# Patient Record
Sex: Female | Born: 1990 | Race: White | Hispanic: No | Marital: Single | Smoking: Current every day smoker
Health system: Southern US, Community
[De-identification: ages and names within clinical notes are randomized; demographics above are authoritative.]

## PROBLEM LIST (undated history)

## (undated) ENCOUNTER — Inpatient Hospital Stay (HOSPITAL_COMMUNITY): Payer: Self-pay

## (undated) DIAGNOSIS — IMO0002 Reserved for concepts with insufficient information to code with codable children: Secondary | ICD-10-CM

## (undated) DIAGNOSIS — N151 Renal and perinephric abscess: Secondary | ICD-10-CM

## (undated) DIAGNOSIS — R809 Proteinuria, unspecified: Secondary | ICD-10-CM

## (undated) DIAGNOSIS — R569 Unspecified convulsions: Secondary | ICD-10-CM

## (undated) DIAGNOSIS — R87619 Unspecified abnormal cytological findings in specimens from cervix uteri: Secondary | ICD-10-CM

## (undated) DIAGNOSIS — N83209 Unspecified ovarian cyst, unspecified side: Secondary | ICD-10-CM

## (undated) DIAGNOSIS — N3010 Interstitial cystitis (chronic) without hematuria: Secondary | ICD-10-CM

## (undated) DIAGNOSIS — O2340 Unspecified infection of urinary tract in pregnancy, unspecified trimester: Secondary | ICD-10-CM

## (undated) DIAGNOSIS — B951 Streptococcus, group B, as the cause of diseases classified elsewhere: Secondary | ICD-10-CM

## (undated) HISTORY — DX: Unspecified convulsions: R56.9

## (undated) HISTORY — DX: Unspecified infection of urinary tract in pregnancy, unspecified trimester: O23.40

## (undated) HISTORY — DX: Interstitial cystitis (chronic) without hematuria: N30.10

## (undated) HISTORY — PX: DILATION AND CURETTAGE OF UTERUS: SHX78

## (undated) HISTORY — DX: Streptococcus, group b, as the cause of diseases classified elsewhere: B95.1

## (undated) NOTE — *Deleted (*Deleted)
Pt agitated, running around, wanted to go to use commode in rest room,, In the rest room, adamant to take shower, with EEGs on. Convince pt tp go back to the bed. Talking non sensible things... want to video call. Not able to communicate to whom,, some video night.  Given ativan. Monitoring the pt closely.

---
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
## 2008-01-06 ENCOUNTER — Emergency Department (HOSPITAL_COMMUNITY): Admission: EM | Admit: 2008-01-06 | Discharge: 2008-01-06 | Payer: Self-pay | Admitting: Emergency Medicine

## 2008-05-04 ENCOUNTER — Emergency Department (HOSPITAL_COMMUNITY): Admission: EM | Admit: 2008-05-04 | Discharge: 2008-05-04 | Payer: Self-pay | Admitting: Emergency Medicine

## 2008-07-31 ENCOUNTER — Telehealth: Payer: Self-pay | Admitting: Sports Medicine

## 2008-07-31 ENCOUNTER — Telehealth: Payer: Self-pay | Admitting: *Deleted

## 2008-07-31 ENCOUNTER — Encounter: Payer: Self-pay | Admitting: Obstetrics & Gynecology

## 2008-07-31 ENCOUNTER — Ambulatory Visit (HOSPITAL_COMMUNITY): Admission: RE | Admit: 2008-07-31 | Discharge: 2008-07-31 | Payer: Self-pay | Admitting: Obstetrics & Gynecology

## 2008-10-24 ENCOUNTER — Emergency Department (HOSPITAL_COMMUNITY): Admission: EM | Admit: 2008-10-24 | Discharge: 2008-10-24 | Payer: Self-pay | Admitting: Emergency Medicine

## 2008-10-31 ENCOUNTER — Emergency Department (HOSPITAL_COMMUNITY): Admission: EM | Admit: 2008-10-31 | Discharge: 2008-10-31 | Payer: Self-pay | Admitting: Emergency Medicine

## 2008-11-06 ENCOUNTER — Telehealth: Payer: Self-pay | Admitting: Sports Medicine

## 2008-11-11 ENCOUNTER — Emergency Department (HOSPITAL_COMMUNITY): Admission: EM | Admit: 2008-11-11 | Discharge: 2008-11-11 | Payer: Self-pay | Admitting: Emergency Medicine

## 2008-12-17 ENCOUNTER — Telehealth: Payer: Self-pay | Admitting: Sports Medicine

## 2008-12-25 ENCOUNTER — Emergency Department (HOSPITAL_COMMUNITY): Admission: EM | Admit: 2008-12-25 | Discharge: 2008-12-25 | Payer: Self-pay | Admitting: Emergency Medicine

## 2009-01-21 ENCOUNTER — Emergency Department (HOSPITAL_COMMUNITY): Admission: EM | Admit: 2009-01-21 | Discharge: 2009-01-21 | Payer: Self-pay | Admitting: Emergency Medicine

## 2009-02-06 ENCOUNTER — Emergency Department (HOSPITAL_COMMUNITY): Admission: EM | Admit: 2009-02-06 | Discharge: 2009-02-06 | Payer: Self-pay | Admitting: Emergency Medicine

## 2009-02-27 ENCOUNTER — Emergency Department (HOSPITAL_COMMUNITY): Admission: EM | Admit: 2009-02-27 | Discharge: 2009-02-27 | Payer: Self-pay | Admitting: Emergency Medicine

## 2009-02-28 ENCOUNTER — Emergency Department (HOSPITAL_COMMUNITY): Admission: EM | Admit: 2009-02-28 | Discharge: 2009-02-28 | Payer: Self-pay | Admitting: Emergency Medicine

## 2009-03-07 ENCOUNTER — Emergency Department (HOSPITAL_COMMUNITY): Admission: EM | Admit: 2009-03-07 | Discharge: 2009-03-07 | Payer: Self-pay | Admitting: Emergency Medicine

## 2009-03-07 ENCOUNTER — Emergency Department (HOSPITAL_COMMUNITY): Admission: EM | Admit: 2009-03-07 | Discharge: 2009-03-08 | Payer: Self-pay | Admitting: Emergency Medicine

## 2009-03-20 ENCOUNTER — Encounter: Admission: RE | Admit: 2009-03-20 | Discharge: 2009-03-20 | Payer: Self-pay | Admitting: Obstetrics & Gynecology

## 2009-03-24 ENCOUNTER — Telehealth (INDEPENDENT_AMBULATORY_CARE_PROVIDER_SITE_OTHER): Payer: Self-pay | Admitting: *Deleted

## 2009-03-26 ENCOUNTER — Emergency Department (HOSPITAL_COMMUNITY): Admission: EM | Admit: 2009-03-26 | Discharge: 2009-03-26 | Payer: Self-pay | Admitting: Emergency Medicine

## 2009-04-10 ENCOUNTER — Emergency Department (HOSPITAL_COMMUNITY): Admission: EM | Admit: 2009-04-10 | Discharge: 2009-04-10 | Payer: Self-pay | Admitting: Emergency Medicine

## 2009-04-18 ENCOUNTER — Emergency Department (HOSPITAL_COMMUNITY): Admission: EM | Admit: 2009-04-18 | Discharge: 2009-04-18 | Payer: Self-pay | Admitting: Emergency Medicine

## 2009-05-05 ENCOUNTER — Emergency Department (HOSPITAL_COMMUNITY): Admission: EM | Admit: 2009-05-05 | Discharge: 2009-05-05 | Payer: Self-pay | Admitting: Emergency Medicine

## 2009-05-13 ENCOUNTER — Telehealth: Payer: Self-pay | Admitting: Sports Medicine

## 2009-05-19 ENCOUNTER — Emergency Department (HOSPITAL_COMMUNITY): Admission: EM | Admit: 2009-05-19 | Discharge: 2009-05-19 | Payer: Self-pay | Admitting: Emergency Medicine

## 2009-05-22 ENCOUNTER — Emergency Department (HOSPITAL_COMMUNITY): Admission: EM | Admit: 2009-05-22 | Discharge: 2009-05-22 | Payer: Self-pay | Admitting: Emergency Medicine

## 2009-06-05 ENCOUNTER — Emergency Department (HOSPITAL_COMMUNITY): Admission: EM | Admit: 2009-06-05 | Discharge: 2009-06-05 | Payer: Self-pay | Admitting: Emergency Medicine

## 2009-06-08 ENCOUNTER — Emergency Department (HOSPITAL_COMMUNITY): Admission: EM | Admit: 2009-06-08 | Discharge: 2009-06-09 | Payer: Self-pay | Admitting: Emergency Medicine

## 2009-07-04 ENCOUNTER — Emergency Department (HOSPITAL_COMMUNITY): Admission: EM | Admit: 2009-07-04 | Discharge: 2009-07-04 | Payer: Self-pay | Admitting: Emergency Medicine

## 2009-07-17 ENCOUNTER — Emergency Department: Payer: Self-pay | Admitting: Emergency Medicine

## 2009-08-13 ENCOUNTER — Emergency Department: Payer: Self-pay | Admitting: Emergency Medicine

## 2009-08-17 ENCOUNTER — Emergency Department (HOSPITAL_COMMUNITY): Admission: EM | Admit: 2009-08-17 | Discharge: 2009-08-17 | Payer: Self-pay | Admitting: Emergency Medicine

## 2009-09-12 ENCOUNTER — Emergency Department: Payer: Self-pay | Admitting: Emergency Medicine

## 2009-10-07 ENCOUNTER — Emergency Department: Payer: Self-pay | Admitting: Emergency Medicine

## 2009-12-04 ENCOUNTER — Emergency Department: Payer: Self-pay | Admitting: Emergency Medicine

## 2009-12-12 ENCOUNTER — Emergency Department (HOSPITAL_COMMUNITY): Admission: EM | Admit: 2009-12-12 | Discharge: 2009-12-13 | Payer: Self-pay | Admitting: Emergency Medicine

## 2010-04-06 NOTE — Progress Notes (Signed)
 Summary: refill  Phone Note Call from Patient Call back at Home Phone [phone]   Caller: Patient Summary of Call: pt needs Hydrocordisone cream for her flair up of her skin prob on eye. [facility] [address] Initial call taken by: Karna Seminole,  November 06, 2008 11:19 AM  Follow-up for Phone Call        sent to pcp Follow-up by: Ginnie Mau RN,  November 06, 2008 11:27 AM  Additional Follow-up for Phone Call Additional follow up Details #1::        I need to see her before approving this refill, may have to change medication.  She could see me Tuesday. Additional Follow-up by: Debby Petties MD,  November 06, 2008 8:59 PM     Appended Document: refill gave her number to [facility]. unable to pay for visit. states she will wait to see if she qualifies  Appended Document: refill I will refill a small tube but she needs to go through [place] and come to see me when she is certified. -Dr. ONEIDA.   Prescriptions: HYDROCORTISONE 2.5 % CREA (HYDROCORTISONE) Apply to affected area two times a day and continue one week after resolution of symptoms.  #15g tube x 0   Entered and Authorized by:   Debby Petties MD   Signed by:   Debby Petties MD on 11/07/2008   Method used:   [facility]        [facility]  [address] [phone]* (retail)       [address]       [place], [place]  [address]       Ph: [phone]       Fax: [phone]   RxID:   [id number]     Appended Document: refill pt notified

## 2010-04-06 NOTE — Progress Notes (Signed)
Summary: Rx Prob  Phone Note Call from Patient Call back at Home Phone [phone]   Caller: Patient Summary of Call: Says that her rx for Tramadol is for only 90 and she takes 4 aday. Initial call taken by: Clydell Hakim,  May 13, 2009 10:31 AM  Follow-up for Phone Call        will forward to MD to advise . Follow-up by: Theresia Lo RN,  May 13, 2009 12:23 PM  Additional Follow-up for Phone Call Additional follow up Details #1::        Sent in 120x3, no more ultram after June 2011 Additional Follow-up by: Rodney Langton MD,  May 17, 2009 2:23 PM    Additional Follow-up for Phone Call Additional follow up Details #2::     message from MD left on voicemail. Follow-up by: Theresia Lo RN,  May 18, 2009 9:45 AM  Prescriptions: ULTRAM 50 MG TABS (TRAMADOL HCL) No more than 4 tabs a day for back pain.  #120 x 3   Entered and Authorized by:   Rodney Langton MD   Signed by:   Rodney Langton MD on 05/17/2009   Method used:   [facility]        [facility]  [address] [phone][facility] (retail)       [address]       [place], [place]  [address]       Ph: [phone]       Fax: [phone]   RxID:   [id number]

## 2010-04-06 NOTE — Progress Notes (Signed)
Summary: Rx Req  Phone Note Refill Request Call back at [facility] Phone [phone] Message from:  Patient  Refills Requested: Medication #1:  ULTRAM 50 MG TABS No more than 4 tabs a day for back pain. [facility] [address]  Initial call taken by: Clydell Hakim,  March 24, 2009 12:18 PM  Follow-up for Phone Call        will forward to MD. Follow-up by: Theresia Lo RN,  March 24, 2009 12:19 PM  Additional Follow-up for Phone Call Additional follow up Details #1::        Please notify patient that medicine will not be refilled until the end of the month Thanks Additional Follow-up by: Rodney Langton MD,  March 28, 2009 8:12 PM    Additional Follow-up for Phone Call Additional follow up Details #2::    patient notified. Follow-up by: Theresia Lo RN,  March 30, 2009 9:59 AM  Prescriptions: ULTRAM 50 MG TABS (TRAMADOL HCL) No more than 4 tabs a day for back pain.  #120 x 0   Entered and Authorized by:   Rodney Langton MD   Signed by:   Rodney Langton MD on 03/28/2009   Method used:   [facility]ically [facility]        [facility]  [address] [phone]* (retail)       [address]       [place], [place]  [address]       Ph: [phone]       Fax: [phone]   RxID:   [id number]

## 2010-04-06 NOTE — Progress Notes (Signed)
 Summary: Triage  Phone Note Call from Patient   Caller: Patient Summary of Call: the refill that we did this morninng needs to be for the 200mg  not the 100mg  of the ultram . Initial call taken by: Madelin Daring,  Jul 31, 2008 1:44 PM  Follow-up for Phone Call        told her the med list shows 100mg . d/c med shows 200mg . told her md will have to approve before it can be refilled. will call when done Follow-up by: Ginnie Mau RN,  Jul 31, 2008 1:55 PM  Additional Follow-up for Phone Call Additional follow up Details #1::        Refilled the 200mg  form.  Please let her know.   Thanks. Additional Follow-up by: Debby Petties MD,  Jul 31, 2008 8:50 PM      Prescriptions: ULTRAM  ER 200 MG  TB24 (TRAMADOL  HCL) take on tablet in the morning  #30 x 6   Entered and Authorized by:   Debby Petties MD   Signed by:   Debby Petties MD on 07/31/2008   Method used:   [facility]        [facility]  [address] [phone]* (retail)       [address]       [place], [place]  [address]       Ph: [phone]       Fax: [phone]   RxID:   [id number]   Appended Document: Triage pt notified.

## 2010-04-06 NOTE — Progress Notes (Signed)
 Summary: refill prob  Phone Note Refill Request Call back at Home Phone [phone] Message from:  Patient  Refills Requested: Medication #1:  ULTRAM  50 MG TABS Two tabs in the morning needs to talk to nurse - having to take 4-6  Initial call taken by: Karna Seminole,  December 17, 2008 9:44 AM  Follow-up for Phone Call        states she is working 7 days a week & that is why her back is hurting. rates pain 5/10. meds really help. needs refill today if possible. told her I will send to pcp & call her with response Follow-up by: Ginnie Mau RN,  December 17, 2008 10:03 AM    Prescriptions: ULTRAM  50 MG TABS (TRAMADOL  HCL) Two tabs in the morning, then one tab by mouth q4-6h as needed for back pain.  #60 x 6   Entered and Authorized by:   Debby Petties MD   Signed by:   Debby Petties MD on 12/18/2008   Method used:   [facility]        [facility]  [address] [phone]* (retail)       [address]       [place], [place]  [address]       Ph: [phone]       Fax: [phone]   RxID:   [id number]   Appended Document: refill prob pt informed. states she is going to make an appt to speak with md. states she went from 200 mg tabs to 50 & this is not enough to hold her

## 2010-05-20 LAB — URINE CULTURE: Culture  Setup Time: 201110091205

## 2010-05-20 LAB — URINALYSIS, ROUTINE W REFLEX MICROSCOPIC
Bilirubin Urine: NEGATIVE
Glucose, UA: NEGATIVE mg/dL
Protein, ur: NEGATIVE mg/dL
Urobilinogen, UA: 1 mg/dL (ref 0.0–1.0)

## 2010-05-26 LAB — URINALYSIS, ROUTINE W REFLEX MICROSCOPIC
Glucose, UA: NEGATIVE mg/dL
Leukocytes, UA: NEGATIVE
Nitrite: NEGATIVE
Protein, ur: NEGATIVE mg/dL
Specific Gravity, Urine: >1.03 — ABNORMAL HIGH (ref 1.005–1.030)
pH: 6.5 (ref 5.0–8.0)
pH: 6 (ref 5.0–8.0)

## 2010-05-26 LAB — WET PREP, GENITAL
WBC, Wet Prep HPF POC: NONE SEEN
WBC, Wet Prep HPF POC: NONE SEEN
Yeast Wet Prep HPF POC: NONE SEEN

## 2010-05-26 LAB — CBC
MCHC: 35 g/dL (ref 30.0–36.0)
Platelets: 199 10*3/uL (ref 150–400)
RDW: 12.2 % (ref 11.5–15.5)

## 2010-05-26 LAB — URINE MICROSCOPIC-ADD ON

## 2010-05-26 LAB — BASIC METABOLIC PANEL
BUN: 7 mg/dL (ref 6–23)
CO2: 25 mEq/L (ref 19–32)
Calcium: 9.4 mg/dL (ref 8.4–10.5)
GFR calc non Af Amer: >60 mL/min (ref >60)
Glucose, Bld: 128 mg/dL — ABNORMAL HIGH (ref 70–99)

## 2010-05-26 LAB — DIFFERENTIAL
Basophils Absolute: 0 10*3/uL (ref 0.0–0.1)
Basophils Relative: 1 % (ref 0–1)
Eosinophils Relative: 1 % (ref 0–5)
Lymphocytes Relative: 35 % (ref 12–46)
Neutro Abs: 3.2 10*3/uL (ref 1.7–7.7)

## 2010-05-26 LAB — PREGNANCY, URINE: Preg Test, Ur: NEGATIVE

## 2010-05-27 LAB — URINE MICROSCOPIC-ADD ON

## 2010-05-27 LAB — DIFFERENTIAL
Basophils Absolute: 0 10*3/uL (ref 0.0–0.1)
Basophils Relative: 1 % (ref 0–1)
Eosinophils Absolute: 0.1 10*3/uL (ref 0.0–0.7)
Neutro Abs: 3.2 10*3/uL (ref 1.7–7.7)
Neutrophils Relative %: 53 % (ref 43–77)

## 2010-05-27 LAB — URINALYSIS, ROUTINE W REFLEX MICROSCOPIC
Bilirubin Urine: NEGATIVE
Glucose, UA: NEGATIVE mg/dL
Protein, ur: NEGATIVE mg/dL

## 2010-05-27 LAB — GC/CHLAMYDIA PROBE AMP, GENITAL: GC Probe Amp, Genital: NEGATIVE

## 2010-05-27 LAB — WET PREP, GENITAL: Trich, Wet Prep: NONE SEEN

## 2010-05-27 LAB — CBC
MCHC: 35 g/dL (ref 30.0–36.0)
MCV: 101.6 fL — ABNORMAL HIGH (ref 78.0–100.0)
Platelets: 161 10*3/uL (ref 150–400)
WBC: 6.1 10*3/uL (ref 4.0–10.5)

## 2010-05-30 LAB — URINALYSIS, ROUTINE W REFLEX MICROSCOPIC
Glucose, UA: NEGATIVE mg/dL
Ketones, ur: NEGATIVE mg/dL
Leukocytes, UA: NEGATIVE
Nitrite: NEGATIVE
Specific Gravity, Urine: 1.03 (ref 1.005–1.030)
pH: 5.5 (ref 5.0–8.0)

## 2010-05-30 LAB — URINE CULTURE

## 2010-05-30 LAB — PREGNANCY, URINE: Preg Test, Ur: NEGATIVE

## 2010-05-30 LAB — URINE MICROSCOPIC-ADD ON

## 2010-05-31 LAB — URINE MICROSCOPIC-ADD ON

## 2010-05-31 LAB — URINALYSIS, ROUTINE W REFLEX MICROSCOPIC
Bilirubin Urine: NEGATIVE
Nitrite: NEGATIVE
Specific Gravity, Urine: 1.004 — ABNORMAL LOW (ref 1.005–1.030)
Urobilinogen, UA: 0.2 mg/dL (ref 0.0–1.0)
pH: 6 (ref 5.0–8.0)

## 2010-06-07 LAB — CBC
MCV: 98.9 fL (ref 78.0–100.0)
Platelets: 190 10*3/uL (ref 150–400)
WBC: 5.5 10*3/uL (ref 4.0–10.5)

## 2010-06-07 LAB — DIFFERENTIAL
Eosinophils Absolute: 0 10*3/uL (ref 0.0–0.7)
Lymphocytes Relative: 28 % (ref 12–46)
Lymphs Abs: 1.5 10*3/uL (ref 0.7–4.0)
Neutro Abs: 3.6 10*3/uL (ref 1.7–7.7)
Neutrophils Relative %: 66 % (ref 43–77)

## 2010-06-07 LAB — WET PREP, GENITAL
Trich, Wet Prep: NONE SEEN
WBC, Wet Prep HPF POC: NONE SEEN

## 2010-06-07 LAB — BASIC METABOLIC PANEL
BUN: 5 mg/dL — ABNORMAL LOW (ref 6–23)
Calcium: 9 mg/dL (ref 8.4–10.5)
Creatinine, Ser: 0.59 mg/dL (ref 0.4–1.2)
GFR calc Af Amer: >60 mL/min (ref >60)
GFR calc non Af Amer: >60 mL/min (ref >60)

## 2010-06-07 LAB — URINALYSIS, ROUTINE W REFLEX MICROSCOPIC
Bilirubin Urine: NEGATIVE
Glucose, UA: NEGATIVE mg/dL
Ketones, ur: NEGATIVE mg/dL
Protein, ur: NEGATIVE mg/dL
pH: 6.5 (ref 5.0–8.0)

## 2010-06-07 LAB — GC/CHLAMYDIA PROBE AMP, GENITAL
Chlamydia, DNA Probe: NEGATIVE
GC Probe Amp, Genital: NEGATIVE

## 2010-06-08 LAB — URINALYSIS, ROUTINE W REFLEX MICROSCOPIC
Bilirubin Urine: NEGATIVE
Specific Gravity, Urine: 1.023 (ref 1.005–1.030)
Urobilinogen, UA: 0.2 mg/dL (ref 0.0–1.0)

## 2010-06-08 LAB — WET PREP, GENITAL
Clue Cells Wet Prep HPF POC: NONE SEEN
Trich, Wet Prep: NONE SEEN
Yeast Wet Prep HPF POC: NONE SEEN

## 2010-06-08 LAB — PREGNANCY, URINE: Preg Test, Ur: NEGATIVE

## 2010-06-08 LAB — GC/CHLAMYDIA PROBE AMP, GENITAL: GC Probe Amp, Genital: NEGATIVE

## 2010-06-08 LAB — URINE MICROSCOPIC-ADD ON

## 2010-06-09 LAB — URINALYSIS, ROUTINE W REFLEX MICROSCOPIC
Ketones, ur: NEGATIVE mg/dL
Nitrite: NEGATIVE
Urobilinogen, UA: 1 mg/dL (ref 0.0–1.0)

## 2010-06-09 LAB — POCT I-STAT, CHEM 8
BUN: 5 mg/dL — ABNORMAL LOW (ref 6–23)
Creatinine, Ser: 0.6 mg/dL (ref 0.4–1.2)
Potassium: 3.3 mEq/L — ABNORMAL LOW (ref 3.5–5.1)
Sodium: 141 mEq/L (ref 135–145)

## 2010-06-09 LAB — PREGNANCY, URINE: Preg Test, Ur: NEGATIVE

## 2010-06-10 LAB — COMPREHENSIVE METABOLIC PANEL
ALT: 8 U/L (ref 0–35)
AST: 13 U/L (ref 0–37)
CO2: 28 mEq/L (ref 19–32)
Chloride: 105 mEq/L (ref 96–112)
Creatinine, Ser: 0.63 mg/dL (ref 0.4–1.2)
GFR calc Af Amer: >60 mL/min (ref >60)
GFR calc non Af Amer: >60 mL/min (ref >60)
Total Bilirubin: 0.7 mg/dL (ref 0.3–1.2)

## 2010-06-10 LAB — DIFFERENTIAL
Basophils Absolute: 0 10*3/uL (ref 0.0–0.1)
Basophils Relative: 0 % (ref 0–1)
Eosinophils Absolute: 0 10*3/uL (ref 0.0–0.7)
Eosinophils Relative: 0 % (ref 0–5)
Lymphocytes Relative: 18 % (ref 12–46)

## 2010-06-10 LAB — URINALYSIS, ROUTINE W REFLEX MICROSCOPIC
Bilirubin Urine: NEGATIVE
Glucose, UA: NEGATIVE mg/dL
Protein, ur: >300 mg/dL — AB

## 2010-06-10 LAB — CBC
HCT: 39.3 % (ref 36.0–46.0)
MCV: 98.4 fL (ref 78.0–100.0)
RBC: 3.99 MIL/uL (ref 3.87–5.11)
WBC: 6.6 10*3/uL (ref 4.0–10.5)

## 2010-06-10 LAB — PREGNANCY, URINE: Preg Test, Ur: NEGATIVE

## 2010-06-10 LAB — URINE MICROSCOPIC-ADD ON

## 2010-06-12 LAB — GC/CHLAMYDIA PROBE AMP, GENITAL
Chlamydia, DNA Probe: NEGATIVE
GC Probe Amp, Genital: NEGATIVE

## 2010-06-12 LAB — POCT I-STAT, CHEM 8
Calcium, Ion: 1.23 mmol/L (ref 1.12–1.32)
Chloride: 104 mEq/L (ref 96–112)
Creatinine, Ser: 0.7 mg/dL (ref 0.4–1.2)
Glucose, Bld: 92 mg/dL (ref 70–99)
HCT: 45 % (ref 36.0–46.0)

## 2010-06-12 LAB — COMPREHENSIVE METABOLIC PANEL WITH GFR
BUN: 5 mg/dL — ABNORMAL LOW (ref 6–23)
CO2: 24 meq/L (ref 19–32)
Calcium: 9.2 mg/dL (ref 8.4–10.5)
Creatinine, Ser: 0.62 mg/dL (ref 0.4–1.2)
GFR calc non Af Amer: >60 mL/min (ref >60)
Glucose, Bld: 91 mg/dL (ref 70–99)

## 2010-06-12 LAB — URINE MICROSCOPIC-ADD ON

## 2010-06-12 LAB — COMPREHENSIVE METABOLIC PANEL
ALT: <8 U/L (ref 0–35)
AST: 22 U/L (ref 0–37)
Albumin: 4.1 g/dL (ref 3.5–5.2)
Alkaline Phosphatase: 46 U/L (ref 39–117)
Chloride: 110 mEq/L (ref 96–112)
GFR calc Af Amer: >60 mL/min (ref >60)
Potassium: 3.8 mEq/L (ref 3.5–5.1)
Sodium: 141 mEq/L (ref 135–145)
Total Bilirubin: 0.5 mg/dL (ref 0.3–1.2)
Total Protein: 6.7 g/dL (ref 6.0–8.3)

## 2010-06-12 LAB — RAPID URINE DRUG SCREEN, HOSP PERFORMED
Amphetamines: NOT DETECTED
Barbiturates: NOT DETECTED
Benzodiazepines: POSITIVE — AB
Cocaine: NOT DETECTED
Opiates: POSITIVE — AB
Tetrahydrocannabinol: POSITIVE — AB

## 2010-06-12 LAB — URINALYSIS, ROUTINE W REFLEX MICROSCOPIC
Bilirubin Urine: NEGATIVE
Glucose, UA: NEGATIVE mg/dL
Glucose, UA: NEGATIVE mg/dL
Ketones, ur: NEGATIVE mg/dL
Leukocytes, UA: NEGATIVE
Nitrite: NEGATIVE
Protein, ur: NEGATIVE mg/dL
Specific Gravity, Urine: 1.002 — ABNORMAL LOW (ref 1.005–1.030)
Specific Gravity, Urine: 1.014 (ref 1.005–1.030)
Urobilinogen, UA: 0.2 mg/dL (ref 0.0–1.0)
pH: 6 (ref 5.0–8.0)
pH: 6.5 (ref 5.0–8.0)

## 2010-06-12 LAB — DIFFERENTIAL
Basophils Absolute: 0 K/uL (ref 0.0–0.1)
Basophils Relative: 1 % (ref 0–1)
Basophils Relative: 1 % (ref 0–1)
Eosinophils Absolute: 0 10*3/uL (ref 0.0–0.7)
Eosinophils Absolute: 0.1 10*3/uL (ref 0.0–0.7)
Eosinophils Relative: 1 % (ref 0–5)
Lymphocytes Relative: 36 % (ref 12–46)
Lymphs Abs: 1.5 K/uL (ref 0.7–4.0)
Lymphs Abs: 1.8 10*3/uL (ref 0.7–4.0)
Monocytes Absolute: 0.2 10*3/uL (ref 0.1–1.0)
Monocytes Absolute: 0.3 10*3/uL (ref 0.1–1.0)
Monocytes Relative: 5 % (ref 3–12)
Monocytes Relative: 6 % (ref 3–12)
Neutro Abs: 2.4 10*3/uL (ref 1.7–7.7)
Neutro Abs: 2.5 10*3/uL (ref 1.7–7.7)
Neutrophils Relative %: 56 % (ref 43–77)

## 2010-06-12 LAB — CBC
HCT: 39.5 % (ref 36.0–46.0)
Hemoglobin: 13.5 g/dL (ref 12.0–15.0)
Hemoglobin: 14.3 g/dL (ref 12.0–15.0)
MCHC: 34.1 g/dL (ref 30.0–36.0)
MCHC: 34.7 g/dL (ref 30.0–36.0)
MCV: 97.7 fL (ref 78.0–100.0)
MCV: 98.8 fL (ref 78.0–100.0)
Platelets: 160 10*3/uL (ref 150–400)
RBC: 4 MIL/uL (ref 3.87–5.11)
RBC: 4.21 MIL/uL (ref 3.87–5.11)
RDW: 12.1 % (ref 11.5–15.5)
WBC: 4.3 10*3/uL (ref 4.0–10.5)
WBC: 4.5 10*3/uL (ref 4.0–10.5)

## 2010-06-12 LAB — WET PREP, GENITAL
Clue Cells Wet Prep HPF POC: NONE SEEN
Trich, Wet Prep: NONE SEEN
Trich, Wet Prep: NONE SEEN
Yeast Wet Prep HPF POC: NONE SEEN
Yeast Wet Prep HPF POC: NONE SEEN

## 2010-06-12 LAB — POCT PREGNANCY, URINE
Preg Test, Ur: NEGATIVE
Preg Test, Ur: NEGATIVE

## 2010-06-15 LAB — DIFFERENTIAL
Basophils Absolute: 0 10*3/uL (ref 0.0–0.1)
Basophils Relative: 0 % (ref 0–1)
Eosinophils Absolute: 0 10*3/uL (ref 0.0–0.7)
Lymphs Abs: 1.6 10*3/uL (ref 0.7–4.0)
Neutrophils Relative %: 70 % (ref 43–77)

## 2010-06-15 LAB — CBC
MCV: 97.7 fL (ref 78.0–100.0)
Platelets: 188 10*3/uL (ref 150–400)
RDW: 12.4 % (ref 11.5–15.5)
WBC: 6.3 10*3/uL (ref 4.0–10.5)

## 2010-06-22 LAB — URINE MICROSCOPIC-ADD ON

## 2010-06-22 LAB — URINALYSIS, ROUTINE W REFLEX MICROSCOPIC
Bilirubin Urine: NEGATIVE
Glucose, UA: NEGATIVE mg/dL
Protein, ur: NEGATIVE mg/dL
Urobilinogen, UA: 0.2 mg/dL (ref 0.0–1.0)

## 2010-07-20 NOTE — Op Note (Signed)
NAMEMAGARET, Amber                 ACCOUNT NO.:  [id number]   MEDICAL RECORD NO.:  [id number]          PATIENT TYPE:  AMB   LOCATION:  DAY                           FACILITY:  [facility]   PHYSICIAN:  Lazaro Arms, M.D.   DATE OF BIRTH:  May 01, 1990   DATE OF PROCEDURE:  07/31/2008  DATE OF DISCHARGE:  07/31/2008                               OPERATIVE REPORT   PREOPERATIVE DIAGNOSIS:  Missed abortion in the first trimester.   POSTOPERATIVE DIAGNOSIS:  Missed abortion in the first trimester.   PROCEDURE:  Suction and sharp uterine curettage.   SURGEON:  Lazaro Arms, M.D.   ANESTHESIA:  Laryngeal mask airway.   FINDINGS:  The patient was known to have a miscarriage in the office  room 2 weeks ago.  We did a Cytotec per vagina twice and neither time,  resulted in spontaneous miscarriage.  As a result, she was admitted for  D&C.   The patient was taken to the operating room, placed in the supine  position where she underwent laryngeal mask airway, placed in lithotomy  position, prepped and draped in the usual fashion.  Her cervix had  already dilated enough to allow passage of a non-curved past conception  removed under suction curettage, a general sharp curettage was then  performed, all parts of conception removed.  There was good uterine cry  in all areas.  There was minimal bleeding.  She was given Methergine IM.  She was awakened from anesthesia and taken to recovery room in good and  stable condition.  All counts correct.      Lazaro Arms, M.D.  Electronically Signed     LHE/MEDQ  D:  07/31/2008  T:  08/01/2008  Job:  [id number]

## 2010-10-27 ENCOUNTER — Emergency Department (HOSPITAL_COMMUNITY)
Admission: EM | Admit: 2010-10-27 | Discharge: 2010-10-27 | Disposition: A | Discharge status: Home / Self Care | Payer: Self-pay | Attending: Emergency Medicine | Admitting: Emergency Medicine

## 2010-10-27 DIAGNOSIS — N946 Dysmenorrhea, unspecified: Secondary | ICD-10-CM | POA: Insufficient documentation

## 2010-10-27 DIAGNOSIS — R109 Unspecified abdominal pain: Secondary | ICD-10-CM | POA: Insufficient documentation

## 2010-10-27 LAB — WET PREP, GENITAL: Yeast Wet Prep HPF POC: NONE SEEN

## 2010-10-27 LAB — DIFFERENTIAL
Basophils Absolute: 0 10*3/uL (ref 0.0–0.1)
Basophils Relative: 0 % (ref 0–1)
Lymphocytes Relative: 13 % (ref 12–46)
Neutro Abs: 8.4 10*3/uL — ABNORMAL HIGH (ref 1.7–7.7)
Neutrophils Relative %: 84 % — ABNORMAL HIGH (ref 43–77)

## 2010-10-27 LAB — CBC
Hemoglobin: 12.2 g/dL (ref 12.0–15.0)
RBC: 3.65 MIL/uL — ABNORMAL LOW (ref 3.87–5.11)

## 2010-10-27 LAB — URINALYSIS, ROUTINE W REFLEX MICROSCOPIC
Glucose, UA: NEGATIVE mg/dL
Specific Gravity, Urine: 1.029 (ref 1.005–1.030)
Urobilinogen, UA: 1 mg/dL (ref 0.0–1.0)
pH: 6 (ref 5.0–8.0)

## 2010-10-27 LAB — BASIC METABOLIC PANEL
CO2: 27 mEq/L (ref 19–32)
Chloride: 102 mEq/L (ref 96–112)
Glucose, Bld: 114 mg/dL — ABNORMAL HIGH (ref 70–99)
Potassium: 3.3 mEq/L — ABNORMAL LOW (ref 3.5–5.1)
Sodium: 138 mEq/L (ref 135–145)

## 2010-10-27 LAB — PREGNANCY, URINE: Preg Test, Ur: NEGATIVE

## 2010-10-27 LAB — URINE MICROSCOPIC-ADD ON

## 2010-10-28 LAB — RPR: RPR Ser Ql: NONREACTIVE

## 2010-11-12 ENCOUNTER — Emergency Department (HOSPITAL_COMMUNITY)
Admission: EM | Admit: 2010-11-12 | Discharge: 2010-11-12 | Discharge status: Against Medical Advice | Payer: Self-pay | Attending: Emergency Medicine | Admitting: Emergency Medicine

## 2010-11-12 DIAGNOSIS — N898 Other specified noninflammatory disorders of vagina: Secondary | ICD-10-CM | POA: Insufficient documentation

## 2010-11-12 DIAGNOSIS — R109 Unspecified abdominal pain: Secondary | ICD-10-CM | POA: Insufficient documentation

## 2010-12-06 ENCOUNTER — Emergency Department (HOSPITAL_COMMUNITY)
Admission: EM | Admit: 2010-12-06 | Discharge: 2010-12-06 | Discharge status: Against Medical Advice | Payer: Self-pay | Attending: Emergency Medicine | Admitting: Emergency Medicine

## 2010-12-06 DIAGNOSIS — R109 Unspecified abdominal pain: Secondary | ICD-10-CM | POA: Insufficient documentation

## 2010-12-06 DIAGNOSIS — N739 Female pelvic inflammatory disease, unspecified: Secondary | ICD-10-CM | POA: Insufficient documentation

## 2010-12-06 LAB — URINALYSIS, ROUTINE W REFLEX MICROSCOPIC
Ketones, ur: NEGATIVE mg/dL
Nitrite: NEGATIVE
Protein, ur: NEGATIVE mg/dL
pH: 6 (ref 5.0–8.0)

## 2010-12-06 LAB — WET PREP, GENITAL: Clue Cells Wet Prep HPF POC: NONE SEEN

## 2010-12-06 LAB — POCT PREGNANCY, URINE: Preg Test, Ur: NEGATIVE

## 2010-12-06 LAB — GC/CHLAMYDIA PROBE AMP, GENITAL: Chlamydia, DNA Probe: NEGATIVE

## 2010-12-06 LAB — URINE MICROSCOPIC-ADD ON

## 2010-12-07 LAB — URINALYSIS, ROUTINE W REFLEX MICROSCOPIC
Bilirubin Urine: NEGATIVE
Hgb urine dipstick: NEGATIVE
Ketones, ur: NEGATIVE
Nitrite: POSITIVE — AB
Protein, ur: NEGATIVE
Specific Gravity, Urine: 1.01
Urobilinogen, UA: 1
pH: 6.5

## 2010-12-20 ENCOUNTER — Emergency Department (HOSPITAL_COMMUNITY): Payer: Self-pay

## 2010-12-20 ENCOUNTER — Emergency Department (HOSPITAL_COMMUNITY)
Admission: EM | Admit: 2010-12-20 | Discharge: 2010-12-20 | Disposition: A | Discharge status: Home / Self Care | Payer: Self-pay | Attending: Emergency Medicine | Admitting: Emergency Medicine

## 2010-12-20 DIAGNOSIS — Y9351 Activity, roller skating (inline) and skateboarding: Secondary | ICD-10-CM | POA: Insufficient documentation

## 2010-12-20 DIAGNOSIS — Y92838 Other recreation area as the place of occurrence of the external cause: Secondary | ICD-10-CM | POA: Insufficient documentation

## 2010-12-20 DIAGNOSIS — W010XXA Fall on same level from slipping, tripping and stumbling without subsequent striking against object, initial encounter: Secondary | ICD-10-CM | POA: Insufficient documentation

## 2010-12-20 DIAGNOSIS — R079 Chest pain, unspecified: Secondary | ICD-10-CM | POA: Insufficient documentation

## 2010-12-20 DIAGNOSIS — R059 Cough, unspecified: Secondary | ICD-10-CM | POA: Insufficient documentation

## 2010-12-20 DIAGNOSIS — Y9239 Other specified sports and athletic area as the place of occurrence of the external cause: Secondary | ICD-10-CM | POA: Insufficient documentation

## 2010-12-20 DIAGNOSIS — T1490XA Injury, unspecified, initial encounter: Secondary | ICD-10-CM | POA: Insufficient documentation

## 2010-12-20 DIAGNOSIS — R05 Cough: Secondary | ICD-10-CM | POA: Insufficient documentation

## 2011-03-12 ENCOUNTER — Encounter: Payer: Self-pay | Admitting: *Deleted

## 2011-03-12 ENCOUNTER — Emergency Department (HOSPITAL_COMMUNITY)
Admission: EM | Admit: 2011-03-12 | Discharge: 2011-03-13 | Disposition: A | Discharge status: Home / Self Care | Payer: Self-pay | Attending: Emergency Medicine | Admitting: Emergency Medicine

## 2011-03-12 DIAGNOSIS — R1032 Left lower quadrant pain: Secondary | ICD-10-CM | POA: Insufficient documentation

## 2011-03-12 DIAGNOSIS — R300 Dysuria: Secondary | ICD-10-CM | POA: Insufficient documentation

## 2011-03-12 DIAGNOSIS — F17200 Nicotine dependence, unspecified, uncomplicated: Secondary | ICD-10-CM | POA: Insufficient documentation

## 2011-03-12 DIAGNOSIS — R319 Hematuria, unspecified: Secondary | ICD-10-CM | POA: Insufficient documentation

## 2011-03-12 DIAGNOSIS — Z79899 Other long term (current) drug therapy: Secondary | ICD-10-CM | POA: Insufficient documentation

## 2011-03-12 DIAGNOSIS — N898 Other specified noninflammatory disorders of vagina: Secondary | ICD-10-CM | POA: Insufficient documentation

## 2011-03-12 DIAGNOSIS — R350 Frequency of micturition: Secondary | ICD-10-CM | POA: Insufficient documentation

## 2011-03-12 DIAGNOSIS — R1110 Vomiting, unspecified: Secondary | ICD-10-CM | POA: Insufficient documentation

## 2011-03-12 DIAGNOSIS — N949 Unspecified condition associated with female genital organs and menstrual cycle: Secondary | ICD-10-CM | POA: Insufficient documentation

## 2011-03-12 DIAGNOSIS — N390 Urinary tract infection, site not specified: Secondary | ICD-10-CM | POA: Insufficient documentation

## 2011-03-12 DIAGNOSIS — R21 Rash and other nonspecific skin eruption: Secondary | ICD-10-CM | POA: Insufficient documentation

## 2011-03-12 HISTORY — DX: Interstitial cystitis (chronic) without hematuria: N30.10

## 2011-03-12 LAB — WET PREP, GENITAL
Clue Cells Wet Prep HPF POC: NONE SEEN
Trich, Wet Prep: NONE SEEN
WBC, Wet Prep HPF POC: NONE SEEN
Yeast Wet Prep HPF POC: NONE SEEN

## 2011-03-12 LAB — URINALYSIS, ROUTINE W REFLEX MICROSCOPIC
Glucose, UA: NEGATIVE mg/dL
Specific Gravity, Urine: 1.018 (ref 1.005–1.030)
Urobilinogen, UA: 0.2 mg/dL (ref 0.0–1.0)

## 2011-03-12 LAB — URINE MICROSCOPIC-ADD ON

## 2011-03-12 LAB — PREGNANCY, URINE: Preg Test, Ur: NEGATIVE

## 2011-03-12 MED ORDER — OXYCODONE-ACETAMINOPHEN 5-325 MG PO TABS
1.0000 | ORAL_TABLET | Freq: Once | ORAL | Status: AC
  Administered 2011-03-12: 1 via ORAL
  Filled 2011-03-12: qty 1

## 2011-03-12 NOTE — ED Provider Notes (Signed)
History     CSN: [id number]  Arrival date & time 03/12/11  1731   First MD Initiated Contact with Patient 03/12/11 2112      Chief Complaint  Patient presents with  . Abdominal Pain    Pain in lower abdomen/pelvic area and left groin.     Patient is a 21 y.o. female presenting with abdominal pain.  Abdominal Pain The primary symptoms of the illness include abdominal pain, vomiting, dysuria and vaginal bleeding. The primary symptoms of the illness do not include fever, diarrhea or vaginal discharge. The current episode started 2 days ago. The onset of the illness was gradual. The problem has been gradually worsening.  The dysuria is associated with hematuria and frequency.  The patient states that she believes she is currently not pregnant. The patient has not had a change in bowel habit. Additional symptoms associated with the illness include hematuria and frequency. Symptoms associated with the illness do not include chills.  Patient reports two-day history of LLQ abdominal pain. States pain was so bad at one point that she did throw up. Patient admits to history of chronic pelvic pain including ovarian cyst. Has been on her period since Monday. Denies UTI symptoms, however admits that she does have chronic interstitial cystitis and associated chronic urinary  frequency. States that her 10 mg Percocet are not helping her pain and she has been unable to get her prescription refilled as her physician as not been  in the office due to illness. Denies fever, cough, diarrhea or other symptoms.  Past Medical History  Diagnosis Date  . Interstitial cystitis     Past Surgical History  Procedure Date  . Dilation and curettage of uterus     Family History  Problem Relation Age of Onset  . Cancer Maternal Uncle     History  Substance Use Topics  . Smoking status: Current Some Day Smoker -- 1.0 packs/day    Types: Cigarettes  . Smokeless tobacco: Never Used  . Alcohol Use: Yes     occ      OB History    Grav Para Term Preterm Abortions TAB SAB Ect Mult Living                  Review of Systems  Constitutional: Negative.  Negative for fever and chills.  HENT: Negative.   Eyes: Negative.   Respiratory: Negative.   Cardiovascular: Negative.   Gastrointestinal: Positive for vomiting and abdominal pain. Negative for diarrhea.  Genitourinary: Positive for dysuria, frequency, hematuria and vaginal bleeding. Negative for vaginal discharge.  Musculoskeletal: Negative.   Skin: Negative.   Neurological: Negative.   Hematological: Negative.   Psychiatric/Behavioral: Negative.     Allergies  Biaxin  Home Medications   Current Outpatient Rx  Name Route Sig Dispense Refill  . HYDROCODONE-ACETAMINOPHEN 10-325 MG PO TABS Oral Take 1 tablet by mouth every 6 (six) hours as needed.      Marland Kitchen LORATADINE 10 MG PO TABS Oral Take 10 mg by mouth daily.      Marland Kitchen NORGESTIMATE-ETH ESTRADIOL 0.25-35 MG-MCG PO TABS Oral Take 1 tablet by mouth daily.        BP 109/75  Pulse 115  Temp 99 F (37.2 C)  Resp 18  Wt 115 lb (52.164 kg)  SpO2 100%  LMP 03/07/2011  Physical Exam  Constitutional: She is oriented to person, place, and time. She appears well-developed and well-nourished.  HENT:  Head: Normocephalic and atraumatic.  Eyes: Conjunctivae are normal.  Neck: Neck supple.  Cardiovascular: Normal rate and regular rhythm.   Pulmonary/Chest: Effort normal and breath sounds normal.  Abdominal: Soft. Bowel sounds are normal.  Genitourinary: Vagina normal and uterus normal. There is no rash, tenderness or lesion on the right labia. There is no rash, tenderness or lesion on the left labia. Cervix exhibits no motion tenderness, no discharge and no friability. Right adnexum displays no mass and no fullness. Left adnexum displays no mass and no fullness. No tenderness around the vagina.       Can not appreciate significant TTP to (L) or (R) adnexa.   Musculoskeletal: Normal range of  motion.  Neurological: She is alert and oriented to person, place, and time.  Skin: Skin is warm and dry. Rash noted. Rash is papular. No erythema.  Psychiatric: She has a normal mood and affect.    ED Course  Procedures findings and clinical impression discussed with patient. Patient requesting a refill of her 10 mg Percocet. Explained we could not refill her Percocet in the emergency department  If she is suspicious that her physician may not return to this practice she will need to establish with a new primary care physician. Referrals provided. Will plan for discharge home with treatment for urinary tract infection and encourage PCP followup as soon as can be arranged. I have also discussed with patient that if she is unable to arrange for  follow up within a week to 10 days she can return to an urgent care facility at [facility] to have a urine recheck and assure that her infection has cleared. Patient agreeable with plan. Labs Reviewed  URINALYSIS, ROUTINE W REFLEX MICROSCOPIC - Abnormal; Notable for the following:    APPearance CLOUDY (*)    Hgb urine dipstick LARGE (*)    Protein, ur 100 (*)    Nitrite POSITIVE (*)    Leukocytes, UA MODERATE (*)    All other components within normal limits  URINE MICROSCOPIC-ADD ON - Abnormal; Notable for the following:    Bacteria, UA MANY (*)    All other components within normal limits  PREGNANCY, URINE  WET PREP, GENITAL  GC/CHLAMYDIA PROBE AMP, GENITAL   No results found.   No diagnosis found.    MDM  HPI/PE and clinical findings c/w urinary tract infection Chronic pelvic pain.        Leanne Chang, NP 03/13/11 [phone]

## 2011-03-12 NOTE — ED Notes (Signed)
WUJ:WJ19<JY> Expected date:03/12/11<BR> Expected time: 8:42 PM<BR> Means of arrival:Ambulance<BR> Comments:<BR> EMS 100 GC, 89 yof fall w bruising

## 2011-03-12 NOTE — ED Notes (Signed)
Pt reports hx of interstitial cystitis, worse with mentrual cycles and no improvement in pain with narcotics and 800mg  Ibuprofen so came to ED as well as possible fever.

## 2011-03-13 MED ORDER — NITROFURANTOIN MONOHYD MACRO 100 MG PO CAPS: 100.0000 mg | ORAL_CAPSULE | Freq: Two times a day (BID) | ORAL | Status: AC

## 2011-03-13 NOTE — ED Provider Notes (Signed)
Medical screening examination/treatment/procedure(s) were performed by non-physician practitioner and as supervising physician I was immediately available for consultation/collaboration.   Lyanne Co, MD 03/13/11 [phone]

## 2011-03-13 NOTE — ED Notes (Signed)
Rx given x1 D/c instructions reviewed w/ pt - pt denies any further questions or concerns at present.   

## 2011-03-14 LAB — GC/CHLAMYDIA PROBE AMP, GENITAL
Chlamydia, DNA Probe: NEGATIVE
GC Probe Amp, Genital: NEGATIVE

## 2011-08-05 ENCOUNTER — Encounter (HOSPITAL_COMMUNITY): Payer: Self-pay | Admitting: *Deleted

## 2011-08-05 ENCOUNTER — Emergency Department (HOSPITAL_COMMUNITY)
Admission: EM | Admit: 2011-08-05 | Discharge: 2011-08-05 | Disposition: A | Discharge status: Home / Self Care | Payer: Self-pay | Attending: Emergency Medicine | Admitting: Emergency Medicine

## 2011-08-05 DIAGNOSIS — N72 Inflammatory disease of cervix uteri: Secondary | ICD-10-CM | POA: Insufficient documentation

## 2011-08-05 DIAGNOSIS — R110 Nausea: Secondary | ICD-10-CM | POA: Insufficient documentation

## 2011-08-05 DIAGNOSIS — R10819 Abdominal tenderness, unspecified site: Secondary | ICD-10-CM | POA: Insufficient documentation

## 2011-08-05 DIAGNOSIS — R109 Unspecified abdominal pain: Secondary | ICD-10-CM | POA: Insufficient documentation

## 2011-08-05 LAB — DIFFERENTIAL
Basophils Absolute: 0 10*3/uL (ref 0.0–0.1)
Eosinophils Relative: 4 % (ref 0–5)
Lymphocytes Relative: 41 % (ref 12–46)
Lymphs Abs: 2.4 10*3/uL (ref 0.7–4.0)
Monocytes Absolute: 0.3 10*3/uL (ref 0.1–1.0)
Neutro Abs: 2.9 10*3/uL (ref 1.7–7.7)

## 2011-08-05 LAB — BASIC METABOLIC PANEL
CO2: 27 mEq/L (ref 19–32)
Chloride: 101 mEq/L (ref 96–112)
Glucose, Bld: 80 mg/dL (ref 70–99)
Sodium: 137 mEq/L (ref 135–145)

## 2011-08-05 LAB — URINALYSIS, ROUTINE W REFLEX MICROSCOPIC
Glucose, UA: NEGATIVE mg/dL
Hgb urine dipstick: NEGATIVE
Protein, ur: NEGATIVE mg/dL
Specific Gravity, Urine: 1.025 (ref 1.005–1.030)

## 2011-08-05 LAB — CBC
HCT: 41.3 % (ref 36.0–46.0)
MCV: 98.8 fL (ref 78.0–100.0)
Platelets: 248 10*3/uL (ref 150–400)
RBC: 4.18 MIL/uL (ref 3.87–5.11)
RDW: 12.1 % (ref 11.5–15.5)
WBC: 5.8 10*3/uL (ref 4.0–10.5)

## 2011-08-05 LAB — WET PREP, GENITAL
Trich, Wet Prep: NONE SEEN
Yeast Wet Prep HPF POC: NONE SEEN

## 2011-08-05 LAB — URINE MICROSCOPIC-ADD ON

## 2011-08-05 MED ORDER — MORPHINE SULFATE 4 MG/ML IJ SOLN
4.0000 mg | Freq: Once | INTRAMUSCULAR | Status: AC
  Administered 2011-08-05: 4 mg via INTRAVENOUS
  Filled 2011-08-05: qty 1

## 2011-08-05 MED ORDER — SODIUM CHLORIDE 0.9 % IV SOLN
1000.0000 mL | Freq: Once | INTRAVENOUS | Status: AC
  Administered 2011-08-05: 1000 mL via INTRAVENOUS

## 2011-08-05 MED ORDER — HYDROCODONE-ACETAMINOPHEN 10-325 MG PO TABS: 1.0000 | ORAL_TABLET | Freq: Four times a day (QID) | ORAL | Status: DC | PRN

## 2011-08-05 MED ORDER — DOXYCYCLINE HYCLATE 100 MG PO CAPS: 100.0000 mg | ORAL_CAPSULE | Freq: Two times a day (BID) | ORAL | Status: AC

## 2011-08-05 MED ORDER — ONDANSETRON HCL 4 MG/2ML IJ SOLN
4.0000 mg | Freq: Once | INTRAMUSCULAR | Status: AC
  Administered 2011-08-05: 4 mg via INTRAVENOUS
  Filled 2011-08-05: qty 2

## 2011-08-05 MED ORDER — CEFTRIAXONE SODIUM 250 MG IJ SOLR
250.0000 mg | Freq: Once | INTRAMUSCULAR | Status: AC
  Administered 2011-08-05: 250 mg via INTRAMUSCULAR
  Filled 2011-08-05: qty 250

## 2011-08-05 MED ORDER — SODIUM CHLORIDE 0.9 % IV SOLN
1000.0000 mL | INTRAVENOUS | Status: DC
  Administered 2011-08-05: 1000 mL via INTRAVENOUS

## 2011-08-05 NOTE — ED Provider Notes (Signed)
History    CSN: [id number] Arrival date & time 08/05/11  4098 First MD Initiated Contact with Patient 08/05/11 [phone]    Chief Complaint  Patient presents with  . Nausea  . Abdominal Pain  . Vaginal Discharge    Patient is a 21 y.o. female presenting with abdominal pain and vaginal discharge. The history is provided by the patient.  Abdominal Pain The primary symptoms of the illness include abdominal pain, nausea and vaginal discharge. The primary symptoms of the illness do not include fever, vomiting, diarrhea, dysuria or vaginal bleeding. The current episode started yesterday. The onset of the illness was gradual. The problem has been gradually worsening.  Vaginal discharge is a new problem. The amount of discharge is scant. The color of the discharge is clear and yellow. The vaginal discharge is not associated with dysuria.  The patient states that she believes she is currently not pregnant. The patient has not had a change in bowel habit. Symptoms associated with the illness do not include chills, anorexia, constipation or back pain. Associated medical issues comments: interstitial cystitis.  Vaginal Discharge Associated symptoms include abdominal pain.  Drinking acidic fluid makes it worse.  Pt states this feels similar to the pain with her interstitial cystitis.  She did have urinary frequency yesterday.  She used her last vicodin because her doctor is getting treatment for cancer and his office has been closed.  Past Medical History  Diagnosis Date  . Interstitial cystitis     Past Surgical History  Procedure Date  . Dilation and curettage of uterus     Family History  Problem Relation Age of Onset  . Cancer Maternal Uncle     History  Substance Use Topics  . Smoking status: Current Some Day Smoker -- 1.0 packs/day    Types: Cigarettes  . Smokeless tobacco: Never Used  . Alcohol Use: Yes     occ    OB History    Grav Para Term Preterm Abortions TAB SAB Ect Mult  Living                  Review of Systems  Constitutional: Negative for fever and chills.  Gastrointestinal: Positive for nausea and abdominal pain. Negative for vomiting, diarrhea, constipation and anorexia.  Genitourinary: Positive for vaginal discharge. Negative for dysuria and vaginal bleeding.  Musculoskeletal: Negative for back pain.    Allergies  Clarithromycin  Home Medications   Current Outpatient Rx  Name Route Sig Dispense Refill  . HYDROCODONE-ACETAMINOPHEN 10-325 MG PO TABS Oral Take 1 tablet by mouth every 6 (six) hours as needed.      Marland Kitchen LORATADINE 10 MG PO TABS Oral Take 10 mg by mouth daily.      Marland Kitchen NORGESTIMATE-ETH ESTRADIOL 0.25-35 MG-MCG PO TABS Oral Take 1 tablet by mouth daily.        BP 109/68  Pulse 81  Temp(Src) 97.8 F (36.6 C) (Oral)  Resp 18  SpO2 100%  LMP 07/25/2011  Physical Exam  Nursing note and vitals reviewed. Constitutional: She appears well-developed and well-nourished. No distress.  HENT:  Head: Normocephalic and atraumatic.  Right Ear: External ear normal.  Left Ear: External ear normal.  Eyes: Conjunctivae are normal. Right eye exhibits no discharge. Left eye exhibits no discharge. No scleral icterus.  Neck: Neck supple. No tracheal deviation present.  Cardiovascular: Normal rate, regular rhythm and intact distal pulses.   Pulmonary/Chest: Effort normal and breath sounds normal. No stridor. No respiratory distress. She has no wheezes.  She has no rales.  Abdominal: Soft. Bowel sounds are normal. She exhibits no distension. There is tenderness in the suprapubic area. There is no rigidity, no rebound and no guarding. No hernia.  Genitourinary: There is no rash on the right labia. There is no rash or tenderness on the left labia. Uterus is tender. Cervix exhibits motion tenderness and discharge. Right adnexum displays no mass and no tenderness. Left adnexum displays no mass and no tenderness. No erythema or tenderness around the vagina. No  foreign body around the vagina. Vaginal discharge found.  Musculoskeletal: She exhibits no edema and no tenderness.  Neurological: She is alert. She has normal strength. No sensory deficit. Cranial nerve deficit:  no gross defecits noted. She exhibits normal muscle tone. She displays no seizure activity. Coordination normal.  Skin: Skin is warm and dry. No rash noted.  Psychiatric: She has a normal mood and affect.    ED Course  Procedures (including critical care time)  Labs Reviewed  URINALYSIS, ROUTINE W REFLEX MICROSCOPIC - Abnormal; Notable for the following:    APPearance TURBID (*)    Leukocytes, UA TRACE (*)    All other components within normal limits  CBC - Abnormal; Notable for the following:    MCH 34.2 (*)    All other components within normal limits  BASIC METABOLIC PANEL - Abnormal; Notable for the following:    Potassium 3.2 (*)    All other components within normal limits  URINE MICROSCOPIC-ADD ON - Abnormal; Notable for the following:    Squamous Epithelial / LPF FEW (*)    Bacteria, UA FEW (*)    All other components within normal limits  DIFFERENTIAL  POCT PREGNANCY, URINE  GC/CHLAMYDIA PROBE AMP, GENITAL  RPR  WET PREP, GENITAL   No results found.   1. Cervicitis       MDM  Pt afebrile.  Doubt PID.  Will treat for cervicitis.  Could have a component of her IC but with her vaginal discharge and cervical tenderness suspect that is the primary etiology.  Encouraged pt to follow up with PCP or GYN doctor        Celene Kras, MD 08/05/11 [phone]

## 2011-08-05 NOTE — Discharge Instructions (Signed)
Cervicitis Cervicitis is a soreness and swelling (inflammation) of the cervix. Your cervix is located at the bottom of your uterus which opens up to the vagina.  CAUSES   Sexually transmitted infections (STIs).   Allergic reaction.   Medicines or birth control devices that are put in the vagina.   Injury to the cervix.   Bacterial infections.  SYMPTOMS  There may be no symptoms. If symptoms occur, they may include:  Grey, white, yellow, or bad smelling vaginal discharge.   Pain or itching of the area outside the vagina.   Painful sexual intercourse.   Lower abdominal or lower back pain, especially during intercourse.   Frequent urination.   Abnormal vaginal bleeding between periods, after sexual intercourse, or after menopause.   Pressure or a heavy feeling in the pelvis.  DIAGNOSIS  Diagnosis is made after a pelvic exam. Other tests may include:  Examination of any discharge under a microscope (wet prep).   A Pap test.  TREATMENT  Treatment will depend on the cause of cervicitis. If it is caused by an STI, both you and your partner will need to be treated. Antibiotic medicines will be given. HOME CARE INSTRUCTIONS   Do not have sexual intercourse until your caregiver says it is okay.   Do not have sexual intercourse until your partner has been treated if your cervicitis is caused by an STI.   Take your antibiotics as directed. Finish them even if you start to feel better.  SEEK IMMEDIATE MEDICAL CARE IF:   Your symptoms come back.   You have a fever.   You experience any problems that may be related to the medicine you are taking.  MAKE SURE YOU:   Understand these instructions.   Will watch your condition.   Will get help right away if you are not doing well or get worse.  Document Released: 02/21/2005 Document Revised: 02/10/2011 Document Reviewed: 09/20/2010 [facility] Patient Information 2012 [place], [place].RESOURCE GUIDE  Chronic Pain  Problems: Contact [facility]  [phone] Patients need to be referred by their primary care doctor.  Insufficient Money for Medicine: Contact United Way:  call "211" or [facility] 703-317-7067.  No Primary Care Doctor: - Call [facility]  [phone] - can help you locate a primary care doctor that  accepts your insurance, provides certain services, etc. - Physician Referral [phone]  Agencies that provide inexpensive medical care: - [facility]  [phone] - [facility]  [phone] - [facility]  [phone] [facility]  [phone] - [facility]  [phone] [facility]  [phone]  Medicaid-accepting [facility] Providers: - [facility]- [address]  [phone], Mon-Fri 9am-7pm, Sat 9am-1pm - [place]- [address] [place], [address] [place]  [phone] - [facility]- [address] [place]  [phone] [facility]- [address]  [phone] - Renaye Rakers- [address] [place], [address], [phone]  Only accepts [place] patients after they have their name  applied to their card  Self Pay (no insurance) in [place]: - Sickle Cell Patients: Dr Willey Blade, [facility]  [address] [place], [phone] - [facility]- [address] [place]  [phone]       [facility] - [address]       -  [facility]- see information above (Speak to [employer] if you do not have insurance)       -  [facility]- [address] [place], [phone]       -  [facility]- [address],  [phone]       -  [facility]- [address], [phone]       -  Dr Julio Sicks-  [address], [place], [phone]       -  [facility]- [address] [phone]       -   [facility]- [address] [phone], also [address] [phone]       -    [facility]- [address] [place], [phone], 1st & 3rd Saturday   every month, 10am-1pm  1) Find a Doctor and Pay Out of Pocket Although you won't have to find out who is covered by your insurance plan, it is a good idea to ask around and get recommendations. You will then need to call the office and see if the doctor you have chosen will accept you as a new patient and what types of options they offer for patients who are self-pay. Some doctors offer discounts or will set up payment plans for their patients who do not have insurance, but you will need to ask so you aren't surprised when you get to your appointment.  2) Contact Your Local Health Department Not all health departments have doctors that can see patients for sick visits, but many do, so it is worth a call to see if yours does. If you don't know where your local health department is, you can check in your phone book. The CDC also has a tool to help you locate your state's health department, and many state websites also have listings of all of their local health departments.  3) Find a Walk-in Clinic If your illness is not likely to be very severe or complicated, you may want to try a walk in clinic. These are popping up all over the country in pharmacies, drugstores, and shopping centers. They're usually staffed by nurse practitioners or physician assistants that have been trained to treat common illnesses and complaints. They're usually fairly quick and inexpensive. However, if you have serious medical issues or chronic medical problems, these are probably not your best option  STD Testing - [facility] Department of [facility], STD Clinic, [address], [place], phone [phone] or [phone].  Monday - Friday, call for an appointment. [facility] Department of [employer],  STD Clinic, [place] [address], [place], phone [phone] or [phone].  Monday - Friday, call for an appointment.  Abuse/Neglect: [facility] Child Abuse Hotline 626-082-6511 [facility] Child Abuse Hotline 907-599-6523 (After Hours)  Emergency Shelter:  [facility] (239)[phone]  Maternity Homes: - [facility] 769-072-2486 - [facility] [phone]  MRSA Hotline #:   431-744-4842  [facility] Resources  [facility]  United Way [facility] Dept. [address]                 [address]         [address] [place] [address]  [address]  Cristobal Goldmann Phone:  [phone]                                  Phone:  [phone]                   Phone:  [phone]  [facility], [phone] - [phone]       -     [facility] in [place], [address],                                  820-845-4311, [facility] Child Abuse Hotline 979-185-4604 or (754) 332-2854 (After Hours)   Behavioral Health Services  Substance Abuse Resources: - [facility]  458-883-4777 - [facility] [phone] - [facility] [phone] Floydene Flock [phone] - Residential & Outpatient Substance Abuse Program  [phone]  Psychological Services: [facility]  [phone] [facility] Services  [phone] - [facility], [phone] [place]. [address] [place], ACCESS LINE: 423-502-7804 or ([phone], [employer]  Dental Assistance  If unable to pay or uninsured, contact:  [facility] or [facility]. to become qualified for the adult dental clinic.  Patients with Medicaid: [facility] [phone] [address], [phone] [address], [phone]  If unable to pay, or uninsured, contact [facility] [phone]) or [facility] Department [phone] in [place], [phone] in [facility]) to become qualified for the adult dental clinic  Other Low-Cost Community Dental Services: - [facility]- [address] [place], [place], [place], [address], [phone], [address]. [phone], 2nd and 4th Thursday of the month at 6:30am.  10 clients each day by appointment, can sometimes see walk-in patients if someone does not show for an appointment. [facility]- [address] [place], [place], [address], [phone] - [facility]- [address], [place], [place], [address], [phone] - [facility]- [phone] [facility] Department- [phone] [facility] Department- [phone]

## 2011-08-05 NOTE — ED Notes (Signed)
Pt reports lower abdominal pain, worse to right side starting last night and becoming progressively worse this AM. Pt reports she has had this pain before, associated with interstitial cystitis. Pt reports taking Norco 10mg  at 3AM with mild relief.  Pt denies painful urination, blood in urine.  Pt reports vaginal discharge- she denies odor.  Pt denies vomiting or diarrhea and reports nausea.

## 2011-08-06 LAB — GC/CHLAMYDIA PROBE AMP, GENITAL
Chlamydia, DNA Probe: NEGATIVE
GC Probe Amp, Genital: NEGATIVE

## 2011-09-14 ENCOUNTER — Encounter (HOSPITAL_COMMUNITY): Payer: Self-pay | Admitting: *Deleted

## 2011-09-14 ENCOUNTER — Emergency Department (HOSPITAL_COMMUNITY)
Admission: EM | Admit: 2011-09-14 | Discharge: 2011-09-14 | Disposition: A | Discharge status: Home / Self Care | Payer: Self-pay | Attending: Emergency Medicine | Admitting: Emergency Medicine

## 2011-09-14 DIAGNOSIS — R102 Pelvic and perineal pain: Secondary | ICD-10-CM

## 2011-09-14 DIAGNOSIS — Z79899 Other long term (current) drug therapy: Secondary | ICD-10-CM | POA: Insufficient documentation

## 2011-09-14 DIAGNOSIS — F17200 Nicotine dependence, unspecified, uncomplicated: Secondary | ICD-10-CM | POA: Insufficient documentation

## 2011-09-14 DIAGNOSIS — N949 Unspecified condition associated with female genital organs and menstrual cycle: Secondary | ICD-10-CM | POA: Insufficient documentation

## 2011-09-14 HISTORY — DX: Proteinuria, unspecified: R80.9

## 2011-09-14 LAB — CBC
MCH: 34.7 pg — ABNORMAL HIGH (ref 26.0–34.0)
MCHC: 35.8 g/dL (ref 30.0–36.0)
MCV: 97 fL (ref 78.0–100.0)
Platelets: 209 10*3/uL (ref 150–400)
RBC: 4.06 MIL/uL (ref 3.87–5.11)
RDW: 12.3 % (ref 11.5–15.5)

## 2011-09-14 LAB — DIFFERENTIAL
Basophils Relative: 1 % (ref 0–1)
Eosinophils Absolute: 0.1 10*3/uL (ref 0.0–0.7)
Eosinophils Relative: 2 % (ref 0–5)
Lymphs Abs: 2 10*3/uL (ref 0.7–4.0)

## 2011-09-14 LAB — URINALYSIS, ROUTINE W REFLEX MICROSCOPIC
Glucose, UA: NEGATIVE mg/dL
Leukocytes, UA: NEGATIVE
Nitrite: NEGATIVE
Specific Gravity, Urine: 1.02 (ref 1.005–1.030)
pH: 6.5 (ref 5.0–8.0)

## 2011-09-14 LAB — URINE MICROSCOPIC-ADD ON

## 2011-09-14 LAB — BASIC METABOLIC PANEL
Calcium: 9.7 mg/dL (ref 8.4–10.5)
GFR calc non Af Amer: >90 mL/min (ref >90)
Glucose, Bld: 75 mg/dL (ref 70–99)
Sodium: 138 mEq/L (ref 135–145)

## 2011-09-14 MED ORDER — ETODOLAC 500 MG PO TABS: 500.0000 mg | ORAL_TABLET | Freq: Two times a day (BID) | ORAL | Status: DC

## 2011-09-14 MED ORDER — ONDANSETRON HCL 4 MG/2ML IJ SOLN
4.0000 mg | Freq: Once | INTRAMUSCULAR | Status: AC
  Administered 2011-09-14: 4 mg via INTRAVENOUS
  Filled 2011-09-14: qty 2

## 2011-09-14 MED ORDER — OXYCODONE-ACETAMINOPHEN 5-325 MG PO TABS
1.0000 | ORAL_TABLET | Freq: Once | ORAL | Status: AC
  Administered 2011-09-14: 1 via ORAL
  Filled 2011-09-14: qty 1

## 2011-09-14 MED ORDER — SODIUM CHLORIDE 0.9 % IV SOLN
1000.0000 mL | INTRAVENOUS | Status: DC
  Administered 2011-09-14: 1000 mL via INTRAVENOUS

## 2011-09-14 MED ORDER — TRAMADOL HCL 50 MG PO TABS: 50.0000 mg | ORAL_TABLET | Freq: Four times a day (QID) | ORAL | Status: AC | PRN

## 2011-09-14 MED ORDER — SODIUM CHLORIDE 0.9 % IV SOLN
1000.0000 mL | Freq: Once | INTRAVENOUS | Status: AC
  Administered 2011-09-14: 1000 mL via INTRAVENOUS

## 2011-09-14 NOTE — ED Provider Notes (Signed)
History     CSN: [id number] Arrival date & time 09/14/11  1450 First MD Initiated Contact with Patient 09/14/11 1746    Chief Complaint  Patient presents with  . Abdominal Pain   HPI Pt has history of recurrent abdominal pain problems.  She has history of interstitial cystitis.  Pt states she started having pain again this week.  She does not have a PCP.  SHe has some vaginal discharge.   Pt is complaining of breast tenderness. No fevers, some nausea.    No diarrhea.  LMP was on the 16th.    She was seen back on May 31 and treated for cervicitis.  The symptoms improved after that.  Past Medical History  Diagnosis Date  . Interstitial cystitis   . Protein in urine     Past Surgical History  Procedure Date  . Dilation and curettage of uterus     Family History  Problem Relation Age of Onset  . Cancer Maternal Uncle     History  Substance Use Topics  . Smoking status: Current Some Day Smoker -- 1.0 packs/day    Types: Cigarettes  . Smokeless tobacco: Never Used  . Alcohol Use: Yes     occ    OB History    Grav Para Term Preterm Abortions TAB SAB Ect Mult Living                  Review of Systems  All other systems reviewed and are negative.    Allergies  Clarithromycin  Home Medications   Current Outpatient Rx  Name Route Sig Dispense Refill  . COQ-10 PO Oral Take 1 capsule by mouth daily.    Marland Kitchen HYDROCODONE-ACETAMINOPHEN 10-325 MG PO TABS Oral Take 1 tablet by mouth every 6 (six) hours as needed. For pain. 16 tablet 0  . IBUPROFEN 800 MG PO TABS Oral Take 800 mg by mouth every 8 (eight) hours as needed. For pain.    Marland Kitchen LORATADINE 10 MG PO TABS Oral Take 10 mg by mouth 2 (two) times daily as needed. For allergies.    . MELOXICAM 15 MG PO TABS Oral Take 15 mg by mouth daily as needed. For pain.    Marland Kitchen NORGESTIMATE-ETH ESTRADIOL 0.25-35 MG-MCG PO TABS Oral Take 1 tablet by mouth daily.        BP 122/65  Pulse 93  Temp 98.7 F (37.1 C) (Oral)  Resp 15  SpO2  100%  LMP 08/22/2011  Physical Exam  Nursing note and vitals reviewed. Constitutional: She appears well-developed and well-nourished. No distress.  HENT:  Head: Normocephalic and atraumatic.  Right Ear: External ear normal.  Left Ear: External ear normal.  Eyes: Conjunctivae are normal. Right eye exhibits no discharge. Left eye exhibits no discharge. No scleral icterus.  Neck: Neck supple. No tracheal deviation present.  Cardiovascular: Normal rate, regular rhythm and intact distal pulses.   Pulmonary/Chest: Effort normal and breath sounds normal. No stridor. No respiratory distress. She has no wheezes. She has no rales.  Abdominal: Soft. Bowel sounds are normal. She exhibits no distension. There is tenderness (mild llq). There is no rebound and no guarding.  Genitourinary: There is no rash or tenderness on the right labia. There is no rash or tenderness on the left labia. Uterus is tender. Cervix exhibits no motion tenderness and no friability. Right adnexum displays no mass. Left adnexum displays no mass. No erythema or bleeding around the vagina. No signs of injury around the vagina. Vaginal discharge  found.  Musculoskeletal: She exhibits no edema and no tenderness.  Neurological: She is alert. She has normal strength. No sensory deficit. Cranial nerve deficit:  no gross defecits noted. She exhibits normal muscle tone. She displays no seizure activity. Coordination normal.  Skin: Skin is warm and dry. No rash noted.  Psychiatric: She has a normal mood and affect.    ED Course  Procedures (including critical care time)  Labs Reviewed  URINALYSIS, ROUTINE W REFLEX MICROSCOPIC - Abnormal; Notable for the following:    APPearance CLOUDY (*)     Protein, ur 30 (*)     All other components within normal limits  URINE MICROSCOPIC-ADD ON - Abnormal; Notable for the following:    Squamous Epithelial / LPF MANY (*)     Bacteria, UA FEW (*)     All other components within normal limits  CBC  - Abnormal; Notable for the following:    MCH 34.7 (*)     All other components within normal limits  BASIC METABOLIC PANEL - Abnormal; Notable for the following:    Potassium 3.4 (*)     All other components within normal limits  DIFFERENTIAL  PREGNANCY, URINE  GC/CHLAMYDIA PROBE AMP, GENITAL  RPR  WET PREP, GENITAL   No results found.   No diagnosis found.    MDM  I reviewed the patient's previous records. The GC and Chlamydia from her the previous visit in May were negative.  Patient has history of recurrent episodes of lower abdominal pain. It is possible that her symptoms are related to endometriosis versus symptoms associated with her interstitial cystitis.  I do not feel that antibiotics are indicated at this time. Once again I have recommended she followup with a gynecologist or her primary care Dr.      Celene Kras, MD 09/14/11 2026

## 2011-09-14 NOTE — ED Notes (Signed)
C/o abd pain and abd swelling for about a week with symptoms getting worse. Nausea and vomitng, no diarrhea

## 2011-09-14 NOTE — ED Notes (Addendum)
Pt c/o of lower abd pain that started over a week ago and has progressively gotten worse. Pain is 9/10 currently and is more on the left side. Hx of interstitial cystitis and ovarian cyst. LMP 08/21/11.

## 2011-09-14 NOTE — ED Notes (Signed)
Pt requesting pain medicine.

## 2011-09-15 LAB — GC/CHLAMYDIA PROBE AMP, GENITAL: GC Probe Amp, Genital: NEGATIVE

## 2011-11-21 ENCOUNTER — Emergency Department (HOSPITAL_COMMUNITY): Payer: Self-pay

## 2011-11-21 ENCOUNTER — Encounter (HOSPITAL_COMMUNITY): Payer: Self-pay | Admitting: Emergency Medicine

## 2011-11-21 ENCOUNTER — Emergency Department (HOSPITAL_COMMUNITY)
Admission: EM | Admit: 2011-11-21 | Discharge: 2011-11-21 | Disposition: A | Discharge status: Home / Self Care | Payer: Self-pay | Attending: Emergency Medicine | Admitting: Emergency Medicine

## 2011-11-21 DIAGNOSIS — O99891 Other specified diseases and conditions complicating pregnancy: Secondary | ICD-10-CM | POA: Insufficient documentation

## 2011-11-21 DIAGNOSIS — O210 Mild hyperemesis gravidarum: Secondary | ICD-10-CM | POA: Insufficient documentation

## 2011-11-21 DIAGNOSIS — Z3490 Encounter for supervision of normal pregnancy, unspecified, unspecified trimester: Secondary | ICD-10-CM

## 2011-11-21 DIAGNOSIS — O219 Vomiting of pregnancy, unspecified: Secondary | ICD-10-CM

## 2011-11-21 DIAGNOSIS — R109 Unspecified abdominal pain: Secondary | ICD-10-CM | POA: Insufficient documentation

## 2011-11-21 LAB — URINALYSIS, ROUTINE W REFLEX MICROSCOPIC
Glucose, UA: NEGATIVE mg/dL
Leukocytes, UA: NEGATIVE
pH: 6.5 (ref 5.0–8.0)

## 2011-11-21 LAB — WET PREP, GENITAL: Yeast Wet Prep HPF POC: NONE SEEN

## 2011-11-21 LAB — CBC WITH DIFFERENTIAL/PLATELET
HCT: 33.6 % — ABNORMAL LOW (ref 36.0–46.0)
Hemoglobin: 12.1 g/dL (ref 12.0–15.0)
Lymphocytes Relative: 28 % (ref 12–46)
Monocytes Absolute: 0.7 10*3/uL (ref 0.1–1.0)
Monocytes Relative: 7 % (ref 3–12)
Neutro Abs: 5.8 10*3/uL (ref 1.7–7.7)
WBC: 9.3 10*3/uL (ref 4.0–10.5)

## 2011-11-21 LAB — CBC
MCH: 33.5 pg (ref 26.0–34.0)
MCV: 94.8 fL (ref 78.0–100.0)
Platelets: 240 10*3/uL (ref 150–400)
RDW: 11.9 % (ref 11.5–15.5)
WBC: 7.7 10*3/uL (ref 4.0–10.5)

## 2011-11-21 LAB — COMPREHENSIVE METABOLIC PANEL
AST: 8 U/L (ref 0–37)
BUN: 4 mg/dL — ABNORMAL LOW (ref 6–23)
CO2: 25 mEq/L (ref 19–32)
Chloride: 98 mEq/L (ref 96–112)
Creatinine, Ser: 0.53 mg/dL (ref 0.50–1.10)
GFR calc non Af Amer: >90 mL/min (ref >90)
Total Bilirubin: 0.2 mg/dL — ABNORMAL LOW (ref 0.3–1.2)

## 2011-11-21 LAB — ABO/RH: ABO/RH(D): O POS

## 2011-11-21 MED ORDER — ONDANSETRON HCL 4 MG PO TABS: 4.0000 mg | ORAL_TABLET | Freq: Four times a day (QID) | ORAL | Status: AC

## 2011-11-21 MED ORDER — ONDANSETRON 8 MG PO TBDP
8.0000 mg | ORAL_TABLET | Freq: Once | ORAL | Status: AC
  Administered 2011-11-21: 8 mg via ORAL
  Filled 2011-11-21 (×2): qty 1

## 2011-11-21 NOTE — ED Notes (Addendum)
Pt reports hx of interstitial cystitis and ovarian cysts. Pt c/o medium sized emesis color related to food. Pt states she has not taken a pregnancy test nor has is she expected.

## 2011-11-21 NOTE — ED Notes (Signed)
Pt alert, arrives from home, c/o lower abd pain, chronic in nature, resp even unlabored, skin pwd

## 2011-11-21 NOTE — ED Provider Notes (Signed)
History     CSN: [id number]  Arrival date & time 11/21/11  0057   First MD Initiated Contact with Patient 11/21/11 [phone]      Chief Complaint  Patient presents with  . Abdominal Pain    (Consider location/radiation/quality/duration/timing/severity/associated sxs/prior treatment) HPI 21 year old female presents to restart complaining of lower abdominal pain. She reports she has a history of interstitial cystitis and now is having some lower abdominal pain, but has been worse over the last week. She denies any dysuria, no vaginal discharge. Pain is slightly worse on the left lower abdomen versus the right. Her last menstrual period was July 20, reports she has history of irregular periods. She's had no vaginal bleeding. Patient is also complaining of severe nausea and vomiting over the last 2 weeks worsening over the last 3 days. Patient noted to have positive pregnancy test, has not taken a home pregnancy test not because she was pregnant. Patient is a G2 P0010, reports one miscarriage.  Past Medical History  Diagnosis Date  . Interstitial cystitis   . Protein in urine     Past Surgical History  Procedure Date  . Dilation and curettage of uterus     Family History  Problem Relation Age of Onset  . Cancer Maternal Uncle     History  Substance Use Topics  . Smoking status: Current Some Day Smoker -- 1.0 packs/day    Types: Cigarettes  . Smokeless tobacco: Never Used  . Alcohol Use: Yes     occ    OB History    Grav Para Term Preterm Abortions TAB SAB Ect Mult Living                  Review of Systems  All other systems reviewed and are negative.    Allergies  Clarithromycin  Home Medications   Current Outpatient Rx  Name Route Sig Dispense Refill  . HYDROCODONE-ACETAMINOPHEN 10-325 MG PO TABS Oral Take 1 tablet by mouth every 6 (six) hours as needed. For pain. 16 tablet 0  . LORATADINE 10 MG PO TABS Oral Take 10 mg by mouth 2 (two) times daily as needed. For  allergies.    . MELOXICAM 15 MG PO TABS Oral Take 15 mg by mouth daily.      BP 126/87  Pulse 62  Temp 99.3 F (37.4 C) (Oral)  Resp 18  SpO2 100%  LMP 09/24/2011  Physical Exam  Nursing note and vitals reviewed. Constitutional: She is oriented to person, place, and time. She appears well-developed and well-nourished.  HENT:  Head: Normocephalic and atraumatic.  Nose: Nose normal.  Mouth/Throat: Oropharynx is clear and moist.  Eyes: Conjunctivae normal and EOM are normal. Pupils are equal, round, and reactive to light.  Neck: Normal range of motion. Neck supple. No JVD present. No tracheal deviation present. No thyromegaly present.  Cardiovascular: Normal rate, regular rhythm, normal heart sounds and intact distal pulses.  Exam reveals no gallop and no friction rub.   No murmur heard. Pulmonary/Chest: Effort normal and breath sounds normal. No stridor. No respiratory distress. She has no wheezes. She has no rales. She exhibits no tenderness.  Abdominal: Soft. Bowel sounds are normal. She exhibits no distension and no mass. There is no tenderness. There is no rebound and no guarding.  Genitourinary: Vaginal discharge (thick white d/c) found.       Normal external genitalia, right adnexa nontender, tenderness or bladder. Uterus nontender, slight fullness and tenderness to left adnexa  Musculoskeletal: Normal  range of motion. She exhibits no edema and no tenderness.  Lymphadenopathy:    She has no cervical adenopathy.  Neurological: She is alert and oriented to person, place, and time. She exhibits normal muscle tone. Coordination normal.  Skin: Skin is warm and dry. No rash noted. No erythema. No pallor.  Psychiatric: She has a normal mood and affect. Her behavior is normal. Judgment and thought content normal.    ED Course  Procedures (including critical care time)  Labs Reviewed  PREGNANCY, URINE - Abnormal; Notable for the following:    Preg Test, Ur POSITIVE (*)     All  other components within normal limits  CBC WITH DIFFERENTIAL - Abnormal; Notable for the following:    RBC 3.56 (*)     HCT 33.6 (*)     All other components within normal limits  COMPREHENSIVE METABOLIC PANEL - Abnormal; Notable for the following:    Sodium 134 (*)     Potassium 3.4 (*)     BUN 4 (*)     Total Bilirubin 0.2 (*)     All other components within normal limits  CBC - Abnormal; Notable for the following:    RBC 3.43 (*)     Hemoglobin 11.5 (*)     HCT 32.5 (*)     All other components within normal limits  HCG, QUANTITATIVE, PREGNANCY - Abnormal; Notable for the following:    hCG, Beta Chain, Quant, S 16109 (*)     All other components within normal limits  WET PREP, GENITAL - Abnormal; Notable for the following:    WBC, Wet Prep HPF POC RARE (*)     All other components within normal limits  URINALYSIS, ROUTINE W REFLEX MICROSCOPIC  ABO/RH  GC/CHLAMYDIA PROBE AMP, GENITAL   US Ob Comp Less 14 Wks  11/21/2011  *RADIOLOGY REPORT*  Clinical Data: Pregnant patient with lower abdominal pain.  OBSTETRIC <14 WK [place] AND TRANSVAGINAL OB US  Technique:  Both transabdominal and transvaginal ultrasound examinations were performed for complete evaluation of the gestation as well as the maternal uterus, adnexal regions, and pelvic cul-de-sac.  Transvaginal technique was performed to assess early pregnancy.  Comparison:  None.  Intrauterine gestational sac:  Visualized/normal in shape. Yolk sac: Visualized. Embryo: Visualized. Cardiac Activity: Detected. Heart Rate: 173 bpm  CRL: 17.8  mm  8 w  2 d             [place] EDC: 06/30/2012  Maternal uterus/adnexae: Unremarkable.  IMPRESSION: Single living intrauterine pregnancy without acute abnormality.   Original Report Authenticated By: Bernadene Bell. D'ALESSIO, M.D.    US Ob Transvaginal  11/21/2011  *RADIOLOGY REPORT*  Clinical Data: Pregnant patient with lower abdominal pain.  OBSTETRIC <14 WK [place] AND TRANSVAGINAL OB US  Technique:  Both  transabdominal and transvaginal ultrasound examinations were performed for complete evaluation of the gestation as well as the maternal uterus, adnexal regions, and pelvic cul-de-sac.  Transvaginal technique was performed to assess early pregnancy.  Comparison:  None.  Intrauterine gestational sac:  Visualized/normal in shape. Yolk sac: Visualized. Embryo: Visualized. Cardiac Activity: Detected. Heart Rate: 173 bpm  CRL: 17.8  mm  8 w  2 d             [place] EDC: 06/30/2012  Maternal uterus/adnexae: Unremarkable.  IMPRESSION: Single living intrauterine pregnancy without acute abnormality.   Original Report Authenticated By: Bernadene Bell. D'ALESSIO, M.D.      1. Intrauterine normal pregnancy   2. Abdominal pain  in pregnancy   3. Nausea and vomiting in pregnancy       MDM  21 year old female with lower, pain in pregnancy, by LMP she is 8 weeks and 2 days, this is correlated both her ultrasound. No signs of ectopic pregnancy. Patient is given prescription for Zofran and encouraged to followup with obstetrics        Olivia Mackie, MD 11/21/11 [phone]

## 2011-11-21 NOTE — ED Notes (Signed)
Pt family member continuing to curse, commenting something to staff involving, "talking behind his back and shit" when notified that doctor would need computer and stool for exam

## 2011-11-21 NOTE — ED Notes (Signed)
Returned to room to find pt family in drawers under computer

## 2011-11-21 NOTE — ED Notes (Signed)
Pt family member is noted to have been walking into empty rooms taking pillows from other nurses' rooms.

## 2011-11-21 NOTE — ED Notes (Signed)
Pt family member seemingly displeased with hall bed as he referred to another pt as a "crazy ass idiot" being moved into a room.

## 2011-11-22 LAB — GC/CHLAMYDIA PROBE AMP, GENITAL
Chlamydia, DNA Probe: NEGATIVE
GC Probe Amp, Genital: NEGATIVE

## 2011-12-14 ENCOUNTER — Encounter (HOSPITAL_COMMUNITY): Payer: Self-pay | Admitting: *Deleted

## 2011-12-14 ENCOUNTER — Inpatient Hospital Stay (HOSPITAL_COMMUNITY)
Admission: AD | Admit: 2011-12-14 | Discharge: 2011-12-14 | Disposition: A | Discharge status: Home / Self Care | Payer: Self-pay | Source: Ambulatory Visit | Attending: Obstetrics & Gynecology | Admitting: Obstetrics & Gynecology

## 2011-12-14 DIAGNOSIS — D249 Benign neoplasm of unspecified breast: Secondary | ICD-10-CM | POA: Insufficient documentation

## 2011-12-14 DIAGNOSIS — O219 Vomiting of pregnancy, unspecified: Secondary | ICD-10-CM

## 2011-12-14 DIAGNOSIS — R109 Unspecified abdominal pain: Secondary | ICD-10-CM | POA: Insufficient documentation

## 2011-12-14 DIAGNOSIS — O210 Mild hyperemesis gravidarum: Secondary | ICD-10-CM | POA: Insufficient documentation

## 2011-12-14 DIAGNOSIS — O26899 Other specified pregnancy related conditions, unspecified trimester: Secondary | ICD-10-CM

## 2011-12-14 DIAGNOSIS — O99891 Other specified diseases and conditions complicating pregnancy: Secondary | ICD-10-CM | POA: Insufficient documentation

## 2011-12-14 DIAGNOSIS — N760 Acute vaginitis: Secondary | ICD-10-CM | POA: Insufficient documentation

## 2011-12-14 DIAGNOSIS — O2390 Unspecified genitourinary tract infection in pregnancy, unspecified trimester: Secondary | ICD-10-CM | POA: Insufficient documentation

## 2011-12-14 DIAGNOSIS — A499 Bacterial infection, unspecified: Secondary | ICD-10-CM | POA: Insufficient documentation

## 2011-12-14 DIAGNOSIS — B9689 Other specified bacterial agents as the cause of diseases classified elsewhere: Secondary | ICD-10-CM | POA: Insufficient documentation

## 2011-12-14 LAB — CBC WITH DIFFERENTIAL/PLATELET
Basophils Relative: 0 % (ref 0–1)
Eosinophils Absolute: 0.2 10*3/uL (ref 0.0–0.7)
Eosinophils Relative: 3 % (ref 0–5)
HCT: 30.4 % — ABNORMAL LOW (ref 36.0–46.0)
Hemoglobin: 10.8 g/dL — ABNORMAL LOW (ref 12.0–15.0)
MCH: 33.6 pg (ref 26.0–34.0)
MCHC: 35.5 g/dL (ref 30.0–36.0)
MCV: 94.7 fL (ref 78.0–100.0)
Monocytes Absolute: 0.5 10*3/uL (ref 0.1–1.0)
Monocytes Relative: 5 % (ref 3–12)

## 2011-12-14 LAB — GC/CHLAMYDIA PROBE AMP, GENITAL
Chlamydia, DNA Probe: NEGATIVE
GC Probe Amp, Genital: NEGATIVE

## 2011-12-14 LAB — URINALYSIS, ROUTINE W REFLEX MICROSCOPIC
Nitrite: NEGATIVE
Specific Gravity, Urine: 1.02 (ref 1.005–1.030)
Urobilinogen, UA: 0.2 mg/dL (ref 0.0–1.0)

## 2011-12-14 LAB — OB RESULTS CONSOLE HEPATITIS B SURFACE ANTIGEN: Hepatitis B Surface Ag: NEGATIVE

## 2011-12-14 LAB — OB RESULTS CONSOLE RPR: RPR: NONREACTIVE

## 2011-12-14 LAB — OB RESULTS CONSOLE RUBELLA ANTIBODY, IGM: Rubella: IMMUNE

## 2011-12-14 MED ORDER — PROMETHAZINE HCL 25 MG PO TABS: 12.5000 mg | ORAL_TABLET | Freq: Four times a day (QID) | ORAL | Status: DC | PRN

## 2011-12-14 MED ORDER — METRONIDAZOLE 500 MG PO TABS: 500.0000 mg | ORAL_TABLET | Freq: Two times a day (BID) | ORAL | Status: DC

## 2011-12-14 MED ORDER — OXYCODONE-ACETAMINOPHEN 5-325 MG PO TABS
1.0000 | ORAL_TABLET | Freq: Once | ORAL | Status: AC
  Administered 2011-12-14: 1 via ORAL
  Filled 2011-12-14: qty 1

## 2011-12-14 NOTE — MAU Note (Signed)
Pt reports "i have really bad abd pain", states she was seen at [facility] one month ago and had 2 cysts on left ovary and pain has never gone away and is now worsening.

## 2011-12-14 NOTE — MAU Provider Note (Signed)
History     CSN: [id number]  Arrival date and time: 12/14/11 4098   First Provider Initiated Contact with Patient 12/14/11 0310      Chief Complaint  Patient presents with  . Abdominal Pain   HPI Amber [name] is a 21 y.o. female @ [redacted]w[redacted]d gestation who presents to MAU with abdominal pain. The pain started a month ago. The pain increased tonight. Came home from work at 11 pm and felt nauseated, vomited x 1.  The patient was here when she was [redacted] weeks gestation and told she had a cyst on her ovary.  Also complains of tenderness left breast. Hx of left breast cyst by ultrasound. The history was provided by the patient and her medical record.  OB History    Grav Para Term Preterm Abortions TAB SAB Ect Mult Living   1               Past Medical History  Diagnosis Date  . Interstitial cystitis   . Protein in urine     Past Surgical History  Procedure Date  . Dilation and curettage of uterus     Family History  Problem Relation Age of Onset  . Cancer Maternal Uncle     History  Substance Use Topics  . Smoking status: Current Some Day Smoker -- 1.0 packs/day    Types: Cigarettes  . Smokeless tobacco: Never Used  . Alcohol Use: Yes     occ    Allergies:  Allergies  Allergen Reactions  . Clarithromycin Anaphylaxis    Prescriptions prior to admission  Medication Sig Dispense Refill  . loratadine (CLARITIN) 10 MG tablet Take 10 mg by mouth 2 (two) times daily as needed. For allergies.      Marland Kitchen HYDROcodone-acetaminophen (NORCO) 10-325 MG per tablet Take 1 tablet by mouth every 6 (six) hours as needed. For pain.  16 tablet  0    Review of Systems  Constitutional: Negative for fever, chills and weight loss.  HENT: Negative for ear pain, nosebleeds, congestion, sore throat and neck pain.   Eyes: Negative for blurred vision, double vision, photophobia and pain.  Respiratory: Negative for cough, shortness of breath and wheezing.   Cardiovascular: Negative for chest pain,  palpitations and leg swelling.  Gastrointestinal: Positive for nausea, vomiting and abdominal pain. Negative for heartburn, diarrhea and constipation.  Genitourinary: Positive for frequency. Negative for dysuria and urgency (hx interstitial cystitis).  Musculoskeletal: Negative for myalgias and back pain.  Skin: Negative for itching and rash.  Neurological: Negative for dizziness, sensory change, speech change, seizures, weakness and headaches.  Endo/Heme/Allergies: Does not bruise/bleed easily.  Psychiatric/Behavioral: Negative for depression. The patient is not nervous/anxious and does not have insomnia.    Physical Exam   Blood pressure 137/69, pulse 94, temperature 98 F (36.7 C), temperature source Oral, resp. rate 18, height 5\' 5"  (1.651 m), weight 127 lb (57.607 kg), last menstrual period 09/24/2011, SpO2 99.00%.  Physical Exam  Nursing note and vitals reviewed. Constitutional: She is oriented to person, place, and time. She appears well-developed and well-nourished. No distress.  HENT:  Head: Normocephalic and atraumatic.  Eyes: EOM are normal.  Neck: Neck supple.  Cardiovascular: Normal rate.   Respiratory: Effort normal.         Approximately 1 cm node palpated @ 9 o'clock left breast. Tender on palpation.  GI: Soft. There is tenderness in the left lower quadrant. There is no rebound, no guarding and no CVA tenderness.  Positive FHT's.  Genitourinary:       External genitalia without lesions. Malodorous frothy  discharge vaginal vault. Cervix long, closed, no CMT, minimal left adnexal tenderness. Uterus consistent with dates.  Musculoskeletal: Normal range of motion.  Neurological: She is alert and oriented to person, place, and time.  Skin: Skin is warm and dry.  Psychiatric: She has a normal mood and affect. Her behavior is normal. Judgment and thought content normal.   Results for orders placed during the hospital encounter of 12/14/11 (from the past 24 hour(s))    URINALYSIS, ROUTINE W REFLEX MICROSCOPIC     Status: Normal   Collection Time   12/14/11  2:50 AM      Component Value Range   Color, Urine YELLOW  YELLOW   APPearance CLEAR  CLEAR   Specific Gravity, Urine 1.020  1.005 - 1.030   pH 6.5  5.0 - 8.0   Glucose, UA NEGATIVE  NEGATIVE mg/dL   Hgb urine dipstick NEGATIVE  NEGATIVE   Bilirubin Urine NEGATIVE  NEGATIVE   Ketones, ur NEGATIVE  NEGATIVE mg/dL   Protein, ur NEGATIVE  NEGATIVE mg/dL   Urobilinogen, UA 0.2  0.0 - 1.0 mg/dL   Nitrite NEGATIVE  NEGATIVE   Leukocytes, UA NEGATIVE  NEGATIVE  WET PREP, GENITAL     Status: Abnormal   Collection Time   12/14/11  3:20 AM      Component Value Range   Yeast Wet Prep HPF POC NONE SEEN  NONE SEEN   Trich, Wet Prep NONE SEEN  NONE SEEN   Clue Cells Wet Prep HPF POC MANY (*) NONE SEEN   WBC, Wet Prep HPF POC FEW (*) NONE SEEN  CBC WITH DIFFERENTIAL     Status: Abnormal   Collection Time   12/14/11  3:25 AM      Component Value Range   WBC 9.5  4.0 - 10.5 K/uL   RBC 3.21 (*) 3.87 - 5.11 MIL/uL   Hemoglobin 10.8 (*) 12.0 - 15.0 g/dL   HCT 16.1 (*) 09.6 - 04.5 %   MCV 94.7  78.0 - 100.0 fL   MCH 33.6  26.0 - 34.0 pg   MCHC 35.5  30.0 - 36.0 g/dL   RDW 40.9  81.1 - 91.4 %   Platelets 235  150 - 400 K/uL   Neutrophils Relative 64  43 - 77 %   Neutro Abs 6.1  1.7 - 7.7 K/uL   Lymphocytes Relative 28  12 - 46 %   Lymphs Abs 2.7  0.7 - 4.0 K/uL   Monocytes Relative 5  3 - 12 %   Monocytes Absolute 0.5  0.1 - 1.0 K/uL   Eosinophils Relative 3  0 - 5 %   Eosinophils Absolute 0.2  0.0 - 0.7 K/uL   Basophils Relative 0  0 - 1 %   Basophils Absolute 0.0  0.0 - 0.1 K/uL   Procedures Assessment: 21 y.o. female @ [redacted]w[redacted]d gestation with abdominal discomfort   Nausea in pregnancy   Fibroadenoma left breast   Bacterial vaginosis  Plan:  Tylenol for discomfort   Rx phenergan   Rx flagyl   Start prenatal care and follow up breast mass, return here as needed    Note: On discharge patient  states she is out of her pain medication, Norco 10 mg because her doctor has been out and she has not been able to get it refilled. Discussed with patient will give pain medication now but she  will need to follow up with her PCP for chronic pain management of interstitial cystitis.   NEESE,HOPE, RN, FNP, [facility] 12/14/2011, 4:00 AM

## 2012-01-07 ENCOUNTER — Encounter (HOSPITAL_COMMUNITY): Payer: Self-pay | Admitting: *Deleted

## 2012-01-07 ENCOUNTER — Inpatient Hospital Stay (HOSPITAL_COMMUNITY)
Admission: AD | Admit: 2012-01-07 | Discharge: 2012-01-07 | Disposition: A | Discharge status: Home / Self Care | Payer: Self-pay | Source: Ambulatory Visit | Attending: Obstetrics & Gynecology | Admitting: Obstetrics & Gynecology

## 2012-01-07 DIAGNOSIS — R109 Unspecified abdominal pain: Secondary | ICD-10-CM | POA: Insufficient documentation

## 2012-01-07 DIAGNOSIS — N760 Acute vaginitis: Secondary | ICD-10-CM | POA: Insufficient documentation

## 2012-01-07 DIAGNOSIS — A499 Bacterial infection, unspecified: Secondary | ICD-10-CM | POA: Insufficient documentation

## 2012-01-07 DIAGNOSIS — O26859 Spotting complicating pregnancy, unspecified trimester: Secondary | ICD-10-CM | POA: Insufficient documentation

## 2012-01-07 DIAGNOSIS — B9689 Other specified bacterial agents as the cause of diseases classified elsewhere: Secondary | ICD-10-CM | POA: Insufficient documentation

## 2012-01-07 DIAGNOSIS — O2390 Unspecified genitourinary tract infection in pregnancy, unspecified trimester: Secondary | ICD-10-CM | POA: Insufficient documentation

## 2012-01-07 HISTORY — DX: Reserved for concepts with insufficient information to code with codable children: IMO0002

## 2012-01-07 HISTORY — DX: Unspecified abnormal cytological findings in specimens from cervix uteri: R87.619

## 2012-01-07 LAB — URINALYSIS, ROUTINE W REFLEX MICROSCOPIC
Bilirubin Urine: NEGATIVE
Hgb urine dipstick: NEGATIVE
Protein, ur: NEGATIVE mg/dL
Specific Gravity, Urine: 1.025 (ref 1.005–1.030)
Urobilinogen, UA: 0.2 mg/dL (ref 0.0–1.0)

## 2012-01-07 LAB — WET PREP, GENITAL: Trich, Wet Prep: NONE SEEN

## 2012-01-07 MED ORDER — METRONIDAZOLE 500 MG PO TABS: 500.0000 mg | ORAL_TABLET | Freq: Two times a day (BID) | ORAL | Status: DC

## 2012-01-07 NOTE — MAU Note (Signed)
Pt reports spotting earlier today, but none currently. Pt reports abdominal pain for 2 days.

## 2012-01-07 NOTE — MAU Provider Note (Signed)
History     CSN: [id number]  Arrival date and time: 01/07/12 4166   First Provider Initiated Contact with Patient 01/07/12 0054      Chief Complaint  Patient presents with  . Abdominal Pain  . Vaginal Bleeding   HPI  Amber [name] is a 21 y.o. G2P0010 at [redacted]w[redacted]d  who presents today with spotting. She states that she had some Balaban spotting with wiping yesterday and then again today. She has had some lower abdominal pain, but has interstitial cystitis and states that it has been worse during pregnancy.   Past Medical History  Diagnosis Date  . Interstitial cystitis   . Protein in urine   . Abnormal Pap smear     Past Surgical History  Procedure Date  . Dilation and curettage of uterus     Family History  Problem Relation Age of Onset  . Cancer Maternal Uncle   . Other Neg Hx     History  Substance Use Topics  . Smoking status: Current Some Day Smoker -- 1.0 packs/day    Types: Cigarettes  . Smokeless tobacco: Never Used  . Alcohol Use: Yes     occ    Allergies:  Allergies  Allergen Reactions  . Clarithromycin Anaphylaxis    Prescriptions prior to admission  Medication Sig Dispense Refill  . HYDROcodone-acetaminophen (NORCO) 10-325 MG per tablet Take 1 tablet by mouth every 6 (six) hours as needed. For pain.  16 tablet  0  . loratadine (CLARITIN) 10 MG tablet Take 10 mg by mouth 2 (two) times daily as needed. For allergies.      . metroNIDAZOLE (FLAGYL) 500 MG tablet Take 1 tablet (500 mg total) by mouth 2 (two) times daily.  14 tablet  0  . promethazine (PHENERGAN) 25 MG tablet Take 0.5 tablets (12.5 mg total) by mouth every 6 (six) hours as needed for nausea.  20 tablet  0    Review of Systems  Constitutional: Negative for fever and chills.  Gastrointestinal: Positive for abdominal pain. Negative for nausea, vomiting, diarrhea and constipation.  Genitourinary: Negative for dysuria, urgency and frequency.  Musculoskeletal: Negative for myalgias.    Neurological: Negative for dizziness.   Physical Exam   Blood pressure 126/68, pulse 76, temperature 98.3 F (36.8 C), temperature source Oral, resp. rate 20, height 5\' 5"  (1.651 m), weight 59.512 kg (131 lb 3.2 oz), last menstrual period 09/24/2011.  Physical Exam  Nursing note and vitals reviewed. Constitutional: She is oriented to person, place, and time. She appears well-developed and well-nourished.  Cardiovascular: Normal rate.   Respiratory: Effort normal.  GI: Soft. Bowel sounds are normal. There is no tenderness.  Genitourinary:        External: normal Vagina: small amount of thin white vaginal discharge with odor Cervix: pink, smooth Closed/thick/high Uterus: AGA  Musculoskeletal: Normal range of motion.  Neurological: She is alert and oriented to person, place, and time.  Skin: Skin is warm and dry.  Psychiatric: She has a normal mood and affect.    MAU Course  Procedures  Results for orders placed during the hospital encounter of 01/07/12 (from the past 24 hour(s))  URINALYSIS, ROUTINE W REFLEX MICROSCOPIC     Status: Normal   Collection Time   01/07/12 12:40 AM      Component Value Range   Color, Urine YELLOW  YELLOW   APPearance CLEAR  CLEAR   Specific Gravity, Urine 1.025  1.005 - 1.030   pH 6.0  5.0 -  8.0   Glucose, UA NEGATIVE  NEGATIVE mg/dL   Hgb urine dipstick NEGATIVE  NEGATIVE   Bilirubin Urine NEGATIVE  NEGATIVE   Ketones, ur NEGATIVE  NEGATIVE mg/dL   Protein, ur NEGATIVE  NEGATIVE mg/dL   Urobilinogen, UA 0.2  0.0 - 1.0 mg/dL   Nitrite NEGATIVE  NEGATIVE   Leukocytes, UA NEGATIVE  NEGATIVE  WET PREP, GENITAL     Status: Abnormal   Collection Time   01/07/12  1:00 AM      Component Value Range   Yeast Wet Prep HPF POC NONE SEEN  NONE SEEN   Trich, Wet Prep NONE SEEN  NONE SEEN   Clue Cells Wet Prep HPF POC MODERATE (*) NONE SEEN   WBC, Wet Prep HPF POC FEW (*) NONE SEEN    Assessment and Plan   1. BV (bacterial vaginosis)   Flagyl  500mg  PO BID FU with PCP as needed.   Tawnya Crook 01/07/2012, 12:54 AM

## 2012-01-07 NOTE — MAU Note (Signed)
I had an infection about 2mos ago and was seen here. Took antibiotic. Have had some cramping but saw spotting yest and today but none in past few hrs.

## 2012-02-08 ENCOUNTER — Encounter (HOSPITAL_COMMUNITY): Payer: Self-pay | Admitting: *Deleted

## 2012-02-08 ENCOUNTER — Inpatient Hospital Stay (HOSPITAL_COMMUNITY)
Admission: AD | Admit: 2012-02-08 | Discharge: 2012-02-08 | Disposition: A | Discharge status: Home / Self Care | Payer: Self-pay | Source: Ambulatory Visit | Attending: Obstetrics & Gynecology | Admitting: Obstetrics & Gynecology

## 2012-02-08 DIAGNOSIS — A499 Bacterial infection, unspecified: Secondary | ICD-10-CM

## 2012-02-08 DIAGNOSIS — O0930 Supervision of pregnancy with insufficient antenatal care, unspecified trimester: Secondary | ICD-10-CM | POA: Insufficient documentation

## 2012-02-08 DIAGNOSIS — N760 Acute vaginitis: Secondary | ICD-10-CM

## 2012-02-08 DIAGNOSIS — B9689 Other specified bacterial agents as the cause of diseases classified elsewhere: Secondary | ICD-10-CM

## 2012-02-08 DIAGNOSIS — M545 Low back pain, unspecified: Secondary | ICD-10-CM | POA: Insufficient documentation

## 2012-02-08 DIAGNOSIS — O99891 Other specified diseases and conditions complicating pregnancy: Secondary | ICD-10-CM | POA: Insufficient documentation

## 2012-02-08 DIAGNOSIS — O99019 Anemia complicating pregnancy, unspecified trimester: Secondary | ICD-10-CM

## 2012-02-08 DIAGNOSIS — R1031 Right lower quadrant pain: Secondary | ICD-10-CM | POA: Insufficient documentation

## 2012-02-08 DIAGNOSIS — O26899 Other specified pregnancy related conditions, unspecified trimester: Secondary | ICD-10-CM

## 2012-02-08 HISTORY — DX: Unspecified ovarian cyst, unspecified side: N83.209

## 2012-02-08 LAB — WET PREP, GENITAL

## 2012-02-08 LAB — URINALYSIS, ROUTINE W REFLEX MICROSCOPIC
Glucose, UA: NEGATIVE mg/dL
Leukocytes, UA: NEGATIVE
Protein, ur: NEGATIVE mg/dL
Urobilinogen, UA: 0.2 mg/dL (ref 0.0–1.0)

## 2012-02-08 LAB — CBC WITH DIFFERENTIAL/PLATELET
Basophils Absolute: 0 10*3/uL (ref 0.0–0.1)
Basophils Relative: 0 % (ref 0–1)
Eosinophils Absolute: 0.2 10*3/uL (ref 0.0–0.7)
Eosinophils Relative: 2 % (ref 0–5)
HCT: 28.9 % — ABNORMAL LOW (ref 36.0–46.0)
Hemoglobin: 9.9 g/dL — ABNORMAL LOW (ref 12.0–15.0)
Lymphs Abs: 2.3 10*3/uL (ref 0.7–4.0)
MCH: 33.3 pg (ref 26.0–34.0)
MCV: 97.3 fL (ref 78.0–100.0)
Monocytes Absolute: 0.5 10*3/uL (ref 0.1–1.0)
Monocytes Relative: 6 % (ref 3–12)
RBC: 2.97 MIL/uL — ABNORMAL LOW (ref 3.87–5.11)

## 2012-02-08 MED ORDER — HYDROCODONE-ACETAMINOPHEN 5-325 MG PO TABS
1.0000 | ORAL_TABLET | Freq: Once | ORAL | Status: AC
  Administered 2012-02-08: 1 via ORAL
  Filled 2012-02-08: qty 1

## 2012-02-08 MED ORDER — METRONIDAZOLE 500 MG PO TABS: 500.0000 mg | ORAL_TABLET | Freq: Two times a day (BID) | ORAL | Status: DC

## 2012-02-08 NOTE — MAU Provider Note (Signed)
History     CSN: [id number]  Arrival date and time: 02/08/12 4098   First Provider Initiated Contact with Patient 02/08/12 0104      Chief Complaint  Patient presents with  . Abdominal Pain   HPI Amber [name] is a 21 y.o. female @ [redacted]w[redacted]d gestation who presents to MAU with abdominal pain. The pain started yesterday. She describes the pain as constant cramping, sharp. The pain is located on the right side of the abdomen. She rates the pain as 8/10. Had similar pain on past visit here and treated for BV. She denies vaginal bleeding or change in vaginal discharge. Has not started prenatal care due to no insurance yet. Plans care at [facility]. The history was provided by the patient. OB History    Grav Para Term Preterm Abortions TAB SAB Ect Mult Living   2    1  1          Past Medical History  Diagnosis Date  . Interstitial cystitis   . Protein in urine   . Abnormal Pap smear   . Ovarian cyst     Past Surgical History  Procedure Date  . Dilation and curettage of uterus     Family History  Problem Relation Age of Onset  . Cancer Maternal Uncle   . Other Neg Hx     History  Substance Use Topics  . Smoking status: Former Smoker -- 1.0 packs/day    Types: Cigarettes  . Smokeless tobacco: Never Used  . Alcohol Use: No     Comment: occ    Allergies:  Allergies  Allergen Reactions  . Clarithromycin Anaphylaxis    Prescriptions prior to admission  Medication Sig Dispense Refill  . acetaminophen (TYLENOL) 325 MG tablet Take 650 mg by mouth every 6 (six) hours as needed.      . loratadine (CLARITIN) 10 MG tablet Take 10 mg by mouth 2 (two) times daily as needed. For allergies.      Marland Kitchen HYDROcodone-acetaminophen (NORCO) 10-325 MG per tablet Take 1 tablet by mouth every 6 (six) hours as needed. For pain.  16 tablet  0  . metroNIDAZOLE (FLAGYL) 500 MG tablet Take 1 tablet (500 mg total) by mouth 2 (two) times daily.  14 tablet  0  . promethazine (PHENERGAN) 25 MG tablet  Take 0.5 tablets (12.5 mg total) by mouth every 6 (six) hours as needed for nausea.  20 tablet  0    Review of Systems  Constitutional: Negative for fever, chills and weight loss.  HENT: Negative for ear pain, nosebleeds, congestion, sore throat and neck pain.   Eyes: Negative for blurred vision, double vision, photophobia and pain.  Respiratory: Negative for cough, shortness of breath and wheezing.   Cardiovascular: Negative for chest pain, palpitations and leg swelling.  Gastrointestinal: Positive for nausea and abdominal pain. Negative for heartburn, vomiting, diarrhea and constipation.  Genitourinary: Positive for frequency. Negative for dysuria and urgency.  Musculoskeletal: Positive for back pain. Negative for myalgias.  Skin: Negative for itching and rash.  Neurological: Negative for dizziness, sensory change, speech change, seizures, weakness and headaches.  Endo/Heme/Allergies: Does not bruise/bleed easily.  Psychiatric/Behavioral: Negative for depression and substance abuse. The patient is not nervous/anxious and does not have insomnia.    Physical Exam   Blood pressure 121/61, pulse 106, temperature 97.9 F (36.6 C), temperature source Oral, resp. rate 16, height 5\' 5"  (1.651 m), weight 132 lb (59.875 kg), last menstrual period 09/24/2011.  Physical Exam  Nursing note and vitals reviewed. Constitutional: She is oriented to person, place, and time. She appears well-developed and well-nourished. No distress.  HENT:  Head: Normocephalic and atraumatic.  Eyes: EOM are normal.  Neck: Neck supple.  Cardiovascular:       tachycardia  Respiratory: Effort normal.  GI: Soft. There is tenderness in the right lower quadrant. There is no rigidity, no guarding and no CVA tenderness.       Positive FHT's  Genitourinary:       External genitalia without lesions. White discharge vaginal vault. Cervix long, closed, posterior. Uterus approximately 20 wk. Size.  Musculoskeletal: Normal  range of motion. She exhibits no edema.  Neurological: She is alert and oriented to person, place, and time.  Skin: Skin is warm and dry.  Psychiatric: She has a normal mood and affect. Her behavior is normal. Judgment and thought content normal.   Procedures  Results for orders placed during the hospital encounter of 02/08/12 (from the past 24 hour(s))  URINALYSIS, ROUTINE W REFLEX MICROSCOPIC     Status: Abnormal   Collection Time   02/08/12 12:30 AM      Component Value Range   Color, Urine YELLOW  YELLOW   APPearance CLEAR  CLEAR   Specific Gravity, Urine <1.005 (*) 1.005 - 1.030   pH 6.5  5.0 - 8.0   Glucose, UA NEGATIVE  NEGATIVE mg/dL   Hgb urine dipstick NEGATIVE  NEGATIVE   Bilirubin Urine NEGATIVE  NEGATIVE   Ketones, ur NEGATIVE  NEGATIVE mg/dL   Protein, ur NEGATIVE  NEGATIVE mg/dL   Urobilinogen, UA 0.2  0.0 - 1.0 mg/dL   Nitrite NEGATIVE  NEGATIVE   Leukocytes, UA NEGATIVE  NEGATIVE  WET PREP, GENITAL     Status: Abnormal   Collection Time   02/08/12  1:00 AM      Component Value Range   Yeast Wet Prep HPF POC RARE (*) NONE SEEN   Trich, Wet Prep NONE SEEN  NONE SEEN   Clue Cells Wet Prep HPF POC MODERATE (*) NONE SEEN   WBC, Wet Prep HPF POC FEW (*) NONE SEEN  CBC WITH DIFFERENTIAL     Status: Abnormal   Collection Time   02/08/12  1:10 AM      Component Value Range   WBC 8.3  4.0 - 10.5 K/uL   RBC 2.97 (*) 3.87 - 5.11 MIL/uL   Hemoglobin 9.9 (*) 12.0 - 15.0 g/dL   HCT 40.9 (*) 81.1 - 91.4 %   MCV 97.3  78.0 - 100.0 fL   MCH 33.3  26.0 - 34.0 pg   MCHC 34.3  30.0 - 36.0 g/dL   RDW 78.2  95.6 - 21.3 %   Platelets 184  150 - 400 K/uL   Neutrophils Relative 63  43 - 77 %   Neutro Abs 5.2  1.7 - 7.7 K/uL   Lymphocytes Relative 28  12 - 46 %   Lymphs Abs 2.3  0.7 - 4.0 K/uL   Monocytes Relative 6  3 - 12 %   Monocytes Absolute 0.5  0.1 - 1.0 K/uL   Eosinophils Relative 2  0 - 5 %   Eosinophils Absolute 0.2  0.0 - 0.7 K/uL   Basophils Relative 0  0 - 1 %    Basophils Absolute 0.0  0.0 - 0.1 K/uL   Assessment: 21 y.o. female @ [redacted]w[redacted]d gestation with abdominal pain   Round ligament pain   Bacterial vaginosis   Anemia  Plan:  Hydrocodone 5/325 mg. PO now   Rx Flagyl   Discussed with patient tylenol for discomfort, instructions re: BV, anemia and RLP   Discussed need for [facility] as soon as possible. Plans care with [facility]. If unable to   get appointment with [facility] will call OB[facility]GYN Clinic here at [facility]. Message   sent to Dr. Emelda Fear.  Discussed with the patient and all questioned fully answered. She will return if any problems arise.   Medication List     As of 02/13/2012 12:25 AM    CONTINUE taking these medications         acetaminophen 325 MG tablet   Commonly known as: TYLENOL      loratadine 10 MG tablet   Commonly known as: CLARITIN      metroNIDAZOLE 500 MG tablet   Commonly known as: FLAGYL   Take 1 tablet (500 mg total) by mouth 2 (two) times daily.      promethazine 25 MG tablet   Commonly known as: PHENERGAN   Take 0.5 tablets (12.5 mg total) by mouth every 6 (six) hours as needed for nausea.      STOP taking these medications         HYDROcodone-acetaminophen 10-325 MG per tablet   Commonly known as: NORCO          Where to get your medications    These are the prescriptions that you need to pick up. We sent them to a specific pharmacy, so you will need to go there to get them.   [id number] [place], [place] - [address]    [address] [place] [address]    Phone: [phone]        metroNIDAZOLE 500 MG tablet            ,, RN, FNP, [facility], [facility] 02/08/2012, 1:04 AM

## 2012-02-08 NOTE — MAU Note (Signed)
R side abd. Pain for 2 days. Pt also reports lower back pain which is worse on the R side. Pt took Tylernol @ 1600 w/ no relief. Pt denies fever or chills. Denies bleeding or vag. Discharge.

## 2012-02-08 NOTE — MAU Note (Signed)
Pt G2 P0 at 19.4wks, no prenatal care, waiting for medicaid.  Having RLQ pain x 2 days.  Denies bleeding.

## 2012-02-13 NOTE — MAU Provider Note (Signed)
Attestation of Attending Supervision of Advanced Practitioner (CNM/NP): Evaluation and management procedures were performed by the Advanced Practitioner under my supervision and collaboration.  I have reviewed the Advanced Practitioner's note and chart, and I agree with the management and plan.  HARRAWAY-SMITH,  5:58 AM

## 2012-03-07 NOTE — L&D Delivery Note (Signed)
Delivery Note At 1:41 PM a viable female was delivered via Vaginal, Spontaneous Delivery (Presentation: Left Occiput Anterior).  APGAR: 9, 9; weight: not available at time of note  .   Placenta status: delivered Spontaneous, intact.  Cord: 3 vessels with the following complications: None.  Cord pH: not done  Anesthesia: Epidural  Episiotomy: None Lacerations: Periurethral;Labial;1st degree w/ repair Suture Repair: 4.0 monocryl Est. Blood Loss (mL): 300   Red robin cath app urine  Mom to postpartum.  Baby to nursery-stable. Bottlefeeding, thinking about nuva-ring for contraception.  Marge Duncans 06/30/2012, 2:21 PM

## 2012-04-08 ENCOUNTER — Encounter (HOSPITAL_COMMUNITY): Payer: Self-pay | Admitting: Obstetrics and Gynecology

## 2012-04-08 ENCOUNTER — Inpatient Hospital Stay (HOSPITAL_COMMUNITY)
Admission: AD | Admit: 2012-04-08 | Discharge: 2012-04-08 | Disposition: A | Discharge status: Home / Self Care | Payer: Medicaid Other | Source: Ambulatory Visit | Attending: Obstetrics & Gynecology | Admitting: Obstetrics & Gynecology

## 2012-04-08 DIAGNOSIS — R05 Cough: Secondary | ICD-10-CM | POA: Insufficient documentation

## 2012-04-08 DIAGNOSIS — R109 Unspecified abdominal pain: Secondary | ICD-10-CM | POA: Insufficient documentation

## 2012-04-08 DIAGNOSIS — J40 Bronchitis, not specified as acute or chronic: Secondary | ICD-10-CM

## 2012-04-08 DIAGNOSIS — R0989 Other specified symptoms and signs involving the circulatory and respiratory systems: Secondary | ICD-10-CM | POA: Insufficient documentation

## 2012-04-08 DIAGNOSIS — O99891 Other specified diseases and conditions complicating pregnancy: Secondary | ICD-10-CM | POA: Insufficient documentation

## 2012-04-08 DIAGNOSIS — R059 Cough, unspecified: Secondary | ICD-10-CM | POA: Insufficient documentation

## 2012-04-08 MED ORDER — AMOXICILLIN 875 MG PO TABS: 875.0000 mg | ORAL_TABLET | Freq: Two times a day (BID) | ORAL | Status: DC

## 2012-04-08 MED ORDER — PROMETHAZINE-CODEINE 6.25-10 MG/5ML PO SYRP: 5.0000 mL | ORAL_SOLUTION | ORAL | Status: DC | PRN

## 2012-04-08 NOTE — MAU Note (Signed)
Patient presents to MAU with c/o productive cough w yellow sputum x 1 week; Dr. Emelda Fear reccommended Robitussin on Friday; cough unrelieved; last dose of Robitussin last night.  Denies fever, chills. Denies vaginal bleeding, LOF or discharge.  Reports + fetal movement; Reports coughing spells are making abdomen sore.

## 2012-04-08 NOTE — MAU Note (Signed)
Amber [name] is [redacted]w[redacted]d, here with cough, congestion and abdominal pain. [facility] and they told her to take robitussin for the cough. She feels worse today and feels like nothing is helping her. She checked her temp yesterday and it was 99.0; she took tylenol.

## 2012-04-08 NOTE — ED Notes (Signed)
History     CSN: [id number]  Arrival date & time 04/08/12  1023   None     Chief Complaint  Patient presents with  . Cough  . Nasal Congestion  . Abdominal Pain    HPI 22 yo G2P0010 at 28.1 wga who presents with 1 week of productive cough and nasal congestion. States that she was seen at [facility] on Friday and was advised to take Robitussin. Cough continues to persist and may be worst despite robitussin. Last dose of robitussin last night. Took one dose today but threw it up after episode of post tussive emesis. Denies any shortness of breath. Had temperature of 99 last night and took tylenol. No chills. No sick contacts. No myalgias.   No vaginal bleeding, no abnormal discharge or loss of fluid. Feeling fetal movement. Reports some abdominal discomfort that she thinks is related to cough. No contractions.   Has not received flu shot.   Past Medical History  Diagnosis Date  . Interstitial cystitis   . Protein in urine   . Abnormal Pap smear   . Ovarian cyst     Past Surgical History  Procedure Date  . Dilation and curettage of uterus     Family History  Problem Relation Age of Onset  . Cancer Maternal Uncle   . Other Neg Hx     History  Substance Use Topics  . Smoking status: Former Smoker -- 1.0 packs/day    Types: Cigarettes  . Smokeless tobacco: Never Used  . Alcohol Use: No     Comment: occ    OB History    Grav Para Term Preterm Abortions TAB SAB Ect Mult Living   2    1  1    0      Review of Systems Negative except per HPI Allergies  Clarithromycin  Home Medications  No current outpatient prescriptions on file.  BP 131/63  Pulse 83  Temp 98.1 F (36.7 C) (Oral)  Resp 20  Ht 5\' 4"  (1.626 m)  Wt 139 lb (63.05 kg)  BMI 23.86 kg/m2  LMP 09/24/2011  Physical Exam Physical Examination: General appearance - alert, well appearing, and in no distress Mental status - alert, oriented to person, place, and time Nose - erythematous and congested  nasal turbinates R>L, no maxillary sinus pressure tenderness.  Mouth - mucous membranes moist, pharynx normal without lesions Neck - supple, right tender cervical lymph node Chest - clear to auscultation, no wheezes, rales or rhonchi, symmetric air entry Heart - normal rate, regular rhythm, normal S1, S2, no murmurs, rubs, clicks or gallops Abdomen - gravid, non tender Skin - normal coloration and turgor, no rashes, no suspicious skin lesions noted  MAU Course  Procedures (including critical care time)  Labs Reviewed - No data to display No results found.  1. Bronchitis  MDM   Cough and congestion x 1 week, not improving. No hypoxia, no wheezing on exam. No contraction on strip. Will treat for bronchitis with amoxicillin (allergic to clarithromycin), since symptoms are not improving and maybe even worst. Phenergan with codeine for nausea and cough.  Patient to follow up with [facility] next week.    I saw and examined patient and agree with above. Pt denies wheezing, shortness of breath or history of asthma. Treat with antibiotics since going on one week with worsening symptoms. Pt allergic to azithromycin class of abx. Rx for amoxicillin. Phenergan-codeine for night-time cough suppression. F/U if wheezing, difficulty breathing, not improving. Rinaldo Cloud  Thad Ranger, MD

## 2012-04-17 ENCOUNTER — Encounter: Payer: Self-pay | Admitting: Adult Health

## 2012-04-17 DIAGNOSIS — N3010 Interstitial cystitis (chronic) without hematuria: Secondary | ICD-10-CM | POA: Insufficient documentation

## 2012-04-17 DIAGNOSIS — B951 Streptococcus, group B, as the cause of diseases classified elsewhere: Secondary | ICD-10-CM

## 2012-04-17 HISTORY — DX: Unspecified infection of urinary tract in pregnancy, unspecified trimester: B95.1

## 2012-04-17 HISTORY — DX: Interstitial cystitis (chronic) without hematuria: N30.10

## 2012-05-07 ENCOUNTER — Encounter (HOSPITAL_COMMUNITY): Payer: Self-pay | Admitting: *Deleted

## 2012-05-07 ENCOUNTER — Inpatient Hospital Stay (HOSPITAL_COMMUNITY)
Admission: AD | Admit: 2012-05-07 | Discharge: 2012-05-07 | Disposition: A | Discharge status: Home / Self Care | Payer: Medicaid Other | Source: Ambulatory Visit | Attending: Family Medicine | Admitting: Family Medicine

## 2012-05-07 DIAGNOSIS — N390 Urinary tract infection, site not specified: Secondary | ICD-10-CM | POA: Insufficient documentation

## 2012-05-07 DIAGNOSIS — O2390 Unspecified genitourinary tract infection in pregnancy, unspecified trimester: Secondary | ICD-10-CM | POA: Insufficient documentation

## 2012-05-07 DIAGNOSIS — R109 Unspecified abdominal pain: Secondary | ICD-10-CM | POA: Insufficient documentation

## 2012-05-07 DIAGNOSIS — O2343 Unspecified infection of urinary tract in pregnancy, third trimester: Secondary | ICD-10-CM

## 2012-05-07 DIAGNOSIS — M7918 Myalgia, other site: Secondary | ICD-10-CM

## 2012-05-07 DIAGNOSIS — M545 Low back pain, unspecified: Secondary | ICD-10-CM | POA: Insufficient documentation

## 2012-05-07 DIAGNOSIS — O99891 Other specified diseases and conditions complicating pregnancy: Secondary | ICD-10-CM | POA: Insufficient documentation

## 2012-05-07 DIAGNOSIS — M549 Dorsalgia, unspecified: Secondary | ICD-10-CM

## 2012-05-07 LAB — CBC
Hemoglobin: 10.1 g/dL — ABNORMAL LOW (ref 12.0–15.0)
MCH: 33.7 pg (ref 26.0–34.0)
Platelets: 211 10*3/uL (ref 150–400)
RBC: 3 MIL/uL — ABNORMAL LOW (ref 3.87–5.11)
WBC: 12 10*3/uL — ABNORMAL HIGH (ref 4.0–10.5)

## 2012-05-07 LAB — URINE MICROSCOPIC-ADD ON

## 2012-05-07 LAB — URINALYSIS, ROUTINE W REFLEX MICROSCOPIC
Bilirubin Urine: NEGATIVE
Nitrite: NEGATIVE
Specific Gravity, Urine: 1.01 (ref 1.005–1.030)
Urobilinogen, UA: 0.2 mg/dL (ref 0.0–1.0)
pH: 6.5 (ref 5.0–8.0)

## 2012-05-07 MED ORDER — OXYCODONE-ACETAMINOPHEN 5-325 MG PO TABS
2.0000 | ORAL_TABLET | ORAL | Status: AC
  Administered 2012-05-07: 2 via ORAL
  Filled 2012-05-07: qty 2

## 2012-05-07 MED ORDER — PHENAZOPYRIDINE HCL 100 MG PO TABS: 100.0000 mg | ORAL_TABLET | Freq: Three times a day (TID) | ORAL | Status: DC | PRN

## 2012-05-07 MED ORDER — PHENAZOPYRIDINE HCL 100 MG PO TABS
100.0000 mg | ORAL_TABLET | ORAL | Status: AC
  Administered 2012-05-07: 100 mg via ORAL
  Filled 2012-05-07: qty 1

## 2012-05-07 MED ORDER — CYCLOBENZAPRINE HCL 5 MG PO TABS: 5.0000 mg | ORAL_TABLET | Freq: Three times a day (TID) | ORAL | Status: DC | PRN

## 2012-05-07 MED ORDER — NITROFURANTOIN MONOHYD MACRO 100 MG PO CAPS: 100.0000 mg | ORAL_CAPSULE | Freq: Two times a day (BID) | ORAL | Status: DC

## 2012-05-07 MED ORDER — OXYCODONE-ACETAMINOPHEN 5-325 MG PO TABS: 1.0000 | ORAL_TABLET | Freq: Four times a day (QID) | ORAL | Status: DC | PRN

## 2012-05-07 NOTE — MAU Note (Signed)
Patient states she started having low back pain on 3-1 and getting worse last night with lower abdominal pain. Patient denies bleeding but has a white vaginal discharge that is more than usual. States she had some vomiting yesterday and once today. Reports good fetal movement.

## 2012-05-07 NOTE — MAU Provider Note (Signed)
Chief Complaint:  Abdominal Pain and Back Pain   First Provider Initiated Contact with Patient 05/07/12 1056      HPI: Amber [name] is a 22 y.o. G2P0010 at 73w2dwho presents to maternity admissions reporting back pain and abdominal pain.  Back pain Began yesterday evening. There was no known provoking event preceding the pain. The pain is located in her bilateral lower lumbar/sacral area. She describes it as sharp, constant, and made worse with movement. She took Tylenol before bed last night, which did not provide any relief. She works with disabled adults, which requires some lifting, and she has not worked for about 3 days prior to the onset of the pain.   Abdominal pain The pain is located in her pubic symphysis area and has been present for about a week, however, became worse last night. Now she describes it as dull and achy. She just completed Ampicillin for GBS cultured in her urine by her OB/GYN [facility]).   ROS  She denies dysuria, fever/chills. Urinating and defecating normally. She reports vomiting last night and this morning. No chest pain or dyspnea. Denies contractions, leakage of fluid or vaginal bleeding. Good fetal movement.   Pregnancy Course:   Past Medical History: Past Medical History  Diagnosis Date  . Interstitial cystitis   . Protein in urine   . Abnormal Pap smear   . Ovarian cyst   . IC (interstitial cystitis) 04/17/2012  . GBS (group B streptococcus) UTI complicating pregnancy 04/17/2012    Past obstetric history: OB History   Grav Para Term Preterm Abortions TAB SAB Ect Mult Living   2    1  1    0     # Outc Date GA Lbr Len/2nd Wgt Sex Del Anes PTL Lv   1 SAB            2 CUR               Past Surgical History: Past Surgical History  Procedure Laterality Date  . Dilation and curettage of uterus      Family History: Family History  Problem Relation Age of Onset  . Cancer Maternal Uncle   . Other Neg Hx   . Diabetes Maternal Grandmother    . Cancer Maternal Grandmother     colon  . Diabetes Paternal Grandmother   . Cancer Paternal Grandmother     bone    Social History: History  Substance Use Topics  . Smoking status: Former Smoker -- 1.00 packs/day    Types: Cigarettes  . Smokeless tobacco: Never Used  . Alcohol Use: No     Comment: occ    Allergies:  Allergies  Allergen Reactions  . Clarithromycin Anaphylaxis    Meds:  Prescriptions prior to admission  Medication Sig Dispense Refill  . acetaminophen (TYLENOL) 325 MG tablet Take 650 mg by mouth every 6 (six) hours as needed for pain.      . Prenatal Vit-Fe Fumarate-FA (PRENATAL MULTIVITAMIN) TABS Take 1 tablet by mouth daily.        ROS: Pertinent findings in history of present illness.  Physical Exam  Blood pressure 113/52, pulse 95, temperature 98.3 F (36.8 C), temperature source Oral, resp. rate 16, height 5\' 6"  (1.676 m), weight 65.227 kg (143 lb 12.8 oz), last menstrual period 09/24/2011, SpO2 100.00%. GENERAL: Well-developed, well-nourished female in mild distress.  HEENT: normocephalic HEART: normal rate RESP: normal effort ABDOMEN: Soft, mildly tender to palpation in RLQ, gravid appropriate for gestational age  BACK: no CVA tenderness, no tenderness to palpation in paraspinal muscles.  EXTREMITIES: Nontender, no edema NEURO: alert and oriented SPECULUM EXAM: NEFG, physiologic discharge, no blood, cervix clean Dilation: Closed Effacement (%): Thick Exam by:: L. Leftwich-Kirby CNM  FHT:  Baseline 135 , moderate variability, accelerations present, no decelerations Contractions: occasional, 1 in 30 minutes, palpating mild   Labs: Results for orders placed during the hospital encounter of 05/07/12 (from the past 24 hour(s))  URINALYSIS, ROUTINE W REFLEX MICROSCOPIC     Status: Abnormal   Collection Time    05/07/12 10:10 AM      Result Value Range   Color, Urine YELLOW  YELLOW   APPearance HAZY (*) CLEAR   Specific Gravity, Urine 1.010   1.005 - 1.030   pH 6.5  5.0 - 8.0   Glucose, UA NEGATIVE  NEGATIVE mg/dL   Hgb urine dipstick NEGATIVE  NEGATIVE   Bilirubin Urine NEGATIVE  NEGATIVE   Ketones, ur NEGATIVE  NEGATIVE mg/dL   Protein, ur NEGATIVE  NEGATIVE mg/dL   Urobilinogen, UA 0.2  0.0 - 1.0 mg/dL   Nitrite NEGATIVE  NEGATIVE   Leukocytes, UA SMALL (*) NEGATIVE  URINE MICROSCOPIC-ADD ON     Status: Abnormal   Collection Time    05/07/12 10:10 AM      Result Value Range   Squamous Epithelial / LPF FEW (*) RARE   WBC, UA 3-6  <3 WBC/hpf   RBC / HPF 0-2  <3 RBC/hpf   Bacteria, UA MANY (*) RARE   Casts GRANULAR CAST (*) NEGATIVE   Urine-Other MUCOUS PRESENT    CBC     Status: Abnormal   Collection Time    05/07/12 11:29 AM      Result Value Range   WBC 12.0 (*) 4.0 - 10.5 K/uL   RBC 3.00 (*) 3.87 - 5.11 MIL/uL   Hemoglobin 10.1 (*) 12.0 - 15.0 g/dL   HCT 16.1 (*) 09.6 - 04.5 %   MCV 98.3  78.0 - 100.0 fL   MCH 33.7  26.0 - 34.0 pg   MCHC 34.2  30.0 - 36.0 g/dL   RDW 40.9  81.1 - 91.4 %   Platelets 211  150 - 400 K/uL    Imaging:  No results found. ED Course   Assessment: 1. UTI in pregnancy, antepartum, third trimester   2. Back pain complicating pregnancy, third trimester   3. Musculoskeletal pain     Plan: Discharge home Flexeril PRN back pain Macrobid BID x7 days F/U with [facility] Return to MAU as needed     Medication List    TAKE these medications       acetaminophen 325 MG tablet  Commonly known as:  TYLENOL  Take 650 mg by mouth every 6 (six) hours as needed for pain.     cyclobenzaprine 5 MG tablet  Commonly known as:  FLEXERIL  Take 1 tablet (5 mg total) by mouth every 8 (eight) hours as needed for muscle spasms.     nitrofurantoin (macrocrystal-monohydrate) 100 MG capsule  Commonly known as:  MACROBID  Take 1 capsule (100 mg total) by mouth 2 (two) times daily.     oxyCODONE-acetaminophen 5-325 MG per tablet  Commonly known as:  PERCOCET/ROXICET  Take 1-2 tablets by  mouth every 6 (six) hours as needed for pain.     phenazopyridine 100 MG tablet  Commonly known as:  PYRIDIUM  Take 1 tablet (100 mg total) by mouth 3 (three) times daily  as needed for pain.     prenatal multivitamin Tabs  Take 1 tablet by mouth daily.         Sharen Counter Certified Nurse-Midwife 05/07/2012 1:19 PM

## 2012-05-08 LAB — URINE CULTURE

## 2012-05-10 NOTE — MAU Provider Note (Signed)
Chart reviewed and agree with management and plan.  

## 2012-05-23 ENCOUNTER — Telehealth: Payer: Self-pay | Admitting: *Deleted

## 2012-05-23 NOTE — Telephone Encounter (Signed)
Pt wants a note for work. 34 weeks preg and c/o backpain.

## 2012-05-23 NOTE — Telephone Encounter (Signed)
Patient needs an appointment before decision made on work status. Amber [name] 05/23/2012 12:36 PM

## 2012-05-23 NOTE — Telephone Encounter (Signed)
Patient has appt for 3/21 already.

## 2012-05-24 ENCOUNTER — Other Ambulatory Visit: Payer: Self-pay | Admitting: *Deleted

## 2012-05-25 ENCOUNTER — Ambulatory Visit (INDEPENDENT_AMBULATORY_CARE_PROVIDER_SITE_OTHER): Payer: Medicaid Other | Admitting: Obstetrics & Gynecology

## 2012-05-25 VITALS — BP 120/80 | Wt 147.0 lb

## 2012-05-25 DIAGNOSIS — Z331 Pregnant state, incidental: Secondary | ICD-10-CM

## 2012-05-25 DIAGNOSIS — Z1389 Encounter for screening for other disorder: Secondary | ICD-10-CM

## 2012-05-25 DIAGNOSIS — Z3480 Encounter for supervision of other normal pregnancy, unspecified trimester: Secondary | ICD-10-CM | POA: Insufficient documentation

## 2012-05-25 DIAGNOSIS — O365990 Maternal care for other known or suspected poor fetal growth, unspecified trimester, not applicable or unspecified: Secondary | ICD-10-CM

## 2012-05-25 DIAGNOSIS — Z3483 Encounter for supervision of other normal pregnancy, third trimester: Secondary | ICD-10-CM

## 2012-05-25 DIAGNOSIS — Z3490 Encounter for supervision of normal pregnancy, unspecified, unspecified trimester: Secondary | ICD-10-CM

## 2012-05-25 LAB — POCT URINALYSIS DIPSTICK
Blood, UA: NEGATIVE
Glucose, UA: NEGATIVE
Leukocytes, UA: NEGATIVE
Nitrite, UA: NEGATIVE

## 2012-05-25 NOTE — Patient Instructions (Signed)
Intrauterine Growth Restriction Intrauterine growth restriction (IUGR) means that the baby is smaller than normal at the time of the pregnancy or at birth. This should not be confused with Small for Gestational Age (SGA), which means the baby's weight at birth is at the lower end (less than 10%) of normal birth weights.  CAUSES  Medical problems with the mother:  High blood pressure.  Kidney, lung or heart disease.  Diabetes with arteriosclerosis.  Hemoglobinoathies- blood diseases.  Antiphospholipid antibody syndrome - a disorder of the immune system. Other causes:  Smoking, drug abuse and excessive alcohol drinking.  Diseases of the placenta.  Having twins or more.  Malnutrition.  Infections.  Genetic problems.  Pregnant women 16 years old or younger and pregnant women 35 years old or older.  Exposure to toxic chemicals. SYMPTOMS  Smaller than normal uterus when measuring the uterine size on the abdomen.  Ultrasound measurements of the fetuses head circumference, the abdominal circumference, the diameter of the biparietal area (sides) of the head and the length of the femur less than normal indicating IUGR. RISKS AND COMPLICATIONS  Fetal death in the uterus or a stillborn baby.  Not having enough fluid in the baby's sac (oligohydramnios).  Fetal heart rate problems. This leads to more Cesarean Section deliveries.  Low Apgar scores (evaluates the baby's condition at birth).  Increase in the acidity of the baby's blood (acidosis). SGA babies can also have complications such as:  Low blood sugar.  Increase of bilirubin in the blood.  Low body temperature (hypothermia)  Low Apgar scores, convulsions, fetal death and stillborn. TREATMENT   Close following and monitoring of the fetus during the pregnancy.  Treat infections that may be present.  Treat and control the medical disease present.  Look at the condition of the fetus with non-tress tests,  contraction stress tests and biophysical profile of the fetus.  Doppler ultrasound (measure the umbilical artery blood flow) lowers the risk of fetal death and stillborn by delivering the baby early if it is abnormal. HOME CARE INSTRUCTIONS   Follow your caregiver's advice, instructions and keep all of your prenatal appointments.  Get plenty of rest and sleep.  Eat a balanced diet and take all your vitamin and mineral supplements.  Do not over use your energy with hard exercise, work and household activities.  Do not exercise unless your caregiver says it is OK to do so.  Do not smoke, drink alcohol or take illegal drugs.  Avoid chemicals like pesticides. SEEK MEDICAL CARE IF:   You develop a temperature of 100 F (37.8 C) or higher.  You do not feel the baby moving as much or not at all.  You develop leaking of fluid from the vagina.  You develop vaginal bleeding.  You develop abdominal pain.  You develop uterine contractions. Document Released: 12/01/2007 Document Revised: 05/16/2011 Document Reviewed: 12/01/2007 ExitCare Patient Information 2013 [employer].  

## 2012-05-25 NOTE — Progress Notes (Signed)
Amber [name] is carrying quite low.  Measuring small as a result possibly but will do a sonogram next visit to make sure growth and fluid is good. Patient reports good fetal movement, denies any bleeding and no rupture of membranes symptoms or contraction.  Back pain tenderness but no other complaints.

## 2012-06-08 ENCOUNTER — Ambulatory Visit (INDEPENDENT_AMBULATORY_CARE_PROVIDER_SITE_OTHER): Payer: Medicaid Other | Admitting: Obstetrics & Gynecology

## 2012-06-08 ENCOUNTER — Other Ambulatory Visit: Payer: Self-pay | Admitting: Obstetrics & Gynecology

## 2012-06-08 ENCOUNTER — Ambulatory Visit (INDEPENDENT_AMBULATORY_CARE_PROVIDER_SITE_OTHER): Payer: Medicaid Other

## 2012-06-08 DIAGNOSIS — O365931 Maternal care for other known or suspected poor fetal growth, third trimester, fetus 1: Secondary | ICD-10-CM

## 2012-06-08 DIAGNOSIS — Z3483 Encounter for supervision of other normal pregnancy, third trimester: Secondary | ICD-10-CM

## 2012-06-08 DIAGNOSIS — O09299 Supervision of pregnancy with other poor reproductive or obstetric history, unspecified trimester: Secondary | ICD-10-CM

## 2012-06-08 DIAGNOSIS — F1920 Other psychoactive substance dependence, uncomplicated: Secondary | ICD-10-CM

## 2012-06-08 DIAGNOSIS — B951 Streptococcus, group B, as the cause of diseases classified elsewhere: Secondary | ICD-10-CM

## 2012-06-08 DIAGNOSIS — O99320 Drug use complicating pregnancy, unspecified trimester: Secondary | ICD-10-CM

## 2012-06-08 DIAGNOSIS — O365990 Maternal care for other known or suspected poor fetal growth, unspecified trimester, not applicable or unspecified: Secondary | ICD-10-CM

## 2012-06-08 DIAGNOSIS — F1910 Other psychoactive substance abuse, uncomplicated: Secondary | ICD-10-CM

## 2012-06-08 NOTE — Progress Notes (Signed)
U/S-vtx active fetus, EFW 5 lb 8 oz, 13th%tile, ant gr 1 plac, fluid wnl AFI=9.6cm, female fetus [place] Pigeon")

## 2012-06-08 NOTE — Progress Notes (Signed)
Unable to read urine dipstick due to pt taking pyridium.

## 2012-06-08 NOTE — Patient Instructions (Signed)
Intrauterine Growth Restriction Intrauterine growth restriction (IUGR) means that the baby is smaller than normal at the time of the pregnancy or at birth. This should not be confused with Small for Gestational Age (SGA), which means the baby's weight at birth is at the lower end (less than 10%) of normal birth weights.  CAUSES  Medical problems with the mother:  High blood pressure.  Kidney, lung or heart disease.  Diabetes with arteriosclerosis.  Hemoglobinoathies- blood diseases.  Antiphospholipid antibody syndrome - a disorder of the immune system. Other causes:  Smoking, drug abuse and excessive alcohol drinking.  Diseases of the placenta.  Having twins or more.  Malnutrition.  Infections.  Genetic problems.  Pregnant women 16 years old or younger and pregnant women 35 years old or older.  Exposure to toxic chemicals. SYMPTOMS  Smaller than normal uterus when measuring the uterine size on the abdomen.  Ultrasound measurements of the fetuses head circumference, the abdominal circumference, the diameter of the biparietal area (sides) of the head and the length of the femur less than normal indicating IUGR. RISKS AND COMPLICATIONS  Fetal death in the uterus or a stillborn baby.  Not having enough fluid in the baby's sac (oligohydramnios).  Fetal heart rate problems. This leads to more Cesarean Section deliveries.  Low Apgar scores (evaluates the baby's condition at birth).  Increase in the acidity of the baby's blood (acidosis). SGA babies can also have complications such as:  Low blood sugar.  Increase of bilirubin in the blood.  Low body temperature (hypothermia)  Low Apgar scores, convulsions, fetal death and stillborn. TREATMENT   Close following and monitoring of the fetus during the pregnancy.  Treat infections that may be present.  Treat and control the medical disease present.  Look at the condition of the fetus with non-tress tests,  contraction stress tests and biophysical profile of the fetus.  Doppler ultrasound (measure the umbilical artery blood flow) lowers the risk of fetal death and stillborn by delivering the baby early if it is abnormal. HOME CARE INSTRUCTIONS   Follow your caregiver's advice, instructions and keep all of your prenatal appointments.  Get plenty of rest and sleep.  Eat a balanced diet and take all your vitamin and mineral supplements.  Do not over use your energy with hard exercise, work and household activities.  Do not exercise unless your caregiver says it is OK to do so.  Do not smoke, drink alcohol or take illegal drugs.  Avoid chemicals like pesticides. SEEK MEDICAL CARE IF:   You develop a temperature of 100 F (37.8 C) or higher.  You do not feel the baby moving as much or not at all.  You develop leaking of fluid from the vagina.  You develop vaginal bleeding.  You develop abdominal pain.  You develop uterine contractions. Document Released: 12/01/2007 Document Revised: 05/16/2011 Document Reviewed: 12/01/2007 ExitCare Patient Information 2013 [employer].  

## 2012-06-08 NOTE — Progress Notes (Signed)
BP weight and urine results all reviewed and noted. Patient reports good fetal movement, denies any bleeding and no rupture of membranes symptoms or regular contractions. Patient is without complaints. All questions were answered. Sonogram is noted.  Please see the full report under imaging.  EFW 13%tile with good fluid.  Recommend repeat sonogram in 2 weeks to evaluate growth and fluid again, which has been ordered.

## 2012-06-09 LAB — GC/CHLAMYDIA PROBE AMP: GC Probe RNA: NEGATIVE

## 2012-06-11 LAB — US OB FOLLOW UP

## 2012-06-15 ENCOUNTER — Encounter: Payer: Medicaid Other | Admitting: Obstetrics and Gynecology

## 2012-06-19 ENCOUNTER — Encounter: Payer: Self-pay | Admitting: *Deleted

## 2012-06-19 ENCOUNTER — Encounter: Payer: Medicaid Other | Admitting: Obstetrics & Gynecology

## 2012-06-20 ENCOUNTER — Ambulatory Visit (INDEPENDENT_AMBULATORY_CARE_PROVIDER_SITE_OTHER): Payer: Medicaid Other | Admitting: Pediatrics

## 2012-06-20 DIAGNOSIS — Z7189 Other specified counseling: Secondary | ICD-10-CM

## 2012-06-20 NOTE — Progress Notes (Signed)
Prenatal care for new baby

## 2012-06-24 ENCOUNTER — Inpatient Hospital Stay (HOSPITAL_COMMUNITY)
Admission: AD | Admit: 2012-06-24 | Discharge: 2012-06-24 | Disposition: A | Discharge status: Home / Self Care | Payer: Medicaid Other | Source: Ambulatory Visit | Attending: Obstetrics & Gynecology | Admitting: Obstetrics & Gynecology

## 2012-06-24 ENCOUNTER — Encounter (HOSPITAL_COMMUNITY): Payer: Self-pay | Admitting: *Deleted

## 2012-06-24 DIAGNOSIS — N390 Urinary tract infection, site not specified: Secondary | ICD-10-CM | POA: Insufficient documentation

## 2012-06-24 DIAGNOSIS — Z3483 Encounter for supervision of other normal pregnancy, third trimester: Secondary | ICD-10-CM

## 2012-06-24 DIAGNOSIS — B951 Streptococcus, group B, as the cause of diseases classified elsewhere: Secondary | ICD-10-CM

## 2012-06-24 DIAGNOSIS — O2390 Unspecified genitourinary tract infection in pregnancy, unspecified trimester: Secondary | ICD-10-CM | POA: Insufficient documentation

## 2012-06-24 DIAGNOSIS — O479 False labor, unspecified: Secondary | ICD-10-CM | POA: Insufficient documentation

## 2012-06-24 DIAGNOSIS — O365990 Maternal care for other known or suspected poor fetal growth, unspecified trimester, not applicable or unspecified: Secondary | ICD-10-CM | POA: Insufficient documentation

## 2012-06-24 LAB — URINALYSIS, ROUTINE W REFLEX MICROSCOPIC
Glucose, UA: NEGATIVE mg/dL
Ketones, ur: NEGATIVE mg/dL
Nitrite: NEGATIVE
Protein, ur: NEGATIVE mg/dL
pH: 6 (ref 5.0–8.0)

## 2012-06-24 LAB — URINE MICROSCOPIC-ADD ON

## 2012-06-24 NOTE — MAU Note (Signed)
PRT SAYS SHE HAS BEEN HAVING UC'S  SINCE YESTERDAY- BUT WORSE  AT 10PM.    VE  2 WEKS AGO- CLOSED.  DENIES HSV AND MRSA.

## 2012-06-25 ENCOUNTER — Encounter: Payer: Self-pay | Admitting: *Deleted

## 2012-06-25 ENCOUNTER — Ambulatory Visit (INDEPENDENT_AMBULATORY_CARE_PROVIDER_SITE_OTHER): Payer: Medicaid Other

## 2012-06-25 ENCOUNTER — Encounter: Payer: Self-pay | Admitting: Women's Health

## 2012-06-25 ENCOUNTER — Ambulatory Visit (INDEPENDENT_AMBULATORY_CARE_PROVIDER_SITE_OTHER): Payer: Medicaid Other | Admitting: Women's Health

## 2012-06-25 VITALS — BP 118/80 | Wt 148.0 lb

## 2012-06-25 DIAGNOSIS — O365990 Maternal care for other known or suspected poor fetal growth, unspecified trimester, not applicable or unspecified: Secondary | ICD-10-CM

## 2012-06-25 DIAGNOSIS — Z3483 Encounter for supervision of other normal pregnancy, third trimester: Secondary | ICD-10-CM

## 2012-06-25 DIAGNOSIS — O2343 Unspecified infection of urinary tract in pregnancy, third trimester: Secondary | ICD-10-CM

## 2012-06-25 DIAGNOSIS — B951 Streptococcus, group B, as the cause of diseases classified elsewhere: Secondary | ICD-10-CM

## 2012-06-25 DIAGNOSIS — Z1389 Encounter for screening for other disorder: Secondary | ICD-10-CM

## 2012-06-25 DIAGNOSIS — Z331 Pregnant state, incidental: Secondary | ICD-10-CM

## 2012-06-25 DIAGNOSIS — O99320 Drug use complicating pregnancy, unspecified trimester: Secondary | ICD-10-CM | POA: Insufficient documentation

## 2012-06-25 DIAGNOSIS — O2340 Unspecified infection of urinary tract in pregnancy, unspecified trimester: Secondary | ICD-10-CM | POA: Insufficient documentation

## 2012-06-25 DIAGNOSIS — O99019 Anemia complicating pregnancy, unspecified trimester: Secondary | ICD-10-CM

## 2012-06-25 LAB — POCT URINALYSIS DIPSTICK
Glucose, UA: NEGATIVE
Protein, UA: NEGATIVE

## 2012-06-25 MED ORDER — CEPHALEXIN 500 MG PO CAPS: 500.0000 mg | ORAL_CAPSULE | Freq: Four times a day (QID) | ORAL | Status: DC

## 2012-06-25 NOTE — Addendum Note (Signed)
Addended by: Cheral Marker on: 06/25/2012 01:59 PM   Modules accepted: Orders

## 2012-06-25 NOTE — Progress Notes (Signed)
U/S(39+2wks)-active fetus, fluid wnl, EFW 6 lb 4 oz (2823gms 10th%tile), fluid wnl AFI=6.8cm, ant gr 2 plac, FHR=138 BPM

## 2012-06-25 NOTE — Progress Notes (Addendum)
U/S noted. Reports good fm. UC's since Sunday, now q , went to [facility] sun am was 1cm and d/c'd home. Denies lof, vb, urinary urgency, hesitancy, or dysuria.  Has had some frequency. No other complaints. Reviewed labor s/s and fetal kick counts. All questions answered.

## 2012-06-25 NOTE — Progress Notes (Signed)
Contractions are 7 minutes apart.

## 2012-06-25 NOTE — Patient Instructions (Addendum)
Braxton Hicks Contractions  Pregnancy is commonly associated with contractions of the uterus throughout the pregnancy. Towards the end of pregnancy (32 to 34 weeks), these contractions (Braxton Hicks) can develop more often and may become more forceful. This is not true labor because these contractions do not result in opening (dilatation) and thinning of the cervix. They are sometimes difficult to tell apart from true labor because these contractions can be forceful and people have different pain tolerances. You should not feel embarrassed if you go to the hospital with false labor. Sometimes, the only way to tell if you are in true labor is for your caregiver to follow the changes in the cervix.  How to tell the difference between true and false labor:  · False labor.  · The contractions of false labor are usually shorter, irregular and not as hard as those of true labor.  · They are often felt in the front of the lower abdomen and in the groin.  · They may leave with walking around or changing positions while lying down.  · They get weaker and are shorter lasting as time goes on.  · These contractions are usually irregular.  · They do not usually become progressively stronger, regular and closer together as with true labor.  · True labor.  · Contractions in true labor last 30 to 70 seconds, become very regular, usually become more intense, and increase in frequency.  · They do not go away with walking.  · The discomfort is usually felt in the top of the uterus and spreads to the lower abdomen and low back.  · True labor can be determined by your caregiver with an exam. This will show that the cervix is dilating and getting thinner.  If there are no prenatal problems or other health problems associated with the pregnancy, it is completely safe to be sent home with false labor and await the onset of true labor.  HOME CARE INSTRUCTIONS   · Keep up with your usual exercises and instructions.  · Take medications as  directed.  · Keep your regular prenatal appointment.  · Eat and drink lightly if you think you are going into labor.  · If BH contractions are making you uncomfortable:  · Change your activity position from lying down or resting to walking/walking to resting.  · Sit and rest in a tub of warm water.  · Drink 2 to 3 glasses of water. Dehydration may cause B-H contractions.  · Do slow and deep breathing several times an hour.  SEEK IMMEDIATE MEDICAL CARE IF:   · Your contractions continue to become stronger, more regular, and closer together.  · You have a gushing, burst or leaking of fluid from the vagina.  · An oral temperature above 102° F (38.9° C) develops.  · You have passage of blood-tinged mucus.  · You develop vaginal bleeding.  · You develop continuous belly (abdominal) pain.  · You have low back pain that you never had before.  · You feel the baby's head pushing down causing pelvic pressure.  · The baby is not moving as much as it used to.  Document Released: 02/21/2005 Document Revised: 05/16/2011 Document Reviewed: 08/15/2008  ExitCare® Patient Information ©2013 [employer].

## 2012-06-27 LAB — US OB FOLLOW UP

## 2012-06-29 ENCOUNTER — Encounter (HOSPITAL_COMMUNITY): Payer: Self-pay | Admitting: *Deleted

## 2012-06-29 ENCOUNTER — Telehealth (HOSPITAL_COMMUNITY): Payer: Self-pay | Admitting: *Deleted

## 2012-06-29 ENCOUNTER — Inpatient Hospital Stay (HOSPITAL_COMMUNITY)
Admission: AD | Admit: 2012-06-29 | Discharge: 2012-06-29 | Disposition: A | Discharge status: Home / Self Care | Payer: Medicaid Other | Source: Ambulatory Visit | Attending: Obstetrics & Gynecology | Admitting: Obstetrics & Gynecology

## 2012-06-29 DIAGNOSIS — O479 False labor, unspecified: Secondary | ICD-10-CM | POA: Insufficient documentation

## 2012-06-29 DIAGNOSIS — Z3483 Encounter for supervision of other normal pregnancy, third trimester: Secondary | ICD-10-CM

## 2012-06-29 DIAGNOSIS — O471 False labor at or after 37 completed weeks of gestation: Secondary | ICD-10-CM

## 2012-06-29 DIAGNOSIS — O99320 Drug use complicating pregnancy, unspecified trimester: Secondary | ICD-10-CM

## 2012-06-29 DIAGNOSIS — B951 Streptococcus, group B, as the cause of diseases classified elsewhere: Secondary | ICD-10-CM

## 2012-06-29 DIAGNOSIS — F1920 Other psychoactive substance dependence, uncomplicated: Secondary | ICD-10-CM

## 2012-06-29 MED ORDER — NALBUPHINE HCL 10 MG/ML IJ SOLN
10.0000 mg | INTRAMUSCULAR | Status: DC | PRN
  Administered 2012-06-29: 10 mg via INTRAMUSCULAR
  Filled 2012-06-29: qty 1

## 2012-06-29 NOTE — MAU Note (Signed)
Patient states she is having contractions but not sure how far apart they are, maybe 10 minutes. Patient denies leaking or bleeding and reports good fetal movement.

## 2012-06-29 NOTE — Telephone Encounter (Signed)
Preadmission screen  

## 2012-06-29 NOTE — MAU Provider Note (Signed)
History     CSN: [id number]  Arrival date and time: 06/29/12 1326   First Provider Initiated Contact with Patient 06/29/12 1545      Chief Complaint  Patient presents with  . Labor Eval   HPI 21 y.o. G2P0010 at [redacted]w[redacted]d with contractions for last 5 days, became stronger and more frequent this morning. No LOF or bleeding. Baby moving well. Care at [facility], last visit 4/21. Was 1.5/thick/-2.  [facility] patient. No complications of pregnancy Had an abnl quad screen (incr risk DS) but normal [place]. Ultrasound normal. 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   OB History   Grav Para Term Preterm Abortions TAB SAB Ect Mult Living   2    1  1    0      Past Medical History  Diagnosis Date  . Interstitial cystitis   . Protein in urine   . Abnormal Pap smear   . Ovarian cyst   . IC (interstitial cystitis) 04/17/2012  . GBS (group B streptococcus) UTI complicating pregnancy 04/17/2012    Past Surgical History  Procedure Laterality Date  . Dilation and curettage of uterus      Family History  Problem Relation Age of Onset  . Cancer Maternal Uncle   . Other Neg Hx   . Diabetes Maternal Grandmother   . Cancer Maternal Grandmother     colon  . Diabetes Paternal Grandmother   . Cancer Paternal Grandmother     bone    History  Substance Use Topics  . Smoking status: Former Smoker -- 1.00 packs/day    Types: Cigarettes    Quit date: 12/30/2011  . Smokeless tobacco: Never Used  . Alcohol Use: No     Comment: occ    Allergies:  Allergies  Allergen Reactions  . Clarithromycin Anaphylaxis    Prescriptions prior to admission  Medication Sig Dispense Refill  . acetaminophen (TYLENOL) 325 MG tablet Take 650 mg by mouth every 6 (six) hours as needed for pain.      . cephALEXin (KEFLEX) 500 MG capsule Take 1 capsule (500 mg total) by mouth 4 (four) times daily. X 7days  28 capsule  0  . cyclobenzaprine (FLEXERIL) 5 MG tablet Take 1 tablet (5 mg total) by mouth  every 8 (eight) hours as needed for muscle spasms.  30 tablet  1  . oxyCODONE-acetaminophen (PERCOCET/ROXICET) 5-325 MG per tablet Take 1-2 tablets by mouth every 6 (six) hours as needed for pain.  10 tablet  0  . Prenatal Vit-Fe Fumarate-FA (PRENATAL MULTIVITAMIN) TABS Take 1 tablet by mouth daily.        ROS Pertinent pos/neg in HPI.  Physical Exam   Blood pressure 131/70, pulse 85, temperature 98 F (36.7 C), temperature source Oral, resp. rate 20, height 5\' 5"  (1.651 m), weight 67.949 kg (149 lb 12.8 oz), last menstrual period 09/24/2011, SpO2 100.00%.  Physical Exam  Constitutional: She appears well-developed and well-nourished. She appears distressed.  HENT:  Head: Normocephalic and atraumatic.  Eyes: Conjunctivae and EOM are normal.  Neck: Normal range of motion. Neck supple.  Cardiovascular: Normal rate.   Respiratory: Effort normal. No respiratory distress.  GI: Soft. There is no rebound and no guarding.  Genitourinary:  Normal vagina, normal ext genitalia. Cervix 1.5/50/-2, posterior.   Musculoskeletal: Normal range of motion. She exhibits no edema and no tenderness.  Neurological: She is alert.  Skin: Skin is warm and dry.   FHTs:  145, mod var, accels present,  no decels TOCO: q 4-10 min.   Dilation: 1.5 Effacement (%): Thick Cervical Position: Posterior Station: -3 Presentation: Vertex Exam by:: Dr. Thad Ranger   MAU Course  Procedures  Given nubain 10 mg IM with some relief of pain.  Cervix unchanged after > 1 hour    Assessment and Plan  22 y.o. G2P0010 at [redacted]w[redacted]d with contractions FHTs reactive No cervical change Discharge home.   Napoleon Form 06/29/2012, 3:46 PM

## 2012-06-30 ENCOUNTER — Encounter (HOSPITAL_COMMUNITY): Payer: Self-pay | Admitting: Family

## 2012-06-30 ENCOUNTER — Inpatient Hospital Stay (HOSPITAL_COMMUNITY)
Admission: AD | Admit: 2012-06-30 | Discharge: 2012-07-02 | DRG: 775 | Disposition: A | Discharge status: Home / Self Care | Payer: Medicaid Other | Source: Ambulatory Visit | Attending: Obstetrics & Gynecology | Admitting: Obstetrics & Gynecology

## 2012-06-30 ENCOUNTER — Encounter (HOSPITAL_COMMUNITY): Payer: Self-pay | Admitting: Anesthesiology

## 2012-06-30 ENCOUNTER — Inpatient Hospital Stay (HOSPITAL_COMMUNITY): Payer: Medicaid Other | Admitting: Anesthesiology

## 2012-06-30 DIAGNOSIS — O0930 Supervision of pregnancy with insufficient antenatal care, unspecified trimester: Secondary | ICD-10-CM

## 2012-06-30 DIAGNOSIS — O365990 Maternal care for other known or suspected poor fetal growth, unspecified trimester, not applicable or unspecified: Secondary | ICD-10-CM

## 2012-06-30 DIAGNOSIS — Z3483 Encounter for supervision of other normal pregnancy, third trimester: Secondary | ICD-10-CM

## 2012-06-30 DIAGNOSIS — O99320 Drug use complicating pregnancy, unspecified trimester: Secondary | ICD-10-CM

## 2012-06-30 DIAGNOSIS — O9989 Other specified diseases and conditions complicating pregnancy, childbirth and the puerperium: Secondary | ICD-10-CM

## 2012-06-30 DIAGNOSIS — IMO0001 Reserved for inherently not codable concepts without codable children: Secondary | ICD-10-CM

## 2012-06-30 DIAGNOSIS — Z22330 Carrier of Group B streptococcus: Secondary | ICD-10-CM

## 2012-06-30 DIAGNOSIS — O2343 Unspecified infection of urinary tract in pregnancy, third trimester: Secondary | ICD-10-CM

## 2012-06-30 DIAGNOSIS — O99892 Other specified diseases and conditions complicating childbirth: Secondary | ICD-10-CM | POA: Diagnosis present

## 2012-06-30 LAB — RAPID URINE DRUG SCREEN, HOSP PERFORMED
Amphetamines: NOT DETECTED
Benzodiazepines: NOT DETECTED
Opiates: NOT DETECTED

## 2012-06-30 LAB — CBC
HCT: 36.8 % (ref 36.0–46.0)
Hemoglobin: 12.7 g/dL (ref 12.0–15.0)
MCH: 33.8 pg (ref 26.0–34.0)
MCV: 97.9 fL (ref 78.0–100.0)
Platelets: 176 10*3/uL (ref 150–400)
RBC: 3.76 MIL/uL — ABNORMAL LOW (ref 3.87–5.11)
WBC: 14.5 10*3/uL — ABNORMAL HIGH (ref 4.0–10.5)

## 2012-06-30 LAB — TYPE AND SCREEN

## 2012-06-30 LAB — RPR: RPR Ser Ql: NONREACTIVE

## 2012-06-30 LAB — ABO/RH: ABO/RH(D): O POS

## 2012-06-30 MED ORDER — PHENYLEPHRINE 40 MCG/ML (10ML) SYRINGE FOR IV PUSH (FOR BLOOD PRESSURE SUPPORT)
80.0000 ug | PREFILLED_SYRINGE | INTRAVENOUS | Status: DC | PRN
  Filled 2012-06-30: qty 5
  Filled 2012-06-30: qty 2

## 2012-06-30 MED ORDER — EPHEDRINE 5 MG/ML INJ
10.0000 mg | INTRAVENOUS | Status: DC | PRN
  Filled 2012-06-30: qty 4
  Filled 2012-06-30: qty 2

## 2012-06-30 MED ORDER — SODIUM CHLORIDE 0.9 % IV SOLN: 250.0000 mL | INTRAVENOUS | Status: DC | PRN

## 2012-06-30 MED ORDER — PENICILLIN G POTASSIUM 5000000 UNITS IJ SOLR
5.0000 10*6.[IU] | Freq: Once | INTRAVENOUS | Status: DC
  Filled 2012-06-30: qty 5

## 2012-06-30 MED ORDER — DIPHENHYDRAMINE HCL 50 MG/ML IJ SOLN: 12.5000 mg | INTRAMUSCULAR | Status: DC | PRN

## 2012-06-30 MED ORDER — ACETAMINOPHEN 325 MG PO TABS: 650.0000 mg | ORAL_TABLET | ORAL | Status: DC | PRN

## 2012-06-30 MED ORDER — MEASLES, MUMPS & RUBELLA VAC ~~LOC~~ INJ
0.5000 mL | INJECTION | Freq: Once | SUBCUTANEOUS | Status: DC
  Filled 2012-06-30: qty 0.5

## 2012-06-30 MED ORDER — OXYTOCIN BOLUS FROM INFUSION: 500.0000 mL | INTRAVENOUS | Status: DC

## 2012-06-30 MED ORDER — EPHEDRINE 5 MG/ML INJ
10.0000 mg | INTRAVENOUS | Status: DC | PRN
  Filled 2012-06-30: qty 2

## 2012-06-30 MED ORDER — SODIUM CHLORIDE 0.9 % IJ SOLN: 3.0000 mL | Freq: Two times a day (BID) | INTRAMUSCULAR | Status: DC

## 2012-06-30 MED ORDER — DIBUCAINE 1 % RE OINT: 1.0000 "application " | TOPICAL_OINTMENT | RECTAL | Status: DC | PRN

## 2012-06-30 MED ORDER — LACTATED RINGERS IV SOLN: INTRAVENOUS | Status: DC

## 2012-06-30 MED ORDER — PRENATAL MULTIVITAMIN CH
1.0000 | ORAL_TABLET | Freq: Every day | ORAL | Status: DC
  Administered 2012-07-01: 1 via ORAL
  Filled 2012-06-30: qty 1

## 2012-06-30 MED ORDER — LACTATED RINGERS IV SOLN: 500.0000 mL | Freq: Once | INTRAVENOUS | Status: DC

## 2012-06-30 MED ORDER — ONDANSETRON HCL 4 MG/2ML IJ SOLN: 4.0000 mg | Freq: Four times a day (QID) | INTRAMUSCULAR | Status: DC | PRN

## 2012-06-30 MED ORDER — FENTANYL 2.5 MCG/ML BUPIVACAINE 1/10 % EPIDURAL INFUSION (WH - ANES)
14.0000 mL/h | INTRAMUSCULAR | Status: DC | PRN
  Administered 2012-06-30: 14 mL/h via EPIDURAL
  Filled 2012-06-30: qty 125

## 2012-06-30 MED ORDER — OXYTOCIN 40 UNITS IN LACTATED RINGERS INFUSION - SIMPLE MED
62.5000 mL/h | INTRAVENOUS | Status: DC
  Administered 2012-06-30: 62.5 mL/h via INTRAVENOUS
  Filled 2012-06-30: qty 1000

## 2012-06-30 MED ORDER — FLEET ENEMA 7-19 GM/118ML RE ENEM: 1.0000 | ENEMA | Freq: Every day | RECTAL | Status: DC | PRN

## 2012-06-30 MED ORDER — OXYCODONE-ACETAMINOPHEN 5-325 MG PO TABS
1.0000 | ORAL_TABLET | ORAL | Status: DC | PRN
  Administered 2012-06-30 (×2): 1 via ORAL
  Administered 2012-07-01 (×2): 2 via ORAL
  Administered 2012-07-01 – 2012-07-02 (×2): 1 via ORAL
  Filled 2012-06-30: qty 1
  Filled 2012-06-30: qty 2
  Filled 2012-06-30 (×2): qty 1
  Filled 2012-06-30: qty 2
  Filled 2012-06-30: qty 1
  Filled 2012-06-30: qty 2

## 2012-06-30 MED ORDER — LIDOCAINE HCL (PF) 1 % IJ SOLN
INTRAMUSCULAR | Status: DC | PRN
  Administered 2012-06-30 (×2): 5 mL

## 2012-06-30 MED ORDER — LACTATED RINGERS IV SOLN
500.0000 mL | INTRAVENOUS | Status: DC | PRN
  Administered 2012-06-30: 1000 mL via INTRAVENOUS

## 2012-06-30 MED ORDER — DIPHENHYDRAMINE HCL 25 MG PO CAPS: 25.0000 mg | ORAL_CAPSULE | Freq: Four times a day (QID) | ORAL | Status: DC | PRN

## 2012-06-30 MED ORDER — IBUPROFEN 600 MG PO TABS: 600.0000 mg | ORAL_TABLET | Freq: Four times a day (QID) | ORAL | Status: DC | PRN

## 2012-06-30 MED ORDER — PENICILLIN G POTASSIUM 5000000 UNITS IJ SOLR
2.5000 10*6.[IU] | INTRAVENOUS | Status: DC
  Filled 2012-06-30: qty 2.5

## 2012-06-30 MED ORDER — SIMETHICONE 80 MG PO CHEW: 80.0000 mg | CHEWABLE_TABLET | ORAL | Status: DC | PRN

## 2012-06-30 MED ORDER — TETANUS-DIPHTH-ACELL PERTUSSIS 5-2.5-18.5 LF-MCG/0.5 IM SUSP: 0.5000 mL | Freq: Once | INTRAMUSCULAR | Status: DC

## 2012-06-30 MED ORDER — LIDOCAINE HCL (PF) 1 % IJ SOLN
30.0000 mL | INTRAMUSCULAR | Status: AC | PRN
  Administered 2012-06-30: 30 mL via SUBCUTANEOUS
  Filled 2012-06-30 (×2): qty 30

## 2012-06-30 MED ORDER — WITCH HAZEL-GLYCERIN EX PADS: 1.0000 "application " | MEDICATED_PAD | CUTANEOUS | Status: DC | PRN

## 2012-06-30 MED ORDER — ZOLPIDEM TARTRATE 5 MG PO TABS: 5.0000 mg | ORAL_TABLET | Freq: Every evening | ORAL | Status: DC | PRN

## 2012-06-30 MED ORDER — LANOLIN HYDROUS EX OINT: TOPICAL_OINTMENT | CUTANEOUS | Status: DC | PRN

## 2012-06-30 MED ORDER — IBUPROFEN 600 MG PO TABS
600.0000 mg | ORAL_TABLET | Freq: Four times a day (QID) | ORAL | Status: DC
  Administered 2012-06-30 – 2012-07-02 (×7): 600 mg via ORAL
  Filled 2012-06-30 (×7): qty 1

## 2012-06-30 MED ORDER — BISACODYL 10 MG RE SUPP: 10.0000 mg | Freq: Every day | RECTAL | Status: DC | PRN

## 2012-06-30 MED ORDER — CITRIC ACID-SODIUM CITRATE 334-500 MG/5ML PO SOLN: 30.0000 mL | ORAL | Status: DC | PRN

## 2012-06-30 MED ORDER — FENTANYL CITRATE 0.05 MG/ML IJ SOLN: 100.0000 ug | INTRAMUSCULAR | Status: DC | PRN

## 2012-06-30 MED ORDER — CEPHALEXIN 500 MG PO CAPS
500.0000 mg | ORAL_CAPSULE | Freq: Four times a day (QID) | ORAL | Status: DC
  Administered 2012-06-30 – 2012-07-02 (×7): 500 mg via ORAL
  Filled 2012-06-30 (×8): qty 1

## 2012-06-30 MED ORDER — SENNOSIDES-DOCUSATE SODIUM 8.6-50 MG PO TABS
2.0000 | ORAL_TABLET | Freq: Every day | ORAL | Status: DC
  Administered 2012-06-30 – 2012-07-01 (×2): 2 via ORAL

## 2012-06-30 MED ORDER — ONDANSETRON HCL 4 MG PO TABS: 4.0000 mg | ORAL_TABLET | ORAL | Status: DC | PRN

## 2012-06-30 MED ORDER — OXYCODONE-ACETAMINOPHEN 5-325 MG PO TABS: 1.0000 | ORAL_TABLET | ORAL | Status: DC | PRN

## 2012-06-30 MED ORDER — SODIUM CHLORIDE 0.9 % IJ SOLN: 3.0000 mL | INTRAMUSCULAR | Status: DC | PRN

## 2012-06-30 MED ORDER — PHENYLEPHRINE 40 MCG/ML (10ML) SYRINGE FOR IV PUSH (FOR BLOOD PRESSURE SUPPORT)
80.0000 ug | PREFILLED_SYRINGE | INTRAVENOUS | Status: DC | PRN
  Filled 2012-06-30: qty 2

## 2012-06-30 MED ORDER — FLEET ENEMA 7-19 GM/118ML RE ENEM: 1.0000 | ENEMA | RECTAL | Status: DC | PRN

## 2012-06-30 MED ORDER — BENZOCAINE-MENTHOL 20-0.5 % EX AERO
1.0000 "application " | INHALATION_SPRAY | CUTANEOUS | Status: DC | PRN
  Administered 2012-06-30: 1 via TOPICAL
  Filled 2012-06-30: qty 56

## 2012-06-30 MED ORDER — OXYTOCIN 40 UNITS IN LACTATED RINGERS INFUSION - SIMPLE MED: 62.5000 mL/h | INTRAVENOUS | Status: DC | PRN

## 2012-06-30 MED ORDER — ONDANSETRON HCL 4 MG/2ML IJ SOLN: 4.0000 mg | INTRAMUSCULAR | Status: DC | PRN

## 2012-06-30 MED ORDER — AMPICILLIN SODIUM 2 G IJ SOLR
2.0000 g | Freq: Once | INTRAMUSCULAR | Status: AC
  Administered 2012-06-30: 2 g via INTRAVENOUS
  Filled 2012-06-30: qty 2000

## 2012-06-30 NOTE — Progress Notes (Signed)
Educate pt and family members about importance of skin to skin--FOB took infant to couch and refused skin to skin--encouraged FOB to do skin to skin he became confrontational

## 2012-06-30 NOTE — Anesthesia Procedure Notes (Signed)
Epidural Patient location during procedure: OB Start time: 06/30/2012 11:55 AM  Staffing Anesthesiologist: Angus Seller., Harrell Gave. Performed by: anesthesiologist   Preanesthetic Checklist Completed: patient identified, site marked, surgical consent, pre-op evaluation, timeout performed, IV checked, risks and benefits discussed and monitors and equipment checked  Epidural Patient position: sitting Prep: site prepped and draped and DuraPrep Patient monitoring: continuous pulse ox and blood pressure Approach: midline Injection technique: LOR air and LOR saline  Needle:  Needle type: Tuohy  Needle gauge: 17 G Needle length: 9 cm and 9 Needle insertion depth: 5 cm cm Catheter type: closed end flexible Catheter size: 19 Gauge Catheter at skin depth: 10 cm Test dose: negative  Assessment Events: blood not aspirated, injection not painful, no injection resistance, negative IV test and no paresthesia  Additional Notes Patient identified.  Risk benefits discussed including failed block, incomplete pain control, headache, nerve damage, paralysis, blood pressure changes, nausea, vomiting, reactions to medication both toxic or allergic, and postpartum back pain.  Patient expressed understanding and wished to proceed.  All questions were answered.  Sterile technique used throughout procedure and epidural site dressed with sterile barrier dressing. No paresthesia or other complications noted.The patient did not experience any signs of intravascular injection such as tinnitus or metallic taste in mouth nor signs of intrathecal spread such as rapid motor block. Please see nursing notes for vital signs.

## 2012-06-30 NOTE — Anesthesia Preprocedure Evaluation (Signed)
Anesthesia Evaluation  Patient identified by MRN, date of birth, ID band Patient awake    Reviewed: Allergy & Precautions, H&P , Patient's Chart, lab work & pertinent test results  Airway Mallampati: II TM Distance: >3 FB Neck ROM: full    Dental no notable dental hx.    Pulmonary neg pulmonary ROS,  breath sounds clear to auscultation  Pulmonary exam normal       Cardiovascular negative cardio ROS  Rhythm:regular Rate:Normal     Neuro/Psych negative neurological ROS  negative psych ROS   GI/Hepatic negative GI ROS, Neg liver ROS,   Endo/Other  negative endocrine ROS  Renal/GU negative Renal ROS     Musculoskeletal   Abdominal   Peds  Hematology negative hematology ROS (+)   Anesthesia Other Findings Interstitial cystitis     Protein in urine        Abnormal Pap smear     Ovarian cyst        IC (interstitial cystitis) 04/17/2012   GBS (group B streptococcus) UTI complicating pregnancy    Reproductive/Obstetrics (+) Pregnancy                           Anesthesia Physical Anesthesia Plan  ASA: II  Anesthesia Plan: Epidural   Post-op Pain Management:    Induction:   Airway Management Planned:   Additional Equipment:   Intra-op Plan:   Post-operative Plan:   Informed Consent: I have reviewed the patients History and Physical, chart, labs and discussed the procedure including the risks, benefits and alternatives for the proposed anesthesia with the patient or authorized representative who has indicated his/her understanding and acceptance.     Plan Discussed with:   Anesthesia Plan Comments:         Anesthesia Quick Evaluation

## 2012-06-30 NOTE — Progress Notes (Signed)
Patient ID: Amber [name], female   DOB: 18-Aug-1990, 22 y.o.   MRN: [id number] S:   Called to room for prolonged decel as placing on EFM from MAU        Pt breathing well w/ uc's, wanting epidural  O:  FHR: baseline now 120, mod variability, no accels, prolonged decel of unknown duration- now resolved s/p position changes, fluid bolus, and O2 administration= Cat II       UCs: q 2-4, strong        SVE: 7/90/-1to0 w/ bbow  A:      Active labor- advanced dilation      Cat II FHR  P:      Change PCN to Ampicillin d/t advanced dilation      Continue to monitor FHR  Marge Duncans  11:33 AM

## 2012-06-30 NOTE — MAU Note (Signed)
Patient presents to MAU with c/o of contractions every 5 minutes since 0800 yesterday. Was seen at MAU for labor eval yesterday. Reports + fetal movement; denies vaginal bleeding, LOF.

## 2012-06-30 NOTE — H&P (Signed)
Amber [name] is a 22 y.o.G2P0010 female at [redacted]w[redacted]d by LMP and 19wk u/s, presenting in active labor.  Initial SVE 2cm, 1hr later 6cm w/ bbow- both exams by MAU RN. Reports uc's since yesterday- came to [facility] yest for labor check and was d/c'd home at 1.5cm as cervix did not change.   Denies lof or vb.  Reports good fm.  Initiated care at [facility] @ 19.5wks, AFP increased DSR 1:71 w/ neg Harmony, anatomy u/s normal, 2hr gtt normal 56/83/65, GBS bacteruria, UDS pos for benzo, THC, oxycodone multiple times throughout pregnancy.  4/21 U/S: efw 2823g/6lb 4oz/10% with AFI 6.8cm. On Keflex for UTI dx 4/21. H/O interstitial cystitis.   Maternal Medical History:  Reason for admission: Contractions.   Contractions: Onset was yesterday.   Frequency: regular.   Perceived severity is strong.    Fetal activity: Perceived fetal activity is normal.   Last perceived fetal movement was within the past hour.    Prenatal complications: Substance abuse (THC, benzo, oxycodone).   Late care @ 19.5wks  Prenatal Complications - Diabetes: none.    OB History   Grav Para Term Preterm Abortions TAB SAB Ect Mult Living   2    1  1    0     Past Medical History  Diagnosis Date  . Interstitial cystitis   . Protein in urine   . Abnormal Pap smear   . Ovarian cyst   . IC (interstitial cystitis) 04/17/2012  . GBS (group B streptococcus) UTI complicating pregnancy 04/17/2012   Past Surgical History  Procedure Laterality Date  . Dilation and curettage of uterus     Family History: family history includes Cancer in her maternal grandmother, maternal uncle, and paternal grandmother and Diabetes in her maternal grandmother and paternal grandmother.  There is no history of Other. Social History:  reports that she quit smoking about 6 months ago. Her smoking use included Cigarettes. She smoked 1.00 pack per day. She has never used smokeless tobacco. She reports that she does not drink alcohol or use illicit drugs.   Review of  Systems  Constitutional: Negative.   HENT: Negative.   Eyes: Negative.   Respiratory: Negative.   Cardiovascular: Negative.   Gastrointestinal: Positive for abdominal pain (r/t uc's).  Genitourinary: Negative.   Musculoskeletal: Negative.   Skin: Negative.   Neurological: Negative.   Endo/Heme/Allergies: Negative.   Psychiatric/Behavioral: Negative.     Dilation: 6 (BBOW) Effacement (%): 90 Station: -1 Exam by:: C. Millican, RN / G. Morris, RN  Blood pressure 122/77, pulse 64, temperature 98.2 F (36.8 C), temperature source Oral, resp. rate 18, height 5\' 5"  (1.651 m), weight 68.04 kg (150 lb), last menstrual period 09/24/2011. Maternal Exam:  Uterine Assessment: Contraction strength is moderate.  Contraction frequency is regular.   Abdomen: Patient reports no abdominal tenderness. Fetal presentation: vertex  Cervix: Cervix evaluated by digital exam.     Physical Exam  Constitutional: She is oriented to person, place, and time. She appears well-developed and well-nourished.  HENT:  Head: Normocephalic.  Neck: Normal range of motion.  Cardiovascular: Normal rate and regular rhythm.   Respiratory: Effort normal and breath sounds normal.  GI: Soft.  gravid  Genitourinary:  SVE by MAU RN  Musculoskeletal: Normal range of motion.  Neurological: She is alert and oriented to person, place, and time. She has normal reflexes.  Skin: Skin is warm and dry.  Psychiatric: She has a normal mood and affect. Her behavior is normal. Judgment and thought  content normal.    FHR: 130, mod variability, 15x15accels, no decels= Cat I UCs: q 2-5  Prenatal labs: ABO, Rh: --/--/O POS (09/16 0530) Antibody:  neg Rubella:   immune     RPR: NON REACTIVE (07/10 1818)  HBsAg:   neg HIV:   neg GBS: Positive (12/13 0000)   Assessment/Plan: A:  [redacted]w[redacted]d SIUP  Active labor  GBS pos  Cat I FHR  Abnormal AFP w/ normal Harmony  SGA @ 10% w/ low normal AFI  Late onset care  H/O THC, benzo,  oxycodone use  P:  Admit to BS    PCN per protocol for GBS pos  IV pain meds/epidural prn  UDS  Social work consult pp  Expectant management  Anticipate NSVD  Marge Duncans 06/30/2012, 10:46 AM

## 2012-06-30 NOTE — Progress Notes (Signed)
Amber [name] is a 22 y.o. G2P0010 at [redacted]w[redacted]d admitted for active labor  Subjective: Feeling lots of pelvic/rectal pressure, otherwise comfortable w/ epidural  Objective: BP 115/84  Pulse 68  Temp(Src) 98.2 F (36.8 C) (Oral)  Resp 18  Ht 5\' 5"  (1.651 m)  Wt 68.04 kg (150 lb)  BMI 24.96 kg/m2  SpO2 99%  LMP 09/24/2011      FHT:  FHR: 135 bpm, variability: moderate,  accelerations:  Present,  decelerations:  Absent UC:   regular, every 2-4 minutes SVE:   Dilation: Lip/rim Effacement (%): 100 Station: 0 Exam by:: kbooker, cnm S/p srom mod MSF @ 1140  Labs: Lab Results  Component Value Date   WBC 14.5* 06/30/2012   HGB 12.7 06/30/2012   HCT 36.8 06/30/2012   MCV 97.9 06/30/2012   PLT 176 06/30/2012    Assessment / Plan: Spontaneous labor, progressing normally  Labor: Progressing normally Preeclampsia:  n/a Fetal Wellbeing:  Category I Pain Control:  Epidural I/D:  ampicillin 2gm iv  for gbs pos advanced dilation Anticipated MOD:  NSVD  Marge Duncans 06/30/2012, 12:41 PM

## 2012-07-01 NOTE — Anesthesia Postprocedure Evaluation (Signed)
  Anesthesia Post-op Note  Patient: Amber [name]  Procedure(s) Performed: * No procedures listed *  Patient Location: Mother/Baby  Anesthesia Type:Epidural  Level of Consciousness: awake and alert   Airway and Oxygen Therapy: Patient Spontanous Breathing  Post-op Pain: mild  Post-op Assessment: Patient's Cardiovascular Status Stable, Respiratory Function Stable, No signs of Nausea or vomiting, Pain level controlled, No residual numbness and No residual motor weakness  Post-op Vital Signs: Reviewed and stable  Complications: No apparent anesthesia complications

## 2012-07-01 NOTE — Clinical Social Work Note (Signed)
Clinical Social Work Department PSYCHOSOCIAL ASSESSMENT - MATERNAL/CHILD 07/01/2012  Patient:  Amber [name]  Account Number:  [id number]  Admit Date:  06/30/2012  Childs Name:   Amber [name]    Clinical Social Worker:   , LCSW   Date/Time:  07/01/2012 09:00 AM  Date Referred:  07/01/2012   Referral source  Physician  RN     Referred reason  Substance Abuse   Other referral source:    I:  FAMILY / HOME ENVIRONMENT Child's legal guardian:  PARENT  Guardian - Name Guardian - Age Guardian - Address  Amber [name] [address] [place], [place] [address]  Amber [name]  [address] [place], [place] [address]   Other household support members/support persons Name Relationship DOB  none     Other support:   FOB reports at times his 7 yo son will stay some weekends with them.  MOB and FOB report good family support.    II  PSYCHOSOCIAL DATA Information Source:  Patient Interview  Financial and Community Resources Employment:   MOB- [employer]    FOB- [employer]   Financial resources:  Medicaid If Medicaid - County:  [place] Other  WIC   School / Grade:   Maternity Care Coordinator / Child Services Coordination / Early Interventions:  Cultural issues impacting care:   [name]/A    III  STRENGTHS Strengths  Adequate Resources  Supportive family/friends  Home prepared for Child (including basic supplies)   Strength comment:    IV  RISK FACTORS AND CURRENT PROBLEMS Current Problem:  YES   Risk Factor & Current Problem Patient Issue Family Issue Risk Factor / Current Problem Comment  Substance Abuse Y [name] postive screen early on in pregnancy   [name] [name]     V  SOCIAL WORK ASSESSMENT CSW discussed situation with RN before consulting with patient.  RN explained report of FOB being confontational and not following instructions twice since infant was born. CSW spoke with MOB and FOB in room.  CSW discussed MOB's emotional state and if  MOB ever had any MH diagnosis.  MOB reports she has not and has no current concerns.  CSW discussed postive screen for benzos in prenatal.  MOB reports taking pain medication(due to pulled muscle) which would explain opiate use during pregnancy, however she could not explain medication that could have caused benzo use.  MOB reports only taking oxycodone.  CSW discussed hx of MJ use.  MOB reports use before pregnancy and a few months in, however no current use and reports no plan to continue use.  CSW discused UDS and MOB and infant testing negative for any substances.  CSW explained about MEC screen and follow-up if any positive results are reported. CSW dsiscused any concerns with supplies and family support.  MOB and FOB reported no concerns with supplies or family support at this time. CSW discussed medicaid and any concerns.  MOB reported having WIC.  FOB is working currenlty and MOB is on maternity leave.  CSW discussed concerns with educaiton and response to staff.  CSW intructed MOB and FOB to be more attentive to educaiton and again discussed concern with infant sleeping with them and risk of SIDS.  CSW explained to MOB and FOB if they continue to be resistent of education and do not follow instructions this could be considered negleful which could involve DSS.  MOB and FOB expressed understanding.  CSW checked in with RN later in the afternoon.    RN reported no significant issues.  Please reconsult CSW if any further needs arise.  No barriers to discharge at this time.      VI SOCIAL WORK PLAN Social Work Plan  No Further Intervention Required / No Barriers to Discharge   Type of pt/family education:   If child protective services report - county:   If child protective services report - date:   Information/referral to community resources comment:   Other social work plan:    

## 2012-07-01 NOTE — Progress Notes (Signed)
Post Partum Day 1 Subjective: Eating, drinking, voiding, ambulating well.  +flatus.  Lochia and pain wnl.  No complaints.   Objective: Blood pressure 95/57, pulse 65, temperature 98 F (36.7 C), temperature source Oral, resp. rate 18, height 5\' 5"  (1.651 m), weight 68.04 kg (150 lb), last menstrual period 09/24/2011, SpO2 99.00%, unknown if currently breastfeeding.  Physical Exam:  General: alert, cooperative and no distress Lochia: appropriate Uterine Fundus: firm Incision: n/a  DVT Evaluation: No evidence of DVT seen on physical exam. Negative Homan's sign. No cords or calf tenderness. No significant calf/ankle edema.   Recent Labs  06/30/12 1110  HGB 12.7  HCT 36.8    Assessment/Plan: Plan for discharge tomorrow, Social Work consult and Contraception nuva ring, bottlefeeding   LOS: 1 day   Marge Duncans 07/01/2012, 7:10 AM

## 2012-07-02 ENCOUNTER — Encounter: Payer: Medicaid Other | Admitting: Women's Health

## 2012-07-02 MED ORDER — IBUPROFEN 600 MG PO TABS: 600.0000 mg | ORAL_TABLET | Freq: Four times a day (QID) | ORAL | Status: DC

## 2012-07-02 NOTE — MAU Provider Note (Signed)
Attestation of Attending Supervision of Advanced Practitioner (CNM/NP): Evaluation and management procedures were performed by the Advanced Practitioner under my supervision and collaboration.  I have reviewed the Advanced Practitioner's note and chart, and I agree with the management and plan.  HARRAWAY-SMITH,  12:29 PM

## 2012-07-02 NOTE — Progress Notes (Signed)
UR chart review completed.  

## 2012-07-02 NOTE — Discharge Summary (Signed)
Obstetric Discharge Summary Amber [name] is a 22 y.o. G2P1011 presenting at [redacted]w[redacted]d in active labor. She had a normal SVD and unremarkable postpartum course. Breastfeeding. Plans nuvaring for contraception.  Reason for Admission: onset of labor Prenatal Procedures: none Intrapartum Procedures: spontaneous vaginal delivery and GBS prophylaxis Postpartum Procedures: none Complications-Operative and Postpartum: 1st degree perineal laceration Hemoglobin  Date Value Range Status  06/30/2012 12.7  12.0 - 15.0 g/dL Final     HCT  Date Value Range Status  06/30/2012 36.8  36.0 - 46.0 % Final    Physical Exam:  General: alert, cooperative and no distress Lochia: appropriate Uterine Fundus: firm DVT Evaluation: no edema or calf tenderness, negative Homan's sign  Discharge Diagnoses: Term Pregnancy-delivered  Discharge Information: Date: 07/02/2012 Activity: pelvic rest Diet: routine Medications: PNV and Ibuprofen Condition: stable Instructions: refer to practice specific booklet Discharge to: home Follow-up Information   Schedule an appointment as soon as possible for a visit with [facility]. (6 weeks postpartum visit)    Contact information:   [address] [place] [address] [phone]      Newborn Data: Live born female  Birth Weight: 7 lb 1.6 oz (3220 g) APGAR: 9, 9  Home with mother.  Napoleon Form 07/02/2012, 9:41 AM

## 2012-07-04 ENCOUNTER — Telehealth: Payer: Self-pay | Admitting: Obstetrics and Gynecology

## 2012-07-04 NOTE — Telephone Encounter (Signed)
Pt states vaginal delivery on 06/30/2012, also states has a "bilateral tear on labia with stitches." Taking Motrin 800 mg for pain but not helping.   1. Requesting stronger pain medication for pain?   2. Does pt need to f/u with tear, states has a 6 week pp visit?

## 2012-07-04 NOTE — Discharge Summary (Signed)
Attestation of Attending Supervision of Advanced Practitioner (CNM/NP): Evaluation and management procedures were performed by the Advanced Practitioner under my supervision and collaboration.  I have reviewed the Advanced Practitioner's note and chart, and I agree with the management and plan.  , 07/04/2012 8:55 AM

## 2012-07-05 MED ORDER — HYDROCODONE-ACETAMINOPHEN 5-325 MG PO TABS: 1.0000 | ORAL_TABLET | Freq: Four times a day (QID) | ORAL | Status: DC | PRN

## 2012-07-05 NOTE — Telephone Encounter (Signed)
Called RX for Hydrocodone 5/325 mg to pharmacy. Pt aware and appointment made next week per Dr. Emelda Fear.

## 2012-07-10 ENCOUNTER — Encounter: Payer: Self-pay | Admitting: Women's Health

## 2012-07-10 ENCOUNTER — Ambulatory Visit (INDEPENDENT_AMBULATORY_CARE_PROVIDER_SITE_OTHER): Payer: Medicaid Other | Admitting: Women's Health

## 2012-07-10 VITALS — BP 108/84 | Ht 65.0 in | Wt 131.6 lb

## 2012-07-10 DIAGNOSIS — S01512A Laceration without foreign body of oral cavity, initial encounter: Secondary | ICD-10-CM

## 2012-07-10 DIAGNOSIS — N9089 Other specified noninflammatory disorders of vulva and perineum: Secondary | ICD-10-CM

## 2012-07-10 NOTE — Progress Notes (Signed)
Amber [name] is a 22 y.o. G52P1011 female approximately 1.5wks pp svd w/ repair of bilateral labial lacerations. She is here today w/ report of pain at top of Rt labial lac repair site x few days.  O: Small knots tied to secure suture at top of each laceration noted, these were clipped to decrease friction/irritation.  There was a loop of suture visible towards bottom of Lt labial lac, so this was also clipped.  A: Pain/irritation d/t suture of bilateral labial repair  P: To continue sitz bath, dermoplast spray as needed     Call/come back if clipping knots/suture didn't help  Marge Duncans

## 2012-07-10 NOTE — Patient Instructions (Signed)
Vaginal Laceration  You have a laceration of the vagina. A laceration is a cut or tear. Small tears usually do not need stitches. Your caregiver may have to repair the tear with stitches for you. This brings the skin edges (margins) together and allows the cut to heal faster. Your caregiver will instruct you as to whether stitch (suture) removal is necessary. Some sutures are absorbed by the body without removal. Some vaginal lacerations can be very serious because of heavy bleeding. Therefore, medical evaluation is necessary right away.  CAUSES    A controlled cutting of the vagina when delivering a baby (episiotomy).   Delivering a baby without an episiotomy.   Trauma caused by a bicycle or car accident.   Domestic violence and/or rape.  HOME CARE INSTRUCTIONS    Only take over-the-counter or prescription medicines for pain, discomfort, or fever as directed by your caregiver. Do not use aspirin because it can increase bleeding problems.   Your caregiver may prescribe medicines that kill germs (antibiotics) or recommend a tetanus shot.   You may resume normal diet and activities as directed.   Do not douche, use tampons or have intercourse until your caregiver has given permission.   A bandage (dressing) may have been applied. This may be changed once per day or as instructed. If the dressing sticks, it may be soaked off with soapy water or hydrogen peroxide.   Warm sitz baths 2 to 3 times a day may help any discomfort and swelling of the laceration.  You might need a tetanus shot now if:   You have no idea when you had the last one.   You have never had a tetanus shot before.   Your cut had dirt in it.  If you need a tetanus shot, and you decide not to get one, there is a rare chance of getting tetanus. Sickness from tetanus can be serious. If you got a tetanus shot, your arm may swell, get red and warm to the touch at the shot site. This is common and not a problem.  SEEK MEDICAL CARE IF:    You  develop a rash.   You have problems with the medications.  SEEK IMMEDIATE MEDICAL CARE IF:    There is redness, swelling, or increasing pain in the vaginal area.   Pus is coming from the wound or vagina.   An unexplained oral temperature above 102 F (38.9 C) develops.   You notice a bad smell coming from the vagina. This may be a sign of infection.   There is a breaking open of a wound (edges not staying together).   You develop increasing belly (abdominal) pain.   There is a lot of (excessive) vaginal bleeding.   There is pain with intercourse after the laceration heals. Scar tissue may have developed.  Document Released: 02/21/2005 Document Revised: 05/16/2011 Document Reviewed: 07/11/2011  ExitCare Patient Information 2013 [employer].

## 2012-08-13 ENCOUNTER — Ambulatory Visit (INDEPENDENT_AMBULATORY_CARE_PROVIDER_SITE_OTHER): Payer: Medicaid Other | Admitting: Women's Health

## 2012-08-13 ENCOUNTER — Encounter: Payer: Self-pay | Admitting: Women's Health

## 2012-08-13 VITALS — BP 100/70 | Ht 65.0 in | Wt 129.0 lb

## 2012-08-13 DIAGNOSIS — Z3480 Encounter for supervision of other normal pregnancy, unspecified trimester: Secondary | ICD-10-CM

## 2012-08-13 NOTE — Patient Instructions (Signed)

## 2012-08-13 NOTE — Progress Notes (Signed)
Patient ID: Amber [name], female   DOB: 10-04-1990, 22 y.o.   MRN: [id number] Subjective:    Amber [name] is a 22 y.o. G52P1011 Caucasian female who presents for a postpartum visit. She is 6 weeks postpartum following a spontaneous vaginal delivery. I have fully reviewed the prenatal and intrapartum course. The delivery was at 40 gestational weeks. Outcome: spontaneous vaginal delivery. Anesthesia: epidural. Postpartum course has been uncomplicated. Baby's course has been uncomplicated. Baby is feeding by bottle. Bleeding no bleeding. Bowel function is normal. Bladder function is normal. Patient is sexually active, last intercourse on Friday. Contraception method is condoms. Postpartum depression screening: negative. Desires nexplanon for contraception- reviewed r/b, wishes to proceed. Counseled pt & partner on no sex until after nexplanon placed. The following portions of the patient's history were reviewed and updated as appropriate: allergies, current medications, past medical history, past surgical history and problem list.  Review of Systems Pertinent items are noted in HPI.   Filed Vitals:   08/13/12 1123  BP: 100/70  Height: 5\' 5"  (1.651 m)  Weight: 129 lb (58.514 kg)    Objective:     General:  alert, cooperative and no distress   Breasts:  deferred, no complaints  Lungs: clear to auscultation bilaterally  Heart:  regular rate and rhythm  Abdomen: soft, nontender   Vulva: normal  Vagina: normal vagina  Cervix:  closed  Corpus: Well-involuted  Adnexa:  Non-palpable  Rectal Exam: No hemorrhoids        Assessment:   Normal postpartum exam 6 wks s/p SVD Depression screening Contraception counseling   Plan:   Contraception: desires Nexplanon No sex until after nexplanon placed Follow up on: 6/17  for bHCG am, then nexplanon placement pm  Marge Duncans  08/13/12 @ 12:05 PM

## 2012-08-21 ENCOUNTER — Other Ambulatory Visit: Payer: Medicaid Other

## 2012-08-21 ENCOUNTER — Ambulatory Visit (INDEPENDENT_AMBULATORY_CARE_PROVIDER_SITE_OTHER): Payer: Medicaid Other | Admitting: Women's Health

## 2012-08-21 ENCOUNTER — Encounter: Payer: Self-pay | Admitting: Women's Health

## 2012-08-21 VITALS — BP 118/74 | Ht 65.0 in | Wt 128.6 lb

## 2012-08-21 DIAGNOSIS — Z3046 Encounter for surveillance of implantable subdermal contraceptive: Secondary | ICD-10-CM

## 2012-08-21 DIAGNOSIS — Z3200 Encounter for pregnancy test, result unknown: Secondary | ICD-10-CM

## 2012-08-21 DIAGNOSIS — Z30017 Encounter for initial prescription of implantable subdermal contraceptive: Secondary | ICD-10-CM

## 2012-08-21 NOTE — Patient Instructions (Addendum)

## 2012-08-21 NOTE — Progress Notes (Addendum)
Amber [name] is a 22 y.o. year old Caucasian female here for Nexplanon insertion.  Her LMP was 6/11- she is still menstruating, and her bHCG this am was negative <1.  Risks/benefits/side effects of Nexplanon have been discussed and her questions have been answered.  Specifically, a failure rate of 03/998 has been reported, with an increased failure rate if pt takes St. John's Wort and/or antiseizure medicaitons.  Amber [name] is aware of the common side effect of irregular bleeding, which the incidence of decreases over time.  Her left arm, approximatly 8-10cm proximal from the elbow, was cleansed with betadine and anesthetized with 2cc of 2% Lidocaine.  The area was cleansed again and the Nexplanon was inserted without difficulty.  A steri-strip and pressure bandage were applied.  Pt was instructed to remove pressure bandage in 24 hours and steri-strip in 3-5 days.  Back up contraception was recommended for 2wks.  Follow-up scheduled PRN problems  Lot# [id number]   Exp: 10/2014  Marge Duncans 08/21/2012 2:54 PM

## 2012-10-15 ENCOUNTER — Emergency Department (HOSPITAL_COMMUNITY): Payer: Self-pay

## 2012-10-15 ENCOUNTER — Emergency Department (HOSPITAL_COMMUNITY)
Admission: EM | Admit: 2012-10-15 | Discharge: 2012-10-15 | Disposition: A | Discharge status: Home / Self Care | Payer: Self-pay | Attending: Emergency Medicine | Admitting: Emergency Medicine

## 2012-10-15 DIAGNOSIS — R51 Headache: Secondary | ICD-10-CM | POA: Insufficient documentation

## 2012-10-15 DIAGNOSIS — Z881 Allergy status to other antibiotic agents status: Secondary | ICD-10-CM | POA: Insufficient documentation

## 2012-10-15 DIAGNOSIS — N3000 Acute cystitis without hematuria: Secondary | ICD-10-CM | POA: Insufficient documentation

## 2012-10-15 DIAGNOSIS — Z87891 Personal history of nicotine dependence: Secondary | ICD-10-CM | POA: Insufficient documentation

## 2012-10-15 DIAGNOSIS — N3010 Interstitial cystitis (chronic) without hematuria: Secondary | ICD-10-CM

## 2012-10-15 DIAGNOSIS — R1031 Right lower quadrant pain: Secondary | ICD-10-CM | POA: Insufficient documentation

## 2012-10-15 DIAGNOSIS — Z3202 Encounter for pregnancy test, result negative: Secondary | ICD-10-CM | POA: Insufficient documentation

## 2012-10-15 DIAGNOSIS — R112 Nausea with vomiting, unspecified: Secondary | ICD-10-CM | POA: Insufficient documentation

## 2012-10-15 DIAGNOSIS — R109 Unspecified abdominal pain: Secondary | ICD-10-CM

## 2012-10-15 DIAGNOSIS — Z8742 Personal history of other diseases of the female genital tract: Secondary | ICD-10-CM | POA: Insufficient documentation

## 2012-10-15 LAB — BASIC METABOLIC PANEL
CO2: 24 mEq/L (ref 19–32)
Calcium: 9.3 mg/dL (ref 8.4–10.5)
GFR calc non Af Amer: >90 mL/min (ref >90)
Sodium: 141 mEq/L (ref 135–145)

## 2012-10-15 LAB — URINALYSIS, DIPSTICK ONLY
Glucose, UA: NEGATIVE mg/dL
Protein, ur: NEGATIVE mg/dL
Specific Gravity, Urine: 1.007 (ref 1.005–1.030)
pH: 7.5 (ref 5.0–8.0)

## 2012-10-15 LAB — POCT PREGNANCY, URINE: Preg Test, Ur: NEGATIVE

## 2012-10-15 MED ORDER — MORPHINE SULFATE 4 MG/ML IJ SOLN
4.0000 mg | Freq: Once | INTRAMUSCULAR | Status: AC
  Administered 2012-10-15: 4 mg via INTRAVENOUS
  Filled 2012-10-15: qty 1

## 2012-10-15 MED ORDER — KETOROLAC TROMETHAMINE 30 MG/ML IJ SOLN
30.0000 mg | Freq: Once | INTRAMUSCULAR | Status: AC
  Administered 2012-10-15: 30 mg via INTRAVENOUS
  Filled 2012-10-15: qty 1

## 2012-10-15 MED ORDER — HYDROCODONE-ACETAMINOPHEN 5-325 MG PO TABS: 1.0000 | ORAL_TABLET | ORAL | Status: DC | PRN

## 2012-10-15 MED ORDER — ONDANSETRON 4 MG PO TBDP
4.0000 mg | ORAL_TABLET | Freq: Once | ORAL | Status: AC
  Administered 2012-10-15: 4 mg via ORAL
  Filled 2012-10-15: qty 1

## 2012-10-15 NOTE — ED Provider Notes (Signed)
CSN: [id number]     Arrival date & time 10/15/12  4098 History     First MD Initiated Contact with Patient 10/15/12 [phone]     Chief Complaint  Patient presents with  . Abdominal Pain   (Consider location/radiation/quality/duration/timing/severity/associated sxs/prior Treatment) Patient is a 22 y.o. female presenting with abdominal pain. The history is provided by the patient.  Abdominal Pain Pain location:  RLQ Pain quality: aching   Pain radiates to:  Does not radiate Pain severity:  Moderate Onset quality:  Gradual Duration:  5 days Timing:  Intermittent Progression:  Worsening Context: not eating and not recent sexual activity   Relieved by:  NSAIDs Worsened by:  Palpation Associated symptoms: nausea and vomiting   Associated symptoms: no chest pain, no constipation, no diarrhea, no dysuria, no fever, no shortness of breath and no vaginal discharge    The patient is approximately 3 months postpartum and presents with a one-week history of worsening right lower quadrant pain. She describes the pain as achy. She states it has been going on for 4-5 days. Last 24 hours as worsened. She's taken 2 doses of ibuprofen and one dose of Tylenol which "helps some." The patient currently rates her pain at 7/10. She had one episode of nonbilious, nonbloody emesis this morning. Patient states that she's currently on her menstrual cycle. She received an implanon at her postpartum visit. She states that she has been having more frequent cycles and headaches since the Implanon was implanted. Patient denies any back pain or urinary symptoms. She denies any fevers. The patient has a history of interstitial cystitis but has not been treated in over 10 months because of her pregnancy and the cost of treatments. She states that this is kind of like her interstitial cystitis except for it is localized more in the right lower quadrant.  Past Medical History  Diagnosis Date  . Interstitial cystitis   .  Protein in urine   . Abnormal Pap smear   . Ovarian cyst   . IC (interstitial cystitis) 04/17/2012  . GBS (group B streptococcus) UTI complicating pregnancy 04/17/2012   Past Surgical History  Procedure Laterality Date  . Dilation and curettage of uterus     Family History  Problem Relation Age of Onset  . Cancer Maternal Uncle   . Other Neg Hx   . Diabetes Maternal Grandmother   . Cancer Maternal Grandmother     colon  . Diabetes Paternal Grandmother   . Cancer Paternal Grandmother     bone   History  Substance Use Topics  . Smoking status: Former Smoker -- 1.00 packs/day    Types: Cigarettes    Quit date: 12/30/2011  . Smokeless tobacco: Never Used  . Alcohol Use: No     Comment: occ   OB History   Grav Para Term Preterm Abortions TAB SAB Ect Mult Living   2 1 1  1  1   1      Review of Systems  Constitutional: Negative for fever.  Respiratory: Negative for shortness of breath.   Cardiovascular: Negative for chest pain.  Gastrointestinal: Positive for nausea, vomiting and abdominal pain. Negative for diarrhea, constipation and abdominal distention.  Genitourinary: Negative for dysuria, flank pain, vaginal discharge and difficulty urinating.  Skin: Negative for rash.  Neurological: Positive for headaches.  All other systems reviewed and are negative.    Allergies  Clarithromycin  Home Medications   Current Outpatient Rx  Name  Route  Sig  Dispense  Refill  . ibuprofen (ADVIL,MOTRIN) 800 MG tablet   Oral   Take 800 mg by mouth every 8 (eight) hours as needed for pain.         Marland Kitchen HYDROcodone-acetaminophen (NORCO/VICODIN) 5-325 MG per tablet   Oral   Take 1 tablet by mouth every 4 (four) hours as needed for pain.   10 tablet   0    BP 107/51  Pulse 74  Temp(Src) 98.3 F (36.8 C) (Oral)  Resp 16  SpO2 98%  LMP 08/15/2012 Physical Exam  Constitutional: She is oriented to person, place, and time. She appears well-developed and well-nourished. No  distress.  HENT:  Head: Normocephalic and atraumatic.  Eyes: Pupils are equal, round, and reactive to light.  Cardiovascular: Normal rate, regular rhythm and normal heart sounds.   Pulmonary/Chest: Effort normal and breath sounds normal.  Abdominal: Soft. Bowel sounds are normal. She exhibits no distension and no mass. There is tenderness. There is no rigidity, no rebound, no guarding, no CVA tenderness and negative Murphy's sign.  Mild tenderness to palpation of the right lower quadrant  Genitourinary: Uterus normal. Pelvic exam was performed with patient supine. Cervix exhibits no motion tenderness, no discharge and no friability. Right adnexum displays tenderness. Right adnexum displays no fullness. Left adnexum displays no tenderness.  Scant bloody discharge from cervical os.  Neurological: She is alert and oriented to person, place, and time.  Skin: Skin is warm and dry.  Psychiatric: She has a normal mood and affect.    ED Course   Procedures (including critical care time)  Labs Reviewed  URINALYSIS, DIPSTICK ONLY - Abnormal; Notable for the following:    Hgb urine dipstick LARGE (*)    Leukocytes, UA SMALL (*)    All other components within normal limits  BASIC METABOLIC PANEL - Abnormal; Notable for the following:    Potassium 3.4 (*)    Glucose, Bld 100 (*)    All other components within normal limits  GC/CHLAMYDIA PROBE AMP  URINE CULTURE  POCT PREGNANCY, URINE   US Transvaginal Non-ob  10/15/2012   *RADIOLOGY REPORT*  Clinical Data: Abdominal pain; evaluate for right ovarian cyst  TRANSABDOMINAL AND TRANSVAGINAL ULTRASOUND OF PELVIS Technique:  Both transabdominal and transvaginal ultrasound examinations of the pelvis were performed. Transabdominal technique was performed for global imaging of the pelvis including uterus, ovaries, adnexal regions, and pelvic cul-de-sac.  It was necessary to proceed with endovaginal exam following the transabdominal exam to visualize the  bilateral ovaries.  Comparison:  Ultrasound 11/21/2011  Findings:  Uterus: Normal in size and appearance measuring 7.5 x 3.3 x 4.7 cm  Endometrium: Normal in thickness and appearance.  AP thickness measures 0.4 cm.  Right ovary:  The right ovary measures 3.4 x 1.6 x 1.8 cm.  There is a 2.4 x 1.3 x 1.4 cm dominant follicle.  Left ovary: Not visualized  Other findings: No free fluid  IMPRESSION:  1. Dominant follicle within the right ovary.  2.  The left ovary is not visualized.   Original Report Authenticated By: Annia Belt, M.D   US Pelvis Complete  10/15/2012   *RADIOLOGY REPORT*  Clinical Data: Abdominal pain; evaluate for right ovarian cyst  TRANSABDOMINAL AND TRANSVAGINAL ULTRASOUND OF PELVIS Technique:  Both transabdominal and transvaginal ultrasound examinations of the pelvis were performed. Transabdominal technique was performed for global imaging of the pelvis including uterus, ovaries, adnexal regions, and pelvic cul-de-sac.  It was necessary to proceed with endovaginal exam following the transabdominal exam to  visualize the bilateral ovaries.  Comparison:  Ultrasound 11/21/2011  Findings:  Uterus: Normal in size and appearance measuring 7.5 x 3.3 x 4.7 cm  Endometrium: Normal in thickness and appearance.  AP thickness measures 0.4 cm.  Right ovary:  The right ovary measures 3.4 x 1.6 x 1.8 cm.  There is a 2.4 x 1.3 x 1.4 cm dominant follicle.  Left ovary: Not visualized  Other findings: No free fluid  IMPRESSION:  1. Dominant follicle within the right ovary.  2.  The left ovary is not visualized.   Original Report Authenticated By: Annia Belt, M.D   1. IC (interstitial cystitis)   2. Abdominal pain     MDM  This is a 64 female who is 3 months postpartum who presents with gradually worsening abdominal pain. The patient is nontoxic-appearing on exam and her vital signs were initially notable for a heart rate of 104. Patient had mild tenderness to palpation of the right lower quadrant without  rebound or guarding. Differential for her pain includes appendicitis, ovarian cyst, interstitial cystitis, UTIs, IC. Lab work was notable for a urine dipstick with positive hemoglobin and trace leuks.  Hemoglobin likely due the patient's current period. There were no nitrites. This was sent for culture. Otherwise lab work was notable for a potassium of 3.4 which is at the patient's baseline. Transvaginal ultrasound was ordered to evaluate  for ovarian cyst versus torsion. Ultrasound is normal.  She has a right dominant follicle. The patient's pain was improved with Toradol and morphine. Upon reexamination, the patient had a benign abdominal exam. Her heart rate was 60.  Given the patient's negative workup and benign physical exam, I spoke with the patient regarding her abdominal pain and need to followup with her OB regarding her interstitial cystitis. Patient was given return precautions including increasing pain, nausea, vomiting, or fever. The patient stated understanding. She will followup with her OB. Patient was given a short course of Lortab and was instructed not to drive or operate any machinery under the influence of Lortab. She is also to continue her ibuprofen restart her potassium supplementation.  Shon Baton, MD 10/15/12 1001

## 2012-10-15 NOTE — ED Notes (Addendum)
Pt c/o RLQ pain since yesterday which markedly increased at 3 am.  Emesis x 2 since then.  Pt states she rcvd Implamon birth control over a month ago and since then she has been having increased bleeding and headaches.  Presently menstruating and feels the RLQ pain is similar to cramping and her interstitial cystitis (which she has not had tx for in 10 months).

## 2012-10-16 LAB — GC/CHLAMYDIA PROBE AMP: CT Probe RNA: NEGATIVE

## 2012-10-16 LAB — URINE CULTURE: Colony Count: NO GROWTH

## 2012-12-29 ENCOUNTER — Encounter (HOSPITAL_COMMUNITY): Payer: Self-pay | Admitting: Emergency Medicine

## 2012-12-29 ENCOUNTER — Emergency Department (HOSPITAL_COMMUNITY)
Admission: EM | Admit: 2012-12-29 | Discharge: 2012-12-29 | Disposition: A | Discharge status: Home / Self Care | Payer: Medicaid Other | Attending: Emergency Medicine | Admitting: Emergency Medicine

## 2012-12-29 DIAGNOSIS — Z8619 Personal history of other infectious and parasitic diseases: Secondary | ICD-10-CM | POA: Insufficient documentation

## 2012-12-29 DIAGNOSIS — Z87440 Personal history of urinary (tract) infections: Secondary | ICD-10-CM | POA: Insufficient documentation

## 2012-12-29 DIAGNOSIS — Z8719 Personal history of other diseases of the digestive system: Secondary | ICD-10-CM | POA: Insufficient documentation

## 2012-12-29 DIAGNOSIS — K047 Periapical abscess without sinus: Secondary | ICD-10-CM

## 2012-12-29 DIAGNOSIS — K0889 Other specified disorders of teeth and supporting structures: Secondary | ICD-10-CM

## 2012-12-29 DIAGNOSIS — F17200 Nicotine dependence, unspecified, uncomplicated: Secondary | ICD-10-CM | POA: Insufficient documentation

## 2012-12-29 DIAGNOSIS — Z8742 Personal history of other diseases of the female genital tract: Secondary | ICD-10-CM | POA: Insufficient documentation

## 2012-12-29 DIAGNOSIS — R220 Localized swelling, mass and lump, head: Secondary | ICD-10-CM | POA: Insufficient documentation

## 2012-12-29 MED ORDER — PENICILLIN V POTASSIUM 250 MG PO TABS
500.0000 mg | ORAL_TABLET | Freq: Once | ORAL | Status: AC
  Administered 2012-12-29: 500 mg via ORAL
  Filled 2012-12-29: qty 2

## 2012-12-29 MED ORDER — IBUPROFEN 800 MG PO TABS: 800.0000 mg | ORAL_TABLET | Freq: Three times a day (TID) | ORAL | Status: DC | PRN

## 2012-12-29 MED ORDER — PENICILLIN V POTASSIUM 500 MG PO TABS: 500.0000 mg | ORAL_TABLET | Freq: Four times a day (QID) | ORAL | Status: AC

## 2012-12-29 MED ORDER — HYDROCODONE-ACETAMINOPHEN 5-325 MG PO TABS: 1.0000 | ORAL_TABLET | ORAL | Status: DC | PRN

## 2012-12-29 NOTE — ED Notes (Signed)
C/o R upper dental pain since Thursday night and swelling to R side of face.  Reports filling fell out a couple days ago.

## 2012-12-29 NOTE — ED Provider Notes (Signed)
Medical screening examination/treatment/procedure(s) were performed by non-physician practitioner and as supervising physician I was immediately available for consultation/collaboration.    Sunnie Nielsen, MD 12/29/12 [phone]

## 2012-12-29 NOTE — ED Provider Notes (Signed)
CSN: [id number]     Arrival date & time 12/29/12  0119 History   First MD Initiated Contact with Patient 12/29/12 0134     Chief Complaint  Patient presents with  . Dental Pain   HPI  History provided by the patient. Patient is a 22 year old female who presents with complaints of right upper dental pain and facial swelling. Patient states that a cavity of her tooth broke and fell of 2-3 days ago. She then began having some pain followed by swelling of her face. Patient was using ibuprofen and also used some hydrocodone from her mother did help some with the pain however if swelling has not improved. She denies any associated fever, chills or sweats. Denies any difficulty breathing or swallowing. No other aggravating or alleviating factors. No other associated symptoms.    Past Medical History  Diagnosis Date  . Interstitial cystitis   . Protein in urine   . Abnormal Pap smear   . Ovarian cyst   . IC (interstitial cystitis) 04/17/2012  . GBS (group B streptococcus) UTI complicating pregnancy 04/17/2012   Past Surgical History  Procedure Laterality Date  . Dilation and curettage of uterus     Family History  Problem Relation Age of Onset  . Cancer Maternal Uncle   . Other Neg Hx   . Diabetes Maternal Grandmother   . Cancer Maternal Grandmother     colon  . Diabetes Paternal Grandmother   . Cancer Paternal Grandmother     bone   History  Substance Use Topics  . Smoking status: Current Every Day Smoker -- 1.00 packs/day    Types: Cigarettes  . Smokeless tobacco: Never Used  . Alcohol Use: No     Comment: occ   OB History   Grav Para Term Preterm Abortions TAB SAB Ect Mult Living   2 1 1  1  1   1      Review of Systems  Constitutional: Negative for fever, chills and diaphoresis.  HENT: Positive for dental problem and facial swelling. Negative for drooling and trouble swallowing.   All other systems reviewed and are negative.    Allergies  Clarithromycin  Home  Medications   Current Outpatient Rx  Name  Route  Sig  Dispense  Refill  . HYDROcodone-acetaminophen (NORCO/VICODIN) 5-325 MG per tablet   Oral   Take 1 tablet by mouth every 4 (four) hours as needed for pain.   10 tablet   0   . ibuprofen (ADVIL,MOTRIN) 800 MG tablet   Oral   Take 800 mg by mouth every 8 (eight) hours as needed for pain.          BP 118/76  Pulse 71  Temp(Src) 98.5 F (36.9 C) (Oral)  Resp 17  Wt 123 lb 3 oz (55.877 kg)  BMI 20.5 kg/m2  SpO2 100%  LMP 12/25/2012 Physical Exam  Nursing note and vitals reviewed. Constitutional: She is oriented to person, place, and time. She appears well-developed and well-nourished. No distress.  HENT:  Head: Normocephalic.  Mouth/Throat:    Partial fracture to the outer right upper first premolar. There is mild adjacent swelling to the gums without any fluctuant abscess.  Swelling and asymmetry to the right face.  Eyes: Conjunctivae and EOM are normal. Pupils are equal, round, and reactive to light.  Neck: Normal range of motion. Neck supple.  Cardiovascular: Normal rate and regular rhythm.   Pulmonary/Chest: Effort normal and breath sounds normal. No respiratory distress. She has no wheezes.  She has no rales.  Abdominal: Soft.  Lymphadenopathy:    She has no cervical adenopathy.  Neurological: She is alert and oriented to person, place, and time.  Skin: Skin is warm and dry. No rash noted.  Psychiatric: She has a normal mood and affect. Her behavior is normal.    ED Course  Procedures   DIAGNOSTIC STUDIES: Oxygen Saturation is 100% on room air.    COORDINATION OF CARE:  Nursing notes reviewed. Vital signs reviewed. Initial pt interview and examination performed.   1:39 AM-Discussed treatment plan with pt at bedside, which includes dental block, PCN and dental referral. Pt agrees with plan.   Dental Block Performed by: Angus Seller Authorized by: Angus Seller Consent: Verbal consent  obtained. Risks and benefits: risks, benefits and alternatives were discussed Consent given by: patient Patient identity confirmed: provided demographic data  Location: right upper first premolar  Local anesthetic: Bupivacaine 0.5% with epinephrine  Anesthetic total: 1.8 ml  Irrigation method: syringe  Patient tolerance: Patient tolerated the procedure well with no immediate complications. Pain improved.   MDM   1. Dental abscess   2. Pain, dental        Angus Seller, PA-C 12/29/12 0159

## 2012-12-29 NOTE — ED Notes (Signed)
Theron Arista, PA at bedside with dental box.

## 2012-12-30 ENCOUNTER — Emergency Department (HOSPITAL_COMMUNITY)
Admission: EM | Admit: 2012-12-30 | Discharge: 2012-12-30 | Disposition: A | Discharge status: Home / Self Care | Payer: Medicaid Other | Attending: Emergency Medicine | Admitting: Emergency Medicine

## 2012-12-30 ENCOUNTER — Encounter (HOSPITAL_COMMUNITY): Payer: Self-pay | Admitting: Emergency Medicine

## 2012-12-30 DIAGNOSIS — F17200 Nicotine dependence, unspecified, uncomplicated: Secondary | ICD-10-CM | POA: Insufficient documentation

## 2012-12-30 DIAGNOSIS — K0381 Cracked tooth: Secondary | ICD-10-CM | POA: Insufficient documentation

## 2012-12-30 DIAGNOSIS — Z87448 Personal history of other diseases of urinary system: Secondary | ICD-10-CM | POA: Insufficient documentation

## 2012-12-30 DIAGNOSIS — Z881 Allergy status to other antibiotic agents status: Secondary | ICD-10-CM | POA: Insufficient documentation

## 2012-12-30 DIAGNOSIS — Z8742 Personal history of other diseases of the female genital tract: Secondary | ICD-10-CM | POA: Insufficient documentation

## 2012-12-30 DIAGNOSIS — K047 Periapical abscess without sinus: Secondary | ICD-10-CM | POA: Insufficient documentation

## 2012-12-30 MED ORDER — OXYCODONE-ACETAMINOPHEN 5-325 MG PO TABS
2.0000 | ORAL_TABLET | Freq: Once | ORAL | Status: AC
  Administered 2012-12-30: 2 via ORAL
  Filled 2012-12-30: qty 2

## 2012-12-30 NOTE — ED Notes (Signed)
Pt seen here recently for Dental pain. Per Pain not improved with treatment and swelling worse.

## 2012-12-30 NOTE — ED Provider Notes (Signed)
CSN: [id number]     Arrival date & time 12/30/12  0243 History   First MD Initiated Contact with Patient 12/30/12 0434     Chief Complaint  Patient presents with  . Dental Pain   HPI  History provided by the patient and recent medical chart. Patient is a 22 year old female who returns with continued right upper dental pain. Patient was seen by myself 24 hours ago for similar complaints. She states she has been taking prescribed antibiotics as well as pain medications as instructed. She states she continues to have recurrent pain every 2-3 hours to the area despite pain medications. Pain has caused difficulty sleeping. She denies any worsening swelling but does believe she may have some draining pus from the area. She denies any fever, chills or sweats. Her last dose of hydrocodone was at 10 PM. She has not taken any additional medicines prior to arrival. No other changes in symptoms.    Past Medical History  Diagnosis Date  . Interstitial cystitis   . Protein in urine   . Abnormal Pap smear   . Ovarian cyst   . IC (interstitial cystitis) 04/17/2012  . GBS (group B streptococcus) UTI complicating pregnancy 04/17/2012   Past Surgical History  Procedure Laterality Date  . Dilation and curettage of uterus     Family History  Problem Relation Age of Onset  . Cancer Maternal Uncle   . Other Neg Hx   . Diabetes Maternal Grandmother   . Cancer Maternal Grandmother     colon  . Diabetes Paternal Grandmother   . Cancer Paternal Grandmother     bone   History  Substance Use Topics  . Smoking status: Current Every Day Smoker -- 1.00 packs/day    Types: Cigarettes  . Smokeless tobacco: Never Used  . Alcohol Use: No     Comment: occ   OB History   Grav Para Term Preterm Abortions TAB SAB Ect Mult Living   2 1 1  1  1   1      Review of Systems  Constitutional: Negative for fever, chills and diaphoresis.  HENT: Positive for dental problem. Negative for sore throat and trouble  swallowing.   Gastrointestinal: Negative for nausea and vomiting.  All other systems reviewed and are negative.    Allergies  Clarithromycin  Home Medications   Current Outpatient Rx  Name  Route  Sig  Dispense  Refill  . HYDROcodone-acetaminophen (NORCO/VICODIN) 5-325 MG per tablet   Oral   Take 1 tablet by mouth every 4 (four) hours as needed for pain.   10 tablet   0   . HYDROcodone-acetaminophen (NORCO/VICODIN) 5-325 MG per tablet   Oral   Take 1-2 tablets by mouth every 4 (four) hours as needed for pain.   20 tablet   0   . ibuprofen (ADVIL,MOTRIN) 800 MG tablet   Oral   Take 800 mg by mouth every 8 (eight) hours as needed for pain.         Marland Kitchen ibuprofen (ADVIL,MOTRIN) 800 MG tablet   Oral   Take 1 tablet (800 mg total) by mouth every 8 (eight) hours as needed for pain.   30 tablet   0   . penicillin v potassium (VEETID) 500 MG tablet   Oral   Take 1 tablet (500 mg total) by mouth 4 (four) times daily.   40 tablet   0    BP 120/87  Pulse 73  Temp(Src) 98.7 F (37.1 C) (Oral)  Resp 18  SpO2 98%  LMP 12/25/2012 Physical Exam  Nursing note and vitals reviewed. Constitutional: She is oriented to person, place, and time. She appears well-developed and well-nourished. No distress.  HENT:  Head: Normocephalic.  Mouth/Throat:    Partial fracture to the outer right upper first premolar. There is mild adjacent swelling to the gums without any fluctuant abscess. Swelling appears improved from prior visit one day ago.    Eyes: Conjunctivae and EOM are normal. Pupils are equal, round, and reactive to light.  Neck: Normal range of motion. Neck supple.  Cardiovascular: Normal rate and regular rhythm.   Pulmonary/Chest: Effort normal and breath sounds normal. No respiratory distress. She has no wheezes. She has no rales.  Abdominal: Soft.  Lymphadenopathy:    She has no cervical adenopathy.  Neurological: She is alert and oriented to person, place, and time.    Skin: Skin is warm and dry. No rash noted.  Psychiatric: She has a normal mood and affect. Her behavior is normal.    ED Course  Procedures   Patient seen and evaluated. It she appears improved from her examination 24 hours ago. She is taking the antibiotics as prescribed. She has not had any nausea or vomiting. Will give one dose of Percocet medicine here and patient instructed to continue her home medications in 4 hours. She may followup with the dentist as planned.    MDM   1. Dental abscess       Angus Seller, PA-C 12/30/12 [phone]

## 2012-12-30 NOTE — ED Notes (Signed)
Pt states she pressed on it an pus came out.

## 2012-12-30 NOTE — ED Notes (Signed)
rec'd pt room 18.  C/O of pain right upper tooth that has broken off to the gum line.  Reports was seen here 24 hrs ago, given meds.  Pain increasing, swelling increasing.  "nothing is helping"

## 2013-01-08 NOTE — ED Provider Notes (Signed)
Medical screening examination/treatment/procedure(s) were performed by non-physician practitioner and as supervising physician I was immediately available for consultation/collaboration.    , MD 01/08/13 0043 

## 2013-01-10 ENCOUNTER — Other Ambulatory Visit: Payer: Self-pay

## 2013-02-22 ENCOUNTER — Inpatient Hospital Stay (HOSPITAL_COMMUNITY)
Admission: AD | Admit: 2013-02-22 | Discharge: 2013-02-22 | Disposition: A | Discharge status: Home / Self Care | Payer: Self-pay | Source: Ambulatory Visit | Attending: Obstetrics & Gynecology | Admitting: Obstetrics & Gynecology

## 2013-02-22 ENCOUNTER — Inpatient Hospital Stay (HOSPITAL_COMMUNITY): Payer: Self-pay

## 2013-02-22 ENCOUNTER — Encounter (HOSPITAL_COMMUNITY): Payer: Self-pay | Admitting: *Deleted

## 2013-02-22 DIAGNOSIS — N3010 Interstitial cystitis (chronic) without hematuria: Secondary | ICD-10-CM

## 2013-02-22 DIAGNOSIS — O99330 Smoking (tobacco) complicating pregnancy, unspecified trimester: Secondary | ICD-10-CM | POA: Insufficient documentation

## 2013-02-22 DIAGNOSIS — K5900 Constipation, unspecified: Secondary | ICD-10-CM

## 2013-02-22 DIAGNOSIS — R109 Unspecified abdominal pain: Secondary | ICD-10-CM | POA: Insufficient documentation

## 2013-02-22 DIAGNOSIS — O2390 Unspecified genitourinary tract infection in pregnancy, unspecified trimester: Secondary | ICD-10-CM | POA: Insufficient documentation

## 2013-02-22 LAB — CBC
Hemoglobin: 12.3 g/dL (ref 12.0–15.0)
RBC: 3.77 MIL/uL — ABNORMAL LOW (ref 3.87–5.11)
WBC: 6.3 10*3/uL (ref 4.0–10.5)

## 2013-02-22 LAB — URINALYSIS, ROUTINE W REFLEX MICROSCOPIC
Leukocytes, UA: NEGATIVE
Nitrite: NEGATIVE
Specific Gravity, Urine: 1.02 (ref 1.005–1.030)
pH: 6.5 (ref 5.0–8.0)

## 2013-02-22 LAB — WET PREP, GENITAL: Trich, Wet Prep: NONE SEEN

## 2013-02-22 MED ORDER — IBUPROFEN 800 MG PO TABS: 800.0000 mg | ORAL_TABLET | Freq: Three times a day (TID) | ORAL | Status: DC | PRN

## 2013-02-22 MED ORDER — KETOROLAC TROMETHAMINE 60 MG/2ML IM SOLN
60.0000 mg | Freq: Once | INTRAMUSCULAR | Status: AC
  Administered 2013-02-22: 60 mg via INTRAMUSCULAR
  Filled 2013-02-22: qty 2

## 2013-02-22 MED ORDER — OXYCODONE-ACETAMINOPHEN 5-325 MG PO TABS
2.0000 | ORAL_TABLET | Freq: Once | ORAL | Status: AC
  Administered 2013-02-22: 2 via ORAL
  Filled 2013-02-22: qty 2

## 2013-02-22 MED ORDER — OXYCODONE-ACETAMINOPHEN 5-325 MG PO TABS: 1.0000 | ORAL_TABLET | ORAL | Status: DC | PRN

## 2013-02-22 MED ORDER — PHENAZOPYRIDINE HCL 100 MG PO TABS
200.0000 mg | ORAL_TABLET | Freq: Once | ORAL | Status: DC
  Filled 2013-02-22: qty 2

## 2013-02-22 MED ORDER — POLYETHYLENE GLYCOL 3350 17 G PO PACK: 17.0000 g | PACK | Freq: Every day | ORAL | Status: DC

## 2013-02-22 NOTE — MAU Note (Signed)
PT  SAYS LMP WAS 11-27-  HEAVY BLEEDING- BUT NO BLEEDING  NOW - STOPPED  ON 12-17.  CRAMPING IN LOWER ABD  PAIN- WITH SHARP PAIN IN LEFT SIDE- ALL STARTED ON WED.     SHE TOOK AT 8 PM - 2 HYDROCODONE-  NO RELIEF.   THEN  TRAMADOL  1  TAB  AT 2300.     AND 800MG  IBUPROFEN  AT 2300- NO RELIEF.    SAYS SHE HAS INPLANON IN LEFT ARM-  INSERTED IN MAY.     HAD BABY ON 06-30-2012  WITH [employer].  DOES NOT HAVE INSURANCE-  BUT SHE GOT JOB- AND IS WAITING ON INSURANCE.      SAYS SINCE  HAD BABY - BLEEDING   AND PAIN WORSE.    LAST SEX- LAST Friday-  USES  CONDOMS.

## 2013-02-22 NOTE — MAU Provider Note (Signed)
Chief Complaint: No chief complaint on file.  First Provider Initiated Contact with Patient 02/22/13 0231     SUBJECTIVE HPI: Amber [name] is a 22 y.o. G4P1011 female who presents with worsening low abd pain x 2 days. Unsure if it is interstitial cystitis pain or more like menstrual cramps. Took 2 Vicodin at 2000 and 1 tramadol and 800 mg Ibuprofen at 2300 w/ no relief. Aslo C/O irreg periods, sometimes heavy. Both Sx worse since having baby last April. Has Nexplanon for [facility]. Has had IC pain controlled w/ DMSO Tx in the past, but cannot afford now due to loss of insurance.  Describes pain as constant, sharp, suprapubic pain gradually worsening over past two days.   Past Medical History  Diagnosis Date  . Interstitial cystitis   . Protein in urine   . Abnormal Pap smear   . Ovarian cyst   . GBS (group B streptococcus) UTI complicating pregnancy 04/17/2012   OB History  Gravida Para Term Preterm AB SAB TAB Ectopic Multiple Living  2 1 1  1 1    1     # Outcome Date GA Lbr Len/2nd Weight Sex Delivery Anes PTL Lv  2 TRM 06/30/12 [redacted]w[redacted]d 29:17 / 00:24 3.22 kg (7 lb 1.6 oz) F SVD EPI  Y  1 SAB 2010             Past Surgical History  Procedure Laterality Date  . Dilation and curettage of uterus     History   Social History  . Marital Status: Single    Spouse Name: N/A    Number of Children: N/A  . Years of Education: N/A   Occupational History  . Not on file.   Social History Main Topics  . Smoking status: Current Every Day Smoker -- 1.00 packs/day    Types: Cigarettes  . Smokeless tobacco: Never Used  . Alcohol Use: No     Comment: occ  . Drug Use: No     Comment: no drug use since pregnancy-  NONE SINCE   . Sexual Activity: Not Currently    Birth Control/ Protection: Implant   Other Topics Concern  . Not on file   Social History Narrative  . No narrative on file   No current facility-administered medications on file prior to encounter.   Current Outpatient  Prescriptions on File Prior to Encounter  Medication Sig Dispense Refill  . etonogestrel (IMPLANON) 68 MG IMPL implant Inject 1 each into the skin once.      Marland Kitchen HYDROcodone-acetaminophen (NORCO/VICODIN) 5-325 MG per tablet Take 1-2 tablets by mouth every 4 (four) hours as needed for pain.  20 tablet  0  . ibuprofen (ADVIL,MOTRIN) 800 MG tablet Take 1 tablet (800 mg total) by mouth every 8 (eight) hours as needed for pain.  30 tablet  0   Allergies  Allergen Reactions  . Clarithromycin Anaphylaxis    ROS: Pertinent pos items in HPI. Neg for fever, chills, dysuria, hematuria, urgency, frequency, flank pain, dyspareunia, vaginal bleeding, vaginal discharge, N/V, diarrhea constipation. Last BM yesterday.   OBJECTIVE Blood pressure 135/82, pulse 78, temperature 98.5 F (36.9 C), temperature source Oral, resp. rate 20, height 5\' 4"  (1.626 m), weight 57.777 kg (127 lb 6 oz), last menstrual period 01/31/2013. GENERAL: Well-developed, well-nourished female in moderate distress. General discomfort w/ entire exam.  HEENT: Normocephalic HEART: normal rate RESP: normal effort ABDOMEN: Mildly distended, mild low abd tenderness. Neg mass or rebound. Hyperactive bowel sounds x 4. No CVAT.  EXTREMITIES: Nontender, no edema NEURO: Alert and oriented SPECULUM EXAM: NEFG except for 1.5x1 cm area of dark discoloration btw left labia minora and majora. NT. Physiologic discharge, no odor, no blood noted, cervix clean. 1 cm ectropion.  BIMANUAL: cervix closed; uterus normal size, mild bilat adnexal tenderness. No masses. No CMT.  LAB RESULTS Results for orders placed during the hospital encounter of 02/22/13 (from the past 24 hour(s))  URINALYSIS, ROUTINE W REFLEX MICROSCOPIC     Status: None   Collection Time    02/22/13  1:25 AM      Result Value Range   Color, Urine YELLOW  YELLOW   APPearance CLEAR  CLEAR   Specific Gravity, Urine 1.020  1.005 - 1.030   pH 6.5  5.0 - 8.0   Glucose, UA NEGATIVE   NEGATIVE mg/dL   Hgb urine dipstick NEGATIVE  NEGATIVE   Bilirubin Urine NEGATIVE  NEGATIVE   Ketones, ur NEGATIVE  NEGATIVE mg/dL   Protein, ur NEGATIVE  NEGATIVE mg/dL   Urobilinogen, UA 0.2  0.0 - 1.0 mg/dL   Nitrite NEGATIVE  NEGATIVE   Leukocytes, UA NEGATIVE  NEGATIVE  CBC     Status: Abnormal   Collection Time    02/22/13  3:12 AM      Result Value Range   WBC 6.3  4.0 - 10.5 K/uL   RBC 3.77 (*) 3.87 - 5.11 MIL/uL   Hemoglobin 12.3  12.0 - 15.0 g/dL   HCT 86.5 (*) 78.4 - 69.6 %   MCV 94.2  78.0 - 100.0 fL   MCH 32.6  26.0 - 34.0 pg   MCHC 34.6  30.0 - 36.0 g/dL   RDW 29.5  28.4 - 13.2 %   Platelets 241  150 - 400 K/uL  WET PREP, GENITAL     Status: Abnormal   Collection Time    02/22/13  3:25 AM      Result Value Range   Yeast Wet Prep HPF POC NONE SEEN  NONE SEEN   Trich, Wet Prep NONE SEEN  NONE SEEN   Clue Cells Wet Prep HPF POC FEW (*) NONE SEEN   WBC, Wet Prep HPF POC MODERATE (*) NONE SEEN    IMAGING US Transvaginal Non-ob  02/22/2013   CLINICAL DATA:  Pelvic pain with cramping.  EXAM: TRANSABDOMINAL AND TRANSVAGINAL ULTRASOUND OF PELVIS  TECHNIQUE: Both transabdominal and transvaginal ultrasound examinations of the pelvis were performed. Transabdominal technique was performed for global imaging of the pelvis including uterus, ovaries, adnexal regions, and pelvic cul-de-sac. It was necessary to proceed with endovaginal exam following the transabdominal exam to visualize the ovaries and endometrium.  COMPARISON:  10/15/2012  FINDINGS: Uterus  Measurements: 8 x 3 x 5 cm. No fibroids or other mass visualized.  Endometrium  Thickness: 6 mm.  No focal abnormality visualized.  Right ovary  Measurements: 3.2 x 3.6 x 2.4 cm. Normal appearance/no adnexal mass.  Left ovary  Measurements: 2.5 x 1.4 x 2.9 cm. Normal appearance/no adnexal mass.  Other findings  Small volume, simple appearing free pelvic fluid - likely physiologic.  IMPRESSION: Unremarkable pelvic sonogram.    Electronically Signed   By: Tiburcio Pea M.D.   On: 02/22/2013 03:24   MAU COURSE Toradol, CBC, [place], GC/CT  and wet prep.   No relief of pain. Very uncomfortable-acting. 2 Percocet ordered. Consulted w/ Dr. Macon Large. Agrees w/ POC. No new orders.   Pt resting comfortably.   ASSESSMENT 1. Interstitial cystitis   2. Constipation  PLAN Discharge home in stable condition. The is no evidence of a gynecologic cause for your pain. It is likely due to to either a flair-up of interstitial cystitis and/or constipation. Take MiraLax daily x 4 days. Take Ibuprofen every 8 hours on schedule for 2 days. Use Percocet sparingly for breakthrough pain. It causes constipation and may worsen you pain if taken too often.    Follow-up Information   Follow up with Urologist. (for interstitial cyctitis pain.)       Follow up with Gynecologist. (As needed for menstrual irregularities)       Follow up with [facility]. (As needed in emergencies)    Contact information:   [address] [place] [address]        Medication List         etonogestrel 68 MG Impl implant  Commonly known as:  IMPLANON  Inject 1 each into the skin once.     HYDROcodone-acetaminophen 5-325 MG per tablet  Commonly known as:  NORCO/VICODIN  Take 1-2 tablets by mouth every 4 (four) hours as needed for pain.     ibuprofen 800 MG tablet  Commonly known as:  ADVIL  Take 1 tablet (800 mg total) by mouth every 8 (eight) hours as needed for moderate pain.     oxyCODONE-acetaminophen 5-325 MG per tablet  Commonly known as:  PERCOCET/ROXICET  Take 1-2 tablets by mouth every 4 (four) hours as needed.     polyethylene glycol packet  Commonly known as:  MIRALAX  Take 17 g by mouth daily.       Renner Corner, CNM 02/22/2013  4:36 AM

## 2013-02-22 NOTE — MAU Provider Note (Signed)
Attestation of Attending Supervision of Advanced Practitioner (PA/CNM/NP): Evaluation and management procedures were performed by the Advanced Practitioner under my supervision and collaboration.  I have reviewed the Advanced Practitioner's note and chart, and I agree with the management and plan.    , MD, FACOG Attending Obstetrician & Gynecologist Faculty Practice, [facility]  

## 2013-02-23 LAB — GC/CHLAMYDIA PROBE AMP
CT Probe RNA: NEGATIVE
GC Probe RNA: NEGATIVE

## 2013-05-08 ENCOUNTER — Emergency Department (HOSPITAL_COMMUNITY)
Admission: EM | Admit: 2013-05-08 | Discharge: 2013-05-09 | Disposition: A | Discharge status: Home / Self Care | Payer: Self-pay | Attending: Emergency Medicine | Admitting: Emergency Medicine

## 2013-05-08 ENCOUNTER — Encounter (HOSPITAL_COMMUNITY): Payer: Self-pay | Admitting: Emergency Medicine

## 2013-05-08 DIAGNOSIS — Z87440 Personal history of urinary (tract) infections: Secondary | ICD-10-CM | POA: Insufficient documentation

## 2013-05-08 DIAGNOSIS — N12 Tubulo-interstitial nephritis, not specified as acute or chronic: Secondary | ICD-10-CM | POA: Insufficient documentation

## 2013-05-08 DIAGNOSIS — Z881 Allergy status to other antibiotic agents status: Secondary | ICD-10-CM | POA: Insufficient documentation

## 2013-05-08 DIAGNOSIS — R000 Tachycardia, unspecified: Secondary | ICD-10-CM | POA: Insufficient documentation

## 2013-05-08 DIAGNOSIS — R809 Proteinuria, unspecified: Secondary | ICD-10-CM | POA: Insufficient documentation

## 2013-05-08 DIAGNOSIS — F17200 Nicotine dependence, unspecified, uncomplicated: Secondary | ICD-10-CM | POA: Insufficient documentation

## 2013-05-08 DIAGNOSIS — N3010 Interstitial cystitis (chronic) without hematuria: Secondary | ICD-10-CM | POA: Insufficient documentation

## 2013-05-08 DIAGNOSIS — Z79899 Other long term (current) drug therapy: Secondary | ICD-10-CM | POA: Insufficient documentation

## 2013-05-08 DIAGNOSIS — N83209 Unspecified ovarian cyst, unspecified side: Secondary | ICD-10-CM | POA: Insufficient documentation

## 2013-05-08 LAB — URINALYSIS, ROUTINE W REFLEX MICROSCOPIC
Glucose, UA: NEGATIVE mg/dL
Ketones, ur: NEGATIVE mg/dL
Nitrite: POSITIVE — AB
Protein, ur: 100 mg/dL — AB
SPECIFIC GRAVITY, URINE: 1.025 (ref 1.005–1.030)
Urobilinogen, UA: 1 mg/dL (ref 0.0–1.0)
pH: 5.5 (ref 5.0–8.0)

## 2013-05-08 LAB — CBC WITH DIFFERENTIAL/PLATELET
Basophils Absolute: 0 10*3/uL (ref 0.0–0.1)
Basophils Relative: 0 % (ref 0–1)
EOS ABS: 0 10*3/uL (ref 0.0–0.7)
Eosinophils Relative: 0 % (ref 0–5)
HCT: 37.5 % (ref 36.0–46.0)
HEMOGLOBIN: 13 g/dL (ref 12.0–15.0)
LYMPHS ABS: 0.5 10*3/uL — AB (ref 0.7–4.0)
LYMPHS PCT: 4 % — AB (ref 12–46)
MCH: 33.3 pg (ref 26.0–34.0)
MCHC: 34.7 g/dL (ref 30.0–36.0)
MCV: 96.2 fL (ref 78.0–100.0)
MONOS PCT: 4 % (ref 3–12)
Monocytes Absolute: 0.5 10*3/uL (ref 0.1–1.0)
NEUTROS ABS: 10.3 10*3/uL — AB (ref 1.7–7.7)
Neutrophils Relative %: 92 % — ABNORMAL HIGH (ref 43–77)
Platelets: 225 10*3/uL (ref 150–400)
RBC: 3.9 MIL/uL (ref 3.87–5.11)
RDW: 12.8 % (ref 11.5–15.5)
WBC: 11.3 10*3/uL — AB (ref 4.0–10.5)

## 2013-05-08 LAB — URINE MICROSCOPIC-ADD ON

## 2013-05-08 LAB — COMPREHENSIVE METABOLIC PANEL
ALK PHOS: 57 U/L (ref 39–117)
ALT: 6 U/L (ref 0–35)
AST: 8 U/L (ref 0–37)
Albumin: 3.9 g/dL (ref 3.5–5.2)
BUN: 5 mg/dL — AB (ref 6–23)
CHLORIDE: 99 meq/L (ref 96–112)
CO2: 23 meq/L (ref 19–32)
Calcium: 9 mg/dL (ref 8.4–10.5)
Creatinine, Ser: 0.68 mg/dL (ref 0.50–1.10)
GFR calc Af Amer: >90 mL/min (ref >90)
GLUCOSE: 165 mg/dL — AB (ref 70–99)
POTASSIUM: 3.3 meq/L — AB (ref 3.7–5.3)
SODIUM: 139 meq/L (ref 137–147)
Total Bilirubin: 0.5 mg/dL (ref 0.3–1.2)
Total Protein: 7.1 g/dL (ref 6.0–8.3)

## 2013-05-08 LAB — POC URINE PREG, ED: PREG TEST UR: NEGATIVE

## 2013-05-08 MED ORDER — ACETAMINOPHEN 325 MG PO TABS
650.0000 mg | ORAL_TABLET | Freq: Once | ORAL | Status: AC
  Administered 2013-05-08: 650 mg via ORAL
  Filled 2013-05-08: qty 2

## 2013-05-08 NOTE — ED Notes (Signed)
Pt. reports low abdominal pain / low back pain with nausea and vomitting onset 2 days ago , slight dysuria , pt. stated history of interstitial cystitis and ovarian cyst.

## 2013-05-09 ENCOUNTER — Emergency Department (HOSPITAL_COMMUNITY): Payer: Medicaid Other

## 2013-05-09 MED ORDER — NAPROXEN 500 MG PO TABS: 500.0000 mg | ORAL_TABLET | Freq: Two times a day (BID) | ORAL | Status: DC

## 2013-05-09 MED ORDER — MORPHINE SULFATE 4 MG/ML IJ SOLN
2.0000 mg | Freq: Once | INTRAMUSCULAR | Status: AC
  Administered 2013-05-09: 2 mg via INTRAVENOUS
  Filled 2013-05-09: qty 1

## 2013-05-09 MED ORDER — DEXTROSE 5 % IV SOLN
1.0000 g | Freq: Once | INTRAVENOUS | Status: AC
  Administered 2013-05-09: 1 g via INTRAVENOUS
  Filled 2013-05-09: qty 10

## 2013-05-09 MED ORDER — HYDROCODONE-ACETAMINOPHEN 5-325 MG PO TABS: 2.0000 | ORAL_TABLET | ORAL | Status: DC | PRN

## 2013-05-09 MED ORDER — ONDANSETRON 4 MG PO TBDP: 4.0000 mg | ORAL_TABLET | Freq: Three times a day (TID) | ORAL | Status: DC | PRN

## 2013-05-09 MED ORDER — ACETAMINOPHEN 500 MG PO TABS
1000.0000 mg | ORAL_TABLET | Freq: Once | ORAL | Status: AC
  Administered 2013-05-09: 1000 mg via ORAL
  Filled 2013-05-09: qty 2

## 2013-05-09 MED ORDER — SODIUM CHLORIDE 0.9 % IV BOLUS (SEPSIS)
1000.0000 mL | Freq: Once | INTRAVENOUS | Status: AC
  Administered 2013-05-09: 1000 mL via INTRAVENOUS

## 2013-05-09 MED ORDER — MORPHINE SULFATE 4 MG/ML IJ SOLN
4.0000 mg | Freq: Once | INTRAMUSCULAR | Status: AC
  Administered 2013-05-09: 4 mg via INTRAVENOUS
  Filled 2013-05-09: qty 1

## 2013-05-09 MED ORDER — ONDANSETRON HCL 4 MG/2ML IJ SOLN
4.0000 mg | Freq: Once | INTRAMUSCULAR | Status: AC
  Administered 2013-05-09: 4 mg via INTRAVENOUS
  Filled 2013-05-09: qty 2

## 2013-05-09 MED ORDER — CEPHALEXIN 500 MG PO CAPS: 500.0000 mg | ORAL_CAPSULE | Freq: Four times a day (QID) | ORAL | Status: DC

## 2013-05-09 MED ORDER — KETOROLAC TROMETHAMINE 30 MG/ML IJ SOLN
30.0000 mg | Freq: Once | INTRAMUSCULAR | Status: AC
  Administered 2013-05-09: 30 mg via INTRAVENOUS
  Filled 2013-05-09: qty 1

## 2013-05-09 NOTE — ED Provider Notes (Signed)
CSN: [id number]     Arrival date & time 05/08/13  1856 History   First MD Initiated Contact with Patient 05/09/13 0002     Chief Complaint  Patient presents with  . Abdominal Pain     (Consider location/radiation/quality/duration/timing/severity/associated sxs/prior Treatment) HPI Comments: 23 year old female with a history of interstitial cystitis as well as smoking history presents with a complaint of abdominal pain. This started 3 days ago, is persistent, gradually worsening and located in the bilateral lower abdomen. She does note associated dysuria, associated fever and chills and has had nausea today causing 2 episodes of vomiting. She has continued to be able to eat and drink and hold down most of what she needs and drinks. She does have a fever of 102.6. This pain does radiate to her right lower back. She does have an associated cough since several days ago when she was at a bounce house with her child. His cough is nonproductive, it does occasionally cause mild pain in her abdomen when she coughs.    Patient is a 23 y.o. female presenting with abdominal pain. The history is provided by the patient and a relative.  Abdominal Pain   Past Medical History  Diagnosis Date  . Interstitial cystitis   . Protein in urine   . Abnormal Pap smear   . Ovarian cyst   . IC (interstitial cystitis) 04/17/2012  . GBS (group B streptococcus) UTI complicating pregnancy [id number]   Past Surgical History  Procedure Laterality Date  . Dilation and curettage of uterus     Family History  Problem Relation Age of Onset  . Cancer Maternal Uncle   . Other Neg Hx   . Diabetes Maternal Grandmother   . Cancer Maternal Grandmother     colon  . Diabetes Paternal Grandmother   . Cancer Paternal Grandmother     bone   History  Substance Use Topics  . Smoking status: Current Every Day Smoker -- 1.00 packs/day    Types: Cigarettes  . Smokeless tobacco: Never Used  . Alcohol Use: No     Comment: occ    OB History   Grav Para Term Preterm Abortions TAB SAB Ect Mult Living   2 1 1  1  1   1      Review of Systems  Gastrointestinal: Positive for abdominal pain.  All other systems reviewed and are negative.      Allergies  Clarithromycin  Home Medications   Current Outpatient Rx  Name  Route  Sig  Dispense  Refill  . ibuprofen (ADVIL) 800 MG tablet   Oral   Take 1 tablet (800 mg total) by mouth every 8 (eight) hours as needed for moderate pain.   30 tablet   1   . oxyCODONE-acetaminophen (PERCOCET/ROXICET) 5-325 MG per tablet   Oral   Take 1-2 tablets by mouth every 4 (four) hours as needed.   10 tablet   0   . cephALEXin (KEFLEX) 500 MG capsule   Oral   Take 1 capsule (500 mg total) by mouth 4 (four) times daily.   40 capsule   0   . etonogestrel (IMPLANON) 68 MG IMPL implant   Subcutaneous   Inject 1 each into the skin once.         Marland Kitchen HYDROcodone-acetaminophen (NORCO/VICODIN) 5-325 MG per tablet   Oral   Take 2 tablets by mouth every 4 (four) hours as needed.   10 tablet   0   . naproxen (NAPROSYN) 500  MG tablet   Oral   Take 1 tablet (500 mg total) by mouth 2 (two) times daily with a meal.   30 tablet   0   . ondansetron (ZOFRAN ODT) 4 MG disintegrating tablet   Oral   Take 1 tablet (4 mg total) by mouth every 8 (eight) hours as needed for nausea.   10 tablet   0    BP 105/71  Pulse 95  Temp(Src) 98.5 F (36.9 C) (Oral)  Resp 18  SpO2 100%  LMP 05/03/2013 Physical Exam  Nursing note and vitals reviewed. Constitutional: She appears well-developed and well-nourished.  Uncomfortable appearing  HENT:  Head: Normocephalic and atraumatic.  Mouth/Throat: No oropharyngeal exudate.  Mucous membranes dehydrated  Eyes: Conjunctivae and EOM are normal. Pupils are equal, round, and reactive to light. Right eye exhibits no discharge. Left eye exhibits no discharge. No scleral icterus.  Neck: Normal range of motion. Neck supple. No JVD present. No  thyromegaly present.  Cardiovascular: Regular rhythm, normal heart sounds and intact distal pulses.  Exam reveals no gallop and no friction rub.   No murmur heard. Tachycardic to 125 beats per minute, strong peripheral pulses, normal capillary refill, no JVD  Pulmonary/Chest: Effort normal and breath sounds normal. No respiratory distress. She has no wheezes. She has no rales.  Occasional rhonchi, no increased work of breathing, no wheezes or rales, occasional coughing during the exam  Abdominal: Soft. Bowel sounds are normal. She exhibits no distension and no mass. There is tenderness (minimal right and left lower cord or tenderness, no guarding, no peritoneal signs).  CVA tenderness on the right is present  Musculoskeletal: Normal range of motion. She exhibits no edema and no tenderness.  Lymphadenopathy:    She has no cervical adenopathy.  Neurological: She is alert. Coordination normal.  Skin: Skin is warm and dry. No rash noted. No erythema.  Psychiatric: She has a normal mood and affect. Her behavior is normal.    ED Course  Procedures (including critical care time) Labs Review Labs Reviewed  CBC WITH DIFFERENTIAL - Abnormal; Notable for the following:    WBC 11.3 (*)    Neutrophils Relative % 92 (*)    Neutro Abs 10.3 (*)    Lymphocytes Relative 4 (*)    Lymphs Abs 0.5 (*)    All other components within normal limits  COMPREHENSIVE METABOLIC PANEL - Abnormal; Notable for the following:    Potassium 3.3 (*)    Glucose, Bld 165 (*)    BUN 5 (*)    All other components within normal limits  URINALYSIS, ROUTINE W REFLEX MICROSCOPIC - Abnormal; Notable for the following:    Color, Urine AMBER (*)    APPearance TURBID (*)    Hgb urine dipstick LARGE (*)    Bilirubin Urine SMALL (*)    Protein, ur 100 (*)    Nitrite POSITIVE (*)    Leukocytes, UA LARGE (*)    All other components within normal limits  URINE MICROSCOPIC-ADD ON - Abnormal; Notable for the following:     Squamous Epithelial / LPF MANY (*)    Bacteria, UA MANY (*)    All other components within normal limits  URINE CULTURE  POC URINE PREG, ED   Imaging Review Dg Chest 2 View  05/09/2013   CLINICAL DATA:  Shortness of breath  EXAM: CHEST  2 VIEW  COMPARISON:  12/20/2010  FINDINGS: No edema, consolidation, effusion, or pneumothorax. Nipple shadows noted. Mild thoracic dextroscoliosis. Normal heart size.  IMPRESSION: No active cardiopulmonary disease.   Electronically Signed   By: Jorje Guild M.D.   On: 05/09/2013 01:52     EKG Interpretation None      MDM   Final diagnoses:  Pyelonephritis    The patient appears uncomfortable, her exam is concerning for a systemic inflammatory response syndrome, she does have a temperature of 102.6 and is tachycardic at 125. Her leukocytosis is mild, her urinalysis is significant for a urinary infection which I suspect is causing pyelonephritis within a sending infection at this time. I will give her an antipyretic, IV fluids, IV antibiotics and perform a chest x-ray to rule out a coexistent pulmonary infection. The patient is in agreement with this plan, she will start drinking oral fluids after IV Zofran.  The patient has received IV antibiotics, IV fluids, has been able to tolerate fluids without difficulty.  her fever has defervesced, tachycardia has resolved, she is not hypotensive and weeks SIRS criteria.  She requests d/c, followup instructions given including return criteria, understanding expressed   Meds given in ED:  Medications  acetaminophen (TYLENOL) tablet 650 mg (650 mg Oral Given 05/08/13 1926)  sodium chloride 0.9 % bolus 1,000 mL (0 mLs Intravenous Stopped 05/09/13 0416)  sodium chloride 0.9 % bolus 1,000 mL (0 mLs Intravenous Stopped 05/09/13 0201)  ondansetron (ZOFRAN) injection 4 mg (4 mg Intravenous Given 05/09/13 0049)  cefTRIAXone (ROCEPHIN) 1 g in dextrose 5 % 50 mL IVPB (0 g Intravenous Stopped 05/09/13 0121)  acetaminophen  (TYLENOL) tablet 1,000 mg (1,000 mg Oral Given 05/09/13 0050)  ketorolac (TORADOL) 30 MG/ML injection 30 mg (30 mg Intravenous Given 05/09/13 0158)  morphine 4 MG/ML injection 4 mg (4 mg Intravenous Given 05/09/13 0335)  morphine 4 MG/ML injection 2 mg (2 mg Intravenous Given 05/09/13 0415)    New Prescriptions   CEPHALEXIN (KEFLEX) 500 MG CAPSULE    Take 1 capsule (500 mg total) by mouth 4 (four) times daily.   HYDROCODONE-ACETAMINOPHEN (NORCO/VICODIN) 5-325 MG PER TABLET    Take 2 tablets by mouth every 4 (four) hours as needed.   NAPROXEN (NAPROSYN) 500 MG TABLET    Take 1 tablet (500 mg total) by mouth 2 (two) times daily with a meal.   ONDANSETRON (ZOFRAN ODT) 4 MG DISINTEGRATING TABLET    Take 1 tablet (4 mg total) by mouth every 8 (eight) hours as needed for nausea.      Johnna Acosta, MD 05/09/13 [phone]

## 2013-05-09 NOTE — Discharge Instructions (Signed)
Please call your doctor for a followup appointment within 24-48 hours. When you talk to your doctor please let them know that you were seen in the emergency department and have them acquire all of your records so that they can discuss the findings with you and formulate a treatment plan to fully care for your new and ongoing problems. ° °

## 2013-05-09 NOTE — ED Notes (Signed)
Dr Sabra Heck in room

## 2013-05-10 ENCOUNTER — Inpatient Hospital Stay (HOSPITAL_COMMUNITY)
Admission: EM | Admit: 2013-05-10 | Discharge: 2013-05-14 | DRG: 871 | Disposition: A | Discharge status: Home / Self Care | Payer: Medicaid Other | Attending: Internal Medicine | Admitting: Internal Medicine

## 2013-05-10 ENCOUNTER — Encounter (HOSPITAL_COMMUNITY): Payer: Self-pay | Admitting: Emergency Medicine

## 2013-05-10 DIAGNOSIS — F17200 Nicotine dependence, unspecified, uncomplicated: Secondary | ICD-10-CM | POA: Diagnosis present

## 2013-05-10 DIAGNOSIS — R509 Fever, unspecified: Secondary | ICD-10-CM | POA: Diagnosis present

## 2013-05-10 DIAGNOSIS — N12 Tubulo-interstitial nephritis, not specified as acute or chronic: Secondary | ICD-10-CM | POA: Diagnosis present

## 2013-05-10 DIAGNOSIS — A498 Other bacterial infections of unspecified site: Secondary | ICD-10-CM | POA: Diagnosis present

## 2013-05-10 DIAGNOSIS — Z881 Allergy status to other antibiotic agents status: Secondary | ICD-10-CM

## 2013-05-10 DIAGNOSIS — N151 Renal and perinephric abscess: Secondary | ICD-10-CM | POA: Diagnosis present

## 2013-05-10 DIAGNOSIS — A419 Sepsis, unspecified organism: Principal | ICD-10-CM

## 2013-05-10 DIAGNOSIS — Z833 Family history of diabetes mellitus: Secondary | ICD-10-CM

## 2013-05-10 DIAGNOSIS — O2340 Unspecified infection of urinary tract in pregnancy, unspecified trimester: Secondary | ICD-10-CM

## 2013-05-10 DIAGNOSIS — N3010 Interstitial cystitis (chronic) without hematuria: Secondary | ICD-10-CM | POA: Diagnosis present

## 2013-05-10 LAB — URINE MICROSCOPIC-ADD ON

## 2013-05-10 LAB — URINALYSIS, ROUTINE W REFLEX MICROSCOPIC
Bilirubin Urine: NEGATIVE
Glucose, UA: NEGATIVE mg/dL
KETONES UR: NEGATIVE mg/dL
NITRITE: NEGATIVE
PH: 7 (ref 5.0–8.0)
Protein, ur: NEGATIVE mg/dL
SPECIFIC GRAVITY, URINE: 1.01 (ref 1.005–1.030)
Urobilinogen, UA: 1 mg/dL (ref 0.0–1.0)

## 2013-05-10 LAB — CBC WITH DIFFERENTIAL/PLATELET
BASOS PCT: 0 % (ref 0–1)
Basophils Absolute: 0 10*3/uL (ref 0.0–0.1)
EOS ABS: 0 10*3/uL (ref 0.0–0.7)
EOS PCT: 1 % (ref 0–5)
HCT: 35.2 % — ABNORMAL LOW (ref 36.0–46.0)
Hemoglobin: 12.4 g/dL (ref 12.0–15.0)
LYMPHS ABS: 1.5 10*3/uL (ref 0.7–4.0)
Lymphocytes Relative: 20 % (ref 12–46)
MCH: 33.3 pg (ref 26.0–34.0)
MCHC: 35.2 g/dL (ref 30.0–36.0)
MCV: 94.6 fL (ref 78.0–100.0)
Monocytes Absolute: 0.6 10*3/uL (ref 0.1–1.0)
Monocytes Relative: 8 % (ref 3–12)
Neutro Abs: 5.4 10*3/uL (ref 1.7–7.7)
Neutrophils Relative %: 72 % (ref 43–77)
PLATELETS: 218 10*3/uL (ref 150–400)
RBC: 3.72 MIL/uL — AB (ref 3.87–5.11)
RDW: 12.6 % (ref 11.5–15.5)
WBC: 7.5 10*3/uL (ref 4.0–10.5)

## 2013-05-10 LAB — COMPREHENSIVE METABOLIC PANEL
ALBUMIN: 3.3 g/dL — AB (ref 3.5–5.2)
ALT: 7 U/L (ref 0–35)
AST: 11 U/L (ref 0–37)
Alkaline Phosphatase: 61 U/L (ref 39–117)
BUN: 4 mg/dL — AB (ref 6–23)
CALCIUM: 8.8 mg/dL (ref 8.4–10.5)
CO2: 27 mEq/L (ref 19–32)
Chloride: 101 mEq/L (ref 96–112)
Creatinine, Ser: 0.58 mg/dL (ref 0.50–1.10)
GFR calc non Af Amer: >90 mL/min (ref >90)
Glucose, Bld: 113 mg/dL — ABNORMAL HIGH (ref 70–99)
Potassium: 3.2 mEq/L — ABNORMAL LOW (ref 3.7–5.3)
Sodium: 141 mEq/L (ref 137–147)
TOTAL PROTEIN: 6.9 g/dL (ref 6.0–8.3)
Total Bilirubin: 0.2 mg/dL — ABNORMAL LOW (ref 0.3–1.2)

## 2013-05-10 LAB — POC URINE PREG, ED: Preg Test, Ur: NEGATIVE

## 2013-05-10 MED ORDER — DEXTROSE 5 % IV SOLN
1.0000 g | Freq: Once | INTRAVENOUS | Status: AC
  Administered 2013-05-11: 1 g via INTRAVENOUS
  Filled 2013-05-10: qty 10

## 2013-05-10 MED ORDER — IOHEXOL 300 MG/ML  SOLN
25.0000 mL | Freq: Once | INTRAMUSCULAR | Status: AC | PRN
  Administered 2013-05-10: 25 mL via ORAL

## 2013-05-10 MED ORDER — CIPROFLOXACIN IN D5W 400 MG/200ML IV SOLN
400.0000 mg | Freq: Once | INTRAVENOUS | Status: AC
  Administered 2013-05-11: 400 mg via INTRAVENOUS
  Filled 2013-05-10: qty 200

## 2013-05-10 MED ORDER — SODIUM CHLORIDE 0.9 % IV BOLUS (SEPSIS)
1000.0000 mL | Freq: Once | INTRAVENOUS | Status: AC
  Administered 2013-05-11: 1000 mL via INTRAVENOUS

## 2013-05-10 MED ORDER — MORPHINE SULFATE 4 MG/ML IJ SOLN
4.0000 mg | Freq: Once | INTRAMUSCULAR | Status: AC
  Administered 2013-05-10: 4 mg via INTRAVENOUS
  Filled 2013-05-10: qty 1

## 2013-05-10 MED ORDER — ACETAMINOPHEN 325 MG PO TABS
650.0000 mg | ORAL_TABLET | Freq: Four times a day (QID) | ORAL | Status: DC | PRN
  Administered 2013-05-10: 650 mg via ORAL
  Filled 2013-05-10: qty 2

## 2013-05-10 NOTE — ED Notes (Signed)
C/o lower abd and R side/flank pain x 4 days.  Seen in ED 2 days ago for same.  States pain is worse and she continues to have a fever at home.  Taking Zofran that has improved nausea and vomiting.

## 2013-05-11 ENCOUNTER — Emergency Department (HOSPITAL_COMMUNITY): Payer: Medicaid Other

## 2013-05-11 DIAGNOSIS — R509 Fever, unspecified: Secondary | ICD-10-CM | POA: Diagnosis present

## 2013-05-11 DIAGNOSIS — F17200 Nicotine dependence, unspecified, uncomplicated: Secondary | ICD-10-CM

## 2013-05-11 DIAGNOSIS — N12 Tubulo-interstitial nephritis, not specified as acute or chronic: Secondary | ICD-10-CM | POA: Diagnosis present

## 2013-05-11 DIAGNOSIS — N151 Renal and perinephric abscess: Secondary | ICD-10-CM

## 2013-05-11 DIAGNOSIS — N3010 Interstitial cystitis (chronic) without hematuria: Secondary | ICD-10-CM

## 2013-05-11 DIAGNOSIS — A419 Sepsis, unspecified organism: Principal | ICD-10-CM

## 2013-05-11 LAB — CBC
HEMATOCRIT: 32 % — AB (ref 36.0–46.0)
Hemoglobin: 11.1 g/dL — ABNORMAL LOW (ref 12.0–15.0)
MCH: 32.6 pg (ref 26.0–34.0)
MCHC: 34.7 g/dL (ref 30.0–36.0)
MCV: 94.1 fL (ref 78.0–100.0)
Platelets: 197 10*3/uL (ref 150–400)
RBC: 3.4 MIL/uL — ABNORMAL LOW (ref 3.87–5.11)
RDW: 12.7 % (ref 11.5–15.5)
WBC: 6.2 10*3/uL (ref 4.0–10.5)

## 2013-05-11 LAB — BASIC METABOLIC PANEL
BUN: <3 mg/dL — ABNORMAL LOW (ref 6–23)
CHLORIDE: 101 meq/L (ref 96–112)
CO2: 24 mEq/L (ref 19–32)
Calcium: 8.1 mg/dL — ABNORMAL LOW (ref 8.4–10.5)
Creatinine, Ser: 0.53 mg/dL (ref 0.50–1.10)
GFR calc Af Amer: >90 mL/min (ref >90)
GFR calc non Af Amer: >90 mL/min (ref >90)
Glucose, Bld: 100 mg/dL — ABNORMAL HIGH (ref 70–99)
POTASSIUM: 3.6 meq/L — AB (ref 3.7–5.3)
Sodium: 140 mEq/L (ref 137–147)

## 2013-05-11 LAB — URINE CULTURE
Colony Count: >=100000
SPECIAL REQUESTS: NORMAL

## 2013-05-11 MED ORDER — ONDANSETRON HCL 4 MG/2ML IJ SOLN: 4.0000 mg | Freq: Four times a day (QID) | INTRAMUSCULAR | Status: DC | PRN

## 2013-05-11 MED ORDER — ACETAMINOPHEN 325 MG PO TABS
650.0000 mg | ORAL_TABLET | Freq: Four times a day (QID) | ORAL | Status: DC | PRN
  Administered 2013-05-11 – 2013-05-12 (×3): 650 mg via ORAL
  Filled 2013-05-11 (×4): qty 2

## 2013-05-11 MED ORDER — HYDROMORPHONE HCL PF 1 MG/ML IJ SOLN
0.5000 mg | INTRAMUSCULAR | Status: DC | PRN
  Administered 2013-05-11 – 2013-05-14 (×18): 1 mg via INTRAVENOUS
  Filled 2013-05-11 (×18): qty 1

## 2013-05-11 MED ORDER — OXYCODONE HCL 5 MG PO TABS
5.0000 mg | ORAL_TABLET | ORAL | Status: DC | PRN
  Administered 2013-05-11 – 2013-05-14 (×16): 5 mg via ORAL
  Filled 2013-05-11 (×16): qty 1

## 2013-05-11 MED ORDER — ALUM & MAG HYDROXIDE-SIMETH 200-200-20 MG/5ML PO SUSP: 30.0000 mL | Freq: Four times a day (QID) | ORAL | Status: DC | PRN

## 2013-05-11 MED ORDER — ONDANSETRON HCL 4 MG PO TABS: 4.0000 mg | ORAL_TABLET | Freq: Four times a day (QID) | ORAL | Status: DC | PRN

## 2013-05-11 MED ORDER — CIPROFLOXACIN IN D5W 400 MG/200ML IV SOLN
400.0000 mg | Freq: Two times a day (BID) | INTRAVENOUS | Status: DC
  Administered 2013-05-11 – 2013-05-13 (×5): 400 mg via INTRAVENOUS
  Filled 2013-05-11 (×6): qty 200

## 2013-05-11 MED ORDER — IOHEXOL 300 MG/ML  SOLN
100.0000 mL | Freq: Once | INTRAMUSCULAR | Status: AC | PRN
  Administered 2013-05-11: 100 mL via INTRAVENOUS

## 2013-05-11 MED ORDER — MORPHINE SULFATE 4 MG/ML IJ SOLN
4.0000 mg | Freq: Once | INTRAMUSCULAR | Status: AC
  Administered 2013-05-11: 4 mg via INTRAVENOUS
  Filled 2013-05-11: qty 1

## 2013-05-11 MED ORDER — ACETAMINOPHEN 650 MG RE SUPP
650.0000 mg | Freq: Four times a day (QID) | RECTAL | Status: DC | PRN
  Filled 2013-05-11: qty 1

## 2013-05-11 MED ORDER — METRONIDAZOLE IN NACL 5-0.79 MG/ML-% IV SOLN
500.0000 mg | Freq: Three times a day (TID) | INTRAVENOUS | Status: DC
  Administered 2013-05-11: 500 mg via INTRAVENOUS
  Filled 2013-05-11 (×3): qty 100

## 2013-05-11 MED ORDER — SODIUM CHLORIDE 0.9 % IV SOLN
INTRAVENOUS | Status: DC
  Administered 2013-05-11: 100 mL/h via INTRAVENOUS
  Administered 2013-05-12 – 2013-05-13 (×3): via INTRAVENOUS

## 2013-05-11 NOTE — Consult Note (Addendum)
I've been asked to see the patient by Dr. Orson Eva, M.D. for evaluation and management of right renal abscess.  History of present illness: Is a 23 year old female with a history of UTIs who presented to the emergency department several days ago with suprapubic/abdominal pain and pain with urination. She was diagnosed with pyelonephritis discharged home on Keflex. Urine culture grew greater than 100,000 Escherichia coli resistant to ampicillin and Bactrim. The patient who presented to the emergency department approximately 24 hours later with persistent fever and worsening pain. The pain is in the patient's right flank with radiation down across to the right abdomen. She has no associated hematuria or dysuria. She does have abdominal pain with urination. In the emergency department she was found to have a temperature of 102.6. A CT scan was performed showing a persistent right pyelonephritis with a small organizing collection, could be the early formation of a renal abscess. She was admitted to the hospitalist service and started on ceftriaxone and ciprofloxacin. The patient denies any constipation or bowel trouble. She has had associated nausea poor appetite. She denies any other weakness.  Review of systems: A comprehensive 12 point review of systems was obtained and is negative in the stated in the history of present illness.  Patient Active Problem List   Diagnosis Date Noted  . Pyelonephritis 05/11/2013  . Renal abscess 05/11/2013  . Fever, unspecified 05/11/2013  . Tobacco use disorder 05/11/2013  . Sepsis 05/11/2013  . SVD (spontaneous vaginal delivery) 07/02/2012  . UTI (urinary tract infection) during pregnancy 06/25/2012  . IC (interstitial cystitis) 04/17/2012   No current facility-administered medications on file prior to encounter.   Current Outpatient Prescriptions on File Prior to Encounter  Medication Sig Dispense Refill  . HYDROcodone-acetaminophen (NORCO/VICODIN) 5-325 MG per  tablet Take 2 tablets by mouth every 4 (four) hours as needed.  10 tablet  0  . ibuprofen (ADVIL) 800 MG tablet Take 1 tablet (800 mg total) by mouth every 8 (eight) hours as needed for moderate pain.  30 tablet  1  . naproxen (NAPROSYN) 500 MG tablet Take 1 tablet (500 mg total) by mouth 2 (two) times daily with a meal.  30 tablet  0  . ondansetron (ZOFRAN ODT) 4 MG disintegrating tablet Take 1 tablet (4 mg total) by mouth every 8 (eight) hours as needed for nausea.  10 tablet  0  . etonogestrel (IMPLANON) 68 MG IMPL implant Inject 1 each into the skin once.       Past Medical History  Diagnosis Date  . Interstitial cystitis   . Protein in urine   . Abnormal Pap smear   . Ovarian cyst   . IC (interstitial cystitis) 04/17/2012  . GBS (group B streptococcus) UTI complicating pregnancy 9/41/7408   Past Surgical History  Procedure Laterality Date  . Dilation and curettage of uterus     Family History  Problem Relation Age of Onset  . Cancer Maternal Uncle   . Other Neg Hx   . Diabetes Maternal Grandmother   . Cancer Maternal Grandmother     colon  . Diabetes Paternal Grandmother   . Cancer Paternal Grandmother     bone   History  Substance Use Topics  . Smoking status: Current Every Day Smoker -- 1.00 packs/day    Types: Cigarettes  . Smokeless tobacco: Never Used  . Alcohol Use: No     Comment: occ   PE: NAD, sitting upright in bed Filed Vitals:   05/11/13 0237 05/11/13  0258 05/11/13 0405 05/11/13 0429  BP: 103/56   108/67  Pulse: 79   91  Temp:  98.2 F (36.8 C)  98.1 F (36.7 C)  TempSrc:  Oral  Oral  Resp:    18  Height:   5\' 5"  (1.651 m)   Weight:   56.427 kg (124 lb 6.4 oz)   SpO2: 99%   97%    Intake/Output Summary (Last 24 hours) at 05/11/13 0923 Last data filed at 05/11/13 0904  Gross per 24 hour  Intake 1236.67 ml  Output      0 ml  Net 1236.67 ml   atraumatic normocephalic head, noninjected sclerae, moist mucous membranes RRR, no peripheral  edema Normal respiratory effort Patient's abdomen is soft, right CVA tenderness, right sided abdominal pain, suprapubic pain, no left-sided CVA or left-sided abdominal pain. Neurologic: Grossly intact MSK: Lower extremities symmetric no peripheral edema Skin no identifiable rashes or lesions Psych: Normal affect, appropriate traction   Recent Labs  05/08/13 1946 05/10/13 2005 05/11/13 0605  WBC 11.3* 7.5 6.2  HGB 13.0 12.4 11.1*  HCT 37.5 35.2* 32.0*    Recent Labs  05/08/13 1946 05/10/13 2005 05/11/13 0605  NA 139 141 140  K 3.3* 3.2* 3.6*  CL 99 101 101  CO2 23 27 24   GLUCOSE 165* 113* 100*  BUN 5* 4* <3*  CREATININE 0.68 0.58 0.53  CALCIUM 9.0 8.8 8.1*   No results found for this basename: LABPT, INR,  in the last 72 hours No results found for this basename: LABURIN,  in the last 72 hours Results for orders placed during the hospital encounter of 05/08/13  URINE CULTURE     Status: None   Collection Time    05/08/13  7:46 PM      Result Value Ref Range Status   Specimen Description URINE, CLEAN CATCH   Final   Special Requests Normal   Final   Culture  Setup Time     Final   Value: 05/09/2013 09:23     Performed at [facility]     Final   Value: >=100,000 COLONIES/ML     Performed at [facility]   Culture     Final   Value: ESCHERICHIA COLI     Performed at [facility]   Report Status 05/11/2013 FINAL   Final   Organism ID, Bacteria ESCHERICHIA COLI   Final    CT scan:  IMPRESSION:  24 x 20 x 26 mm developing right lateral midpole renal abscess.  Imp: Pyelonephritis with developing right renal abscess Rec: Would recommend continued medical therapy with broad-spectrum antibiotics with good renal penetration. Agree with ceftriaxone and Cipro. Would consider consulting ID for appropriate antibiotic choice given her recent cultures. Patient will continue to have periodic fevers secondary pyelonephritis, however her  fever spikes should trend down. If she's not making any progress, she may need a repeat CT scan and a percutaneous drain placed in the renal abscess.  We'll continue to follow.

## 2013-05-11 NOTE — ED Notes (Signed)
Dr. Jenkins at bedside. 

## 2013-05-11 NOTE — ED Provider Notes (Signed)
CSN: [id number]     Arrival date & time 05/10/13  1956 History   First MD Initiated Contact with Patient 05/10/13 2302     Chief Complaint  Patient presents with  . Flank Pain  . Fever     (Consider location/radiation/quality/duration/timing/severity/associated sxs/prior Treatment) HPI 23 year old female presents to emergency department with complaint of persistent fever, and right flank pain.  Patient was seen in the emergency department on the fifth and diagnosed with pyelonephritis.  She was encouraged to stay, but felt she needed to go home to take care of her kids.  Patient reports she's been taking her Keflex as prescribed, but has had persistent fevers, fatigue, and right flank pain.  She reports nausea and vomiting has resolved with Zofran.  She's been trying to stay hydrated. Past Medical History  Diagnosis Date  . Interstitial cystitis   . Protein in urine   . Abnormal Pap smear   . Ovarian cyst   . IC (interstitial cystitis) 04/17/2012  . GBS (group B streptococcus) UTI complicating pregnancy 07/13/3265   Past Surgical History  Procedure Laterality Date  . Dilation and curettage of uterus     Family History  Problem Relation Age of Onset  . Cancer Maternal Uncle   . Other Neg Hx   . Diabetes Maternal Grandmother   . Cancer Maternal Grandmother     colon  . Diabetes Paternal Grandmother   . Cancer Paternal Grandmother     bone   History  Substance Use Topics  . Smoking status: Current Every Day Smoker -- 1.00 packs/day    Types: Cigarettes  . Smokeless tobacco: Never Used  . Alcohol Use: No     Comment: occ   OB History   Grav Para Term Preterm Abortions TAB SAB Ect Mult Living   2 1 1  1  1   1      Review of Systems   See History of Present Illness; otherwise all other systems are reviewed and negative  Allergies  Clarithromycin  Home Medications   Current Outpatient Rx  Name  Route  Sig  Dispense  Refill  . acetaminophen (TYLENOL) 500 MG tablet  Oral   Take 500 mg by mouth every 6 (six) hours as needed.         . cephALEXin (KEFLEX) 500 MG capsule   Oral   Take 500 mg by mouth 4 (four) times daily. For 10 days. On day 1 of therapy         . HYDROcodone-acetaminophen (NORCO/VICODIN) 5-325 MG per tablet   Oral   Take 2 tablets by mouth every 4 (four) hours as needed.   10 tablet   0   . ibuprofen (ADVIL) 800 MG tablet   Oral   Take 1 tablet (800 mg total) by mouth every 8 (eight) hours as needed for moderate pain.   30 tablet   1   . naproxen (NAPROSYN) 500 MG tablet   Oral   Take 1 tablet (500 mg total) by mouth 2 (two) times daily with a meal.   30 tablet   0   . ondansetron (ZOFRAN ODT) 4 MG disintegrating tablet   Oral   Take 1 tablet (4 mg total) by mouth every 8 (eight) hours as needed for nausea.   10 tablet   0   . etonogestrel (IMPLANON) 68 MG IMPL implant   Subcutaneous   Inject 1 each into the skin once.  BP 128/68  Pulse 107  Temp(Src) 102.6 F (39.2 C) (Oral)  Resp 18  Wt 126 lb 7 oz (57.352 kg)  SpO2 97%  LMP 05/03/2013 Physical Exam  Nursing note and vitals reviewed. Constitutional: She is oriented to person, place, and time. She appears well-developed and well-nourished. She appears distressed (uncomfortable-appearing).  HENT:  Head: Normocephalic and atraumatic.  Nose: Nose normal.  Mouth/Throat: Oropharynx is clear and moist.  Eyes: Conjunctivae and EOM are normal. Pupils are equal, round, and reactive to light.  Neck: Normal range of motion. Neck supple. No JVD present. No tracheal deviation present. No thyromegaly present.  Cardiovascular: Regular rhythm, normal heart sounds and intact distal pulses.  Exam reveals no gallop and no friction rub.   No murmur heard. Tachycardia  Pulmonary/Chest: Effort normal and breath sounds normal. No stridor. No respiratory distress. She has no wheezes. She has no rales. She exhibits no tenderness.  Abdominal: Soft. Bowel sounds are  normal. She exhibits no distension and no mass. There is tenderness (diffuse right-sided abdominal pain, right CVA tenderness). There is no rebound and no guarding.  Musculoskeletal: Normal range of motion. She exhibits no edema and no tenderness.  Lymphadenopathy:    She has no cervical adenopathy.  Neurological: She is alert and oriented to person, place, and time. She exhibits normal muscle tone. Coordination normal.  Skin: Skin is warm and dry. No rash noted. No erythema. No pallor.  Psychiatric: She has a normal mood and affect. Her behavior is normal. Judgment and thought content normal.    ED Course  Procedures (including critical care time) Labs Review Labs Reviewed  CBC WITH DIFFERENTIAL - Abnormal; Notable for the following:    RBC 3.72 (*)    HCT 35.2 (*)    All other components within normal limits  COMPREHENSIVE METABOLIC PANEL - Abnormal; Notable for the following:    Potassium 3.2 (*)    Glucose, Bld 113 (*)    BUN 4 (*)    Albumin 3.3 (*)    Total Bilirubin 0.2 (*)    All other components within normal limits  URINALYSIS, ROUTINE W REFLEX MICROSCOPIC - Abnormal; Notable for the following:    APPearance CLOUDY (*)    Hgb urine dipstick SMALL (*)    Leukocytes, UA SMALL (*)    All other components within normal limits  URINE MICROSCOPIC-ADD ON - Abnormal; Notable for the following:    Squamous Epithelial / LPF MANY (*)    All other components within normal limits  BASIC METABOLIC PANEL  CBC  POC URINE PREG, ED   Imaging Review Ct Abdomen Pelvis W Contrast  05/11/2013   CLINICAL DATA:  Flank pain with fever.  Pyelonephritis.  EXAM: CT ABDOMEN AND PELVIS WITH CONTRAST  TECHNIQUE: Multidetector CT imaging of the abdomen and pelvis was performed using the standard protocol following bolus administration of intravenous contrast.  CONTRAST:  161mL OMNIPAQUE IOHEXOL 300 MG/ML  SOLN  COMPARISON:  None.  FINDINGS: 24 x 20 x 26 mm peripherally enhancing area of  hypoattenuation within the lateral mid pole of the right kidney with a much lower area of attenuation on its peripheral aspect. Findings consistent with developing renal abscess. Given this location there is not a clear percutaneous pathways of drainage although potentially a sample could be obtained by aspiration if needed for culture. No other similar lesions in the right or left kidney. No renal obstruction.  Negative lung bases and cardiac silhouette. Normal liver, spleen, pancreas, kidneys, adrenal glands, and  gallbladder. Common bile duct upper limits normal measuring 6 mm. No intrahepatic ductal dilatation. Unremarkable vascular structures. No adnexal lesion. No free fluid or free air. No adenopathy. Normal visualized stomach, small bowel, and colon. No appendiceal inflammation. Osseous structures.  IMPRESSION: 24 x 20 x 26 mm developing right lateral midpole renal abscess.   Electronically Signed   By: Rolla Flatten M.D.   On: 05/11/2013 01:33     EKG Interpretation None      MDM   Final diagnoses:  Pyelonephritis  Renal abscess    23 year old female with probable persistent pyelonephritis, failing outpatient treatment.  Her urine.  Overall looks improved from the one she had 2 days ago, but she is still febrile.  Concern for possible renal abscess, we'll get CT abdomen pelvis.  Will start on Rocephin and Cipro.  Patient will need admission to hospital     Kalman Drape, MD 05/11/13 [phone]

## 2013-05-11 NOTE — Progress Notes (Signed)
Called report from ED RN. Room ready for admit.

## 2013-05-11 NOTE — Progress Notes (Signed)
UR completed 
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
## 2013-05-11 NOTE — H&P (Signed)
[facility] History and Physical  Amber [name] TDD:[id number] DOB: [birth year] DOA: 05/10/2013  Referring physician: EDP PCP: No PCP Per Patient  Specialists:   Chief Complaint:  Lower ABD Pain and Right Flank Pain and Fever  HPI: Amber [name] is a 23 y.o. female who presents to the ED with complaints of severe Lower ABD Pain and Right sided Flank pain x 4 days.   She reports having fevers and chills, but denies havingn any nausea or vomiting.   She had been seen in the ED 2 days earlier and was diagnosed with pyelonephritis and placed on Keflex rx to continue as an outpatient but her symptoms worsened.  In the Ed she was evaluated and had a CT scan of the ABD Pelvis performed which revealed development of an right renal Abscess.  She was started on IV Cipro and Flagyl rx and referred for medical admission.      Review of Systems:  Constitutional: No Weight Loss, No Weight Gain, Night Sweats, +Fevers, Chills, Fatigue, or Generalized Weakness HEENT: No Headaches, Difficulty Swallowing,Tooth/Dental Problems,Sore Throat,  No Sneezing, Rhinitis, Ear Ache, Nasal Congestion, or Post Nasal Drip,  Cardio-vascular:  No Chest pain, Orthopnea, PND, Edema in lower extremities, Anasarca, Dizziness, Palpitations  Resp: No Dyspnea, No DOE, No Productive Cough, No Non-Productive Cough, No Hemoptysis, No Change in Color of Mucus,  No Wheezing.    GI: No Heartburn, Indigestion, Abdominal Pain, Nausea, Vomiting, Diarrhea, Change in Bowel Habits,  Loss of Appetite  GU: No +Dysuria, Change in Color of Urine, No Urgency or Frequency.  +Flank pain.  Musculoskeletal: No Joint Pain or Swelling.  No Decreased Range of Motion. No Back Pain.  Neurologic: No Syncope, No Seizures, Muscle Weakness, Paresthesia, Vision Disturbance or Loss, No Diplopia, No Vertigo, No Difficulty Walking,  Skin: No Rash or Lesions. Psych: No Change in Mood or Affect. No Depression or Anxiety. No Memory loss. No Confusion or  Hallucinations   Past Medical History  Diagnosis Date  . Interstitial cystitis   . Protein in urine   . Abnormal Pap smear   . Ovarian cyst   . IC (interstitial cystitis) 04/17/2012  . GBS (group B streptococcus) UTI complicating pregnancy 08/28/7626      Past Surgical History  Procedure Laterality Date  . Dilation and curettage of uterus         Prior to Admission medications   Medication Sig Start Date End Date Taking? Authorizing Provider  acetaminophen (TYLENOL) 500 MG tablet Take 500 mg by mouth every 6 (six) hours as needed.   Yes Historical Provider, MD  cephALEXin (KEFLEX) 500 MG capsule Take 500 mg by mouth 4 (four) times daily. For 10 days. On day 1 of therapy 05/09/13  Yes Johnna Acosta, MD  HYDROcodone-acetaminophen (NORCO/VICODIN) 5-325 MG per tablet Take 2 tablets by mouth every 4 (four) hours as needed. 05/09/13  Yes Johnna Acosta, MD  ibuprofen (ADVIL) 800 MG tablet Take 1 tablet (800 mg total) by mouth every 8 (eight) hours as needed for moderate pain. 02/22/13  Yes Manya Silvas, CNM  naproxen (NAPROSYN) 500 MG tablet Take 1 tablet (500 mg total) by mouth 2 (two) times daily with a meal. 05/09/13  Yes Johnna Acosta, MD  ondansetron (ZOFRAN ODT) 4 MG disintegrating tablet Take 1 tablet (4 mg total) by mouth every 8 (eight) hours as needed for nausea. 05/09/13  Yes Johnna Acosta, MD  etonogestrel (IMPLANON) 68 MG IMPL implant Inject 1 each into the skin  once.    Historical Provider, MD      Allergies  Allergen Reactions  . Clarithromycin Anaphylaxis     Social History:  reports that she has been smoking Cigarettes.  She has been smoking about 1.00 pack per day. She has never used smokeless tobacco. She reports that she does not drink alcohol or use illicit drugs.     Family History  Problem Relation Age of Onset  . Cancer Maternal Uncle   . Other Neg Hx   . Diabetes Maternal Grandmother   . Cancer Maternal Grandmother     colon  . Diabetes Paternal  Grandmother   . Cancer Paternal Grandmother     bone       Physical Exam:  GEN:  Pleasant Well Nourished and Well Developed   23 y.o. Caucasian female  examined  and in no acute distress; cooperative with exam Filed Vitals:   05/11/13 0237 05/11/13 0258 05/11/13 0405 05/11/13 0429  BP: 103/56   108/67  Pulse: 79   91  Temp:  98.2 F (36.8 C)  98.1 F (36.7 C)  TempSrc:  Oral  Oral  Resp:    18  Height:   5\' 5"  (1.651 m)   Weight:   56.427 kg (124 lb 6.4 oz)   SpO2: 99%   97%   Blood pressure 108/67, pulse 91, temperature 98.1 F (36.7 C), temperature source Oral, resp. rate 18, height 5\' 5"  (1.651 m), weight 56.427 kg (124 lb 6.4 oz), last menstrual period 05/03/2013, SpO2 97.00%. PSYCH: She is alert and oriented x4; does not appear anxious does not appear depressed; affect is normal HEENT: Normocephalic and Atraumatic, Mucous membranes pink; PERRLA; EOM intact; Fundi:  Benign;  No scleral icterus, Nares: Patent, Oropharynx: Clear, Fair Dentition, Neck:  FROM, no cervical lymphadenopathy nor thyromegaly or carotid bruit; no JVD; Breasts:: Not examined CHEST WALL: No tenderness CHEST: Normal respiration, clear to auscultation bilaterally HEART: Regular rate and rhythm; no murmurs rubs or gallops BACK: No kyphosis or scoliosis; + Right CVA Tendernes ABDOMEN: Positive Bowel Sounds, soft mildly Tenser in the Right ABD; no masses, no organomegaly. No Rebound and No Guarding.   Rectal Exam: Not done EXTREMITIES: No cyanosis, clubbing or edema; no ulcerations. Genitalia: not examined PULSES: 2+ and symmetric SKIN: Normal hydration no rash or ulceration CNS:  Alert and Oriented X 4,  No Focal Deficits.  Gait: deferred  Vascular: pulses palpable throughout    Labs on Admission:  Basic Metabolic Panel:  Recent Labs Lab 05/08/13 1946 05/10/13 2005 05/11/13 0605  NA 139 141 140  K 3.3* 3.2* 3.6*  CL 99 101 101  CO2 23 27 24   GLUCOSE 165* 113* 100*  BUN 5* 4* <3*  CREATININE  0.68 0.58 0.53  CALCIUM 9.0 8.8 8.1*   Liver Function Tests:  Recent Labs Lab 05/08/13 1946 05/10/13 2005  AST 8 11  ALT 6 7  ALKPHOS 57 61  BILITOT 0.5 0.2*  PROT 7.1 6.9  ALBUMIN 3.9 3.3*   No results found for this basename: LIPASE, AMYLASE,  in the last 168 hours No results found for this basename: AMMONIA,  in the last 168 hours CBC:  Recent Labs Lab 05/08/13 1946 05/10/13 2005 05/11/13 0605  WBC 11.3* 7.5 6.2  NEUTROABS 10.3* 5.4  --   HGB 13.0 12.4 11.1*  HCT 37.5 35.2* 32.0*  MCV 96.2 94.6 94.1  PLT 225 218 197   Cardiac Enzymes: No results found for this basename: CKTOTAL, CKMB, CKMBINDEX,  TROPONINI,  in the last 168 hours  BNP (last 3 results) No results found for this basename: PROBNP,  in the last 8760 hours CBG: No results found for this basename: GLUCAP,  in the last 168 hours  Radiological Exams on Admission: Ct Abdomen Pelvis W Contrast  05/11/2013   CLINICAL DATA:  Flank pain with fever.  Pyelonephritis.  EXAM: CT ABDOMEN AND PELVIS WITH CONTRAST  TECHNIQUE: Multidetector CT imaging of the abdomen and pelvis was performed using the standard protocol following bolus administration of intravenous contrast.  CONTRAST:  126mL OMNIPAQUE IOHEXOL 300 MG/ML  SOLN  COMPARISON:  None.  FINDINGS: 24 x 20 x 26 mm peripherally enhancing area of hypoattenuation within the lateral mid pole of the right kidney with a much lower area of attenuation on its peripheral aspect. Findings consistent with developing renal abscess. Given this location there is not a clear percutaneous pathways of drainage although potentially a sample could be obtained by aspiration if needed for culture. No other similar lesions in the right or left kidney. No renal obstruction.  Negative lung bases and cardiac silhouette. Normal liver, spleen, pancreas, kidneys, adrenal glands, and gallbladder. Common bile duct upper limits normal measuring 6 mm. No intrahepatic ductal dilatation. Unremarkable  vascular structures. No adnexal lesion. No free fluid or free air. No adenopathy. Normal visualized stomach, small bowel, and colon. No appendiceal inflammation. Osseous structures.  IMPRESSION: 24 x 20 x 26 mm developing right lateral midpole renal abscess.   Electronically Signed   By: Rolla Flatten M.D.   On: 05/11/2013 01:33      Assessment/Plan:   23 y.o. female with  Principal Problem:   Renal abscess Active Problems:   Pyelonephritis   Fever, unspecified   IC (interstitial cystitis)   Tobacco use disorder     1.   Renal Abscess-   Placed on IV Antibiotic Rx of Cipro and Flagyl, IVFs, Pain control and anti-Pyretics PRN.  .     2.   Pyelonephritis/UTI-  Partially treated, Covered by Antibiotic in #1.    3.   FeVer due to #1, and #2, Antipyretics PRN.    4.   Interstitial Cystitis-  Hx.  Pain Control PRN.    5.   Tobacco Use Disorder-  Counseled Re: Smoking Cessaton, Nicotine Patch while inpatient.       Code Status:   FULL CODE   Family Communication:    Significant Other at Bedside Disposition Plan:       Inpatient  Time spent:  [facility] Pager [phone]  If 7PM-7AM, please contact night-coverage [email or web address] Password [facility] 05/11/2013, 7:57 AM

## 2013-05-11 NOTE — Progress Notes (Signed)
Patient disconnected IV and left unit, wearing tele box without MD permission/order. S/o noted to be smoking in room earlier this shift- informed we are a no smoking facility. Pt informed she does not have off unit privileges and that pt is not to leave unit without permission. Pt agreeable, requesting pain medication. Will continue to monitor. x

## 2013-05-11 NOTE — Progress Notes (Addendum)
[facility] PROGRESS NOTE  Amber [name] ZOX:[id number] DOB: 07/21/1990 DOA: 05/10/2013 PCP: No PCP Per Patient  Interim history 23 year old female with history of interstitial cystitis and tobacco use initially presented to the ED on 05/09/2013 with abdominal pain that started around 05/06/2013. The patient also had associated nausea and vomiting. She was noted to have fever 102.52F. The patient was diagnosed with a UTI and sent home with cephalexin. The patient endorsed compliance. She came back to the ED on the evening of 05/10/2013 with continued abdominal pain and fevers. CT of the abdomen and pelvis revealed developing renal abscess on the right. She was admitted for intravenous antibiotics. The patient denied any illegal drug use. Assessment/Plan: Sepsis -Present at the time of admission with tachycardia and fever -Secondary to pyelonephritis/renal abscess -Continue IV fluids until patient is able to tolerate oral intake -Fever and tachycardia are improving on intravenous antibiotics Pyelonephritis/renal abscess -Discontinue metronidazole -Continue ciprofloxacin -Urine culture 05/08/2013--Escherichia coli pansensitive except to ampicillin -Case discussed with urology, Dr. Delice Bison to see patient -case discussed with IR--not amendable to drainage Tobacco abuse -Tobacco cessation discussed   Family Communication:   husband at beside--total time 60 min with >50% spent counseling patient and coordinating care Disposition Plan:   Home when medically stable    Antibiotics:  cipro 05/11/13>>>    Procedures/Studies: Dg Chest 2 View  05/09/2013   CLINICAL DATA:  Shortness of breath  EXAM: CHEST  2 VIEW  COMPARISON:  12/20/2010  FINDINGS: No edema, consolidation, effusion, or pneumothorax. Nipple shadows noted. Mild thoracic dextroscoliosis. Normal heart size.  IMPRESSION: No active cardiopulmonary disease.   Electronically Signed   By: Jorje Guild M.D.   On: 05/09/2013  01:52   Ct Abdomen Pelvis W Contrast  05/11/2013   CLINICAL DATA:  Flank pain with fever.  Pyelonephritis.  EXAM: CT ABDOMEN AND PELVIS WITH CONTRAST  TECHNIQUE: Multidetector CT imaging of the abdomen and pelvis was performed using the standard protocol following bolus administration of intravenous contrast.  CONTRAST:  146mL OMNIPAQUE IOHEXOL 300 MG/ML  SOLN  COMPARISON:  None.  FINDINGS: 24 x 20 x 26 mm peripherally enhancing area of hypoattenuation within the lateral mid pole of the right kidney with a much lower area of attenuation on its peripheral aspect. Findings consistent with developing renal abscess. Given this location there is not a clear percutaneous pathways of drainage although potentially a sample could be obtained by aspiration if needed for culture. No other similar lesions in the right or left kidney. No renal obstruction.  Negative lung bases and cardiac silhouette. Normal liver, spleen, pancreas, kidneys, adrenal glands, and gallbladder. Common bile duct upper limits normal measuring 6 mm. No intrahepatic ductal dilatation. Unremarkable vascular structures. No adnexal lesion. No free fluid or free air. No adenopathy. Normal visualized stomach, small bowel, and colon. No appendiceal inflammation. Osseous structures.  IMPRESSION: 24 x 20 x 26 mm developing right lateral midpole renal abscess.   Electronically Signed   By: Rolla Flatten M.D.   On: 05/11/2013 01:33         Subjective: Pt is feeling better.  Still has right upper quad and right flank pain--a little better.  Denies f/c, n/v/d.  No cp, sob, HA, dizziness  Objective: Filed Vitals:   05/11/13 0237 05/11/13 0258 05/11/13 0405 05/11/13 0429  BP: 103/56   108/67  Pulse: 79   91  Temp:  98.2 F (36.8 C)  98.1 F (36.7 C)  TempSrc:  Oral  Oral  Resp:  18  Height:   5\' 5"  (1.651 m)   Weight:   56.427 kg (124 lb 6.4 oz)   SpO2: 99%   97%    Intake/Output Summary (Last 24 hours) at 05/11/13 0757 Last data filed at  05/11/13 0605  Gross per 24 hour  Intake 496.67 ml  Output      0 ml  Net 496.67 ml   Weight change:  Exam:   General:  Pt is alert, follows commands appropriately, not in acute distress  HEENT: No icterus, No thrush,  [place]/AT  Cardiovascular: RRR, S1/S2, no rubs, no gallops  Respiratory: CTA bilaterally, no wheezing, no crackles, no rhonchi  Abdomen: Soft/+BS,, non distended, no guarding--RUQ/R-flank pain without rash  Extremities: No edema, No lymphangitis, No petechiae, No rashes, no synovitis  Data Reviewed: Basic Metabolic Panel:  Recent Labs Lab 05/08/13 1946 05/10/13 2005 05/11/13 0605  NA 139 141 140  K 3.3* 3.2* 3.6*  CL 99 101 101  CO2 23 27 24   GLUCOSE 165* 113* 100*  BUN 5* 4* <3*  CREATININE 0.68 0.58 0.53  CALCIUM 9.0 8.8 8.1*   Liver Function Tests:  Recent Labs Lab 05/08/13 1946 05/10/13 2005  AST 8 11  ALT 6 7  ALKPHOS 57 61  BILITOT 0.5 0.2*  PROT 7.1 6.9  ALBUMIN 3.9 3.3*   No results found for this basename: LIPASE, AMYLASE,  in the last 168 hours No results found for this basename: AMMONIA,  in the last 168 hours CBC:  Recent Labs Lab 05/08/13 1946 05/10/13 2005 05/11/13 0605  WBC 11.3* 7.5 6.2  NEUTROABS 10.3* 5.4  --   HGB 13.0 12.4 11.1*  HCT 37.5 35.2* 32.0*  MCV 96.2 94.6 94.1  PLT 225 218 197   Cardiac Enzymes: No results found for this basename: CKTOTAL, CKMB, CKMBINDEX, TROPONINI,  in the last 168 hours BNP: No components found with this basename: POCBNP,  CBG: No results found for this basename: GLUCAP,  in the last 168 hours  Recent Results (from the past 240 hour(s))  URINE CULTURE     Status: None   Collection Time    05/08/13  7:46 PM      Result Value Ref Range Status   Specimen Description URINE, CLEAN CATCH   Final   Special Requests Normal   Final   Culture  Setup Time     Final   Value: 05/09/2013 09:23     Performed at [facility]     Final   Value: >=100,000 COLONIES/ML      Performed at [facility]   Culture     Final   Value: ESCHERICHIA COLI     Performed at [facility]   Report Status 05/11/2013 FINAL   Final   Organism ID, Bacteria ESCHERICHIA COLI   Final     Scheduled Meds: . ciprofloxacin  400 mg Intravenous Q12H  . metronidazole  500 mg Intravenous Q8H   Continuous Infusions: . sodium chloride 100 mL/hr at 05/11/13 0426     , , DO  [facility] Pager [phone]  If 7PM-7AM, please contact night-coverage [email or web address] Password [email or web address] 05/11/2013, 7:57 AM   LOS: 1 day

## 2013-05-12 ENCOUNTER — Telehealth (HOSPITAL_BASED_OUTPATIENT_CLINIC_OR_DEPARTMENT_OTHER): Payer: Self-pay | Admitting: Emergency Medicine

## 2013-05-12 DIAGNOSIS — A419 Sepsis, unspecified organism: Secondary | ICD-10-CM

## 2013-05-12 LAB — BASIC METABOLIC PANEL
BUN: 3 mg/dL — AB (ref 6–23)
CHLORIDE: 97 meq/L (ref 96–112)
CO2: 24 mEq/L (ref 19–32)
Calcium: 8.4 mg/dL (ref 8.4–10.5)
Creatinine, Ser: 0.52 mg/dL (ref 0.50–1.10)
GFR calc non Af Amer: >90 mL/min (ref >90)
Glucose, Bld: 87 mg/dL (ref 70–99)
POTASSIUM: 3.1 meq/L — AB (ref 3.7–5.3)
Sodium: 137 mEq/L (ref 137–147)

## 2013-05-12 LAB — CBC
HEMATOCRIT: 33.5 % — AB (ref 36.0–46.0)
HEMOGLOBIN: 11.7 g/dL — AB (ref 12.0–15.0)
MCH: 33.1 pg (ref 26.0–34.0)
MCHC: 34.9 g/dL (ref 30.0–36.0)
MCV: 94.9 fL (ref 78.0–100.0)
Platelets: 221 10*3/uL (ref 150–400)
RBC: 3.53 MIL/uL — AB (ref 3.87–5.11)
RDW: 12.7 % (ref 11.5–15.5)
WBC: 5.4 10*3/uL (ref 4.0–10.5)

## 2013-05-12 NOTE — Progress Notes (Signed)
I've been asked to see the patient by Dr. Orson Eva, M.D. for evaluation and management of right renal abscess.  History of present illness: Is a 23 year old female with a history of UTIs who presented to the emergency department several days ago with suprapubic/abdominal pain and pain with urination. She was diagnosed with pyelonephritis discharged home on Keflex. Urine culture grew greater than 100,000 Escherichia coli resistant to ampicillin and Bactrim. The patient who presented to the emergency department approximately 24 hours later with persistent fever and worsening pain. The pain is in the patient's right flank with radiation down across to the right abdomen. She has no associated hematuria or dysuria. She does have abdominal pain with urination. In the emergency department she was found to have a temperature of 102.6. A CT scan was performed showing a persistent right pyelonephritis with a small organizing collection, could be the early formation of a renal abscess. She was admitted to the hospitalist service and started on ceftriaxone and ciprofloxacin.  Intv: Patient started on IV ciprofloxacin. States that she feels a little better this morning. She denies any significant fevers. Her pain is slightly improved. Denies any shortness of breath, fevers/chills, change in her bowel habits or significant extremity weakness.  Patient Active Problem List   Diagnosis Date Noted  . Pyelonephritis 05/11/2013  . Renal abscess 05/11/2013  . Fever, unspecified 05/11/2013  . Tobacco use disorder 05/11/2013  . Sepsis 05/11/2013  . SVD (spontaneous vaginal delivery) 07/02/2012  . UTI (urinary tract infection) during pregnancy 06/25/2012  . IC (interstitial cystitis) 04/17/2012   No current facility-administered medications on file prior to encounter.   Current Outpatient Prescriptions on File Prior to Encounter  Medication Sig Dispense Refill  . HYDROcodone-acetaminophen (NORCO/VICODIN) 5-325 MG per  tablet Take 2 tablets by mouth every 4 (four) hours as needed.  10 tablet  0  . ibuprofen (ADVIL) 800 MG tablet Take 1 tablet (800 mg total) by mouth every 8 (eight) hours as needed for moderate pain.  30 tablet  1  . naproxen (NAPROSYN) 500 MG tablet Take 1 tablet (500 mg total) by mouth 2 (two) times daily with a meal.  30 tablet  0  . ondansetron (ZOFRAN ODT) 4 MG disintegrating tablet Take 1 tablet (4 mg total) by mouth every 8 (eight) hours as needed for nausea.  10 tablet  0  . etonogestrel (IMPLANON) 68 MG IMPL implant Inject 1 each into the skin once.       Past Medical History  Diagnosis Date  . Interstitial cystitis   . Protein in urine   . Abnormal Pap smear   . Ovarian cyst   . IC (interstitial cystitis) 04/17/2012  . GBS (group B streptococcus) UTI complicating pregnancy 09/01/3149   Past Surgical History  Procedure Laterality Date  . Dilation and curettage of uterus     Family History  Problem Relation Age of Onset  . Cancer Maternal Uncle   . Other Neg Hx   . Diabetes Maternal Grandmother   . Cancer Maternal Grandmother     colon  . Diabetes Paternal Grandmother   . Cancer Paternal Grandmother     bone   History  Substance Use Topics  . Smoking status: Current Every Day Smoker -- 1.00 packs/day    Types: Cigarettes  . Smokeless tobacco: Never Used  . Alcohol Use: No     Comment: occ   PE: NAD, sitting upright in bed Filed Vitals:   05/11/13 2227 05/12/13 0536 05/12/13 0758 05/12/13  0845  BP: 124/81 120/80  124/59  Pulse: 81 73  75  Temp: 99.6 F (37.6 C) 98.6 F (37 C) 100.9 F (38.3 C) 99 F (37.2 C)  TempSrc: Oral Oral Oral Oral  Resp: 18 18  18   Height:      Weight:      SpO2: 99% 97%  99%    Intake/Output Summary (Last 24 hours) at 05/12/13 1224 Last data filed at 05/12/13 0319  Gross per 24 hour  Intake   1215 ml  Output      0 ml  Net   1215 ml  no acute distress Abdomen soft right sided abdominal tenderness to palpation, mild, and  right CVA tenderness. There is no left-sided CVA tenderness or abdominal tenderness. Extremities are symmetric bilaterally.   Recent Labs  05/10/13 2005 05/11/13 0605 05/12/13 0525  WBC 7.5 6.2 5.4  HGB 12.4 11.1* 11.7*  HCT 35.2* 32.0* 33.5*    Recent Labs  05/10/13 2005 05/11/13 0605 05/12/13 0525  NA 141 140 137  K 3.2* 3.6* 3.1*  CL 101 101 97  CO2 27 24 24   GLUCOSE 113* 100* 87  BUN 4* <3* 3*  CREATININE 0.58 0.53 0.52  CALCIUM 8.8 8.1* 8.4   No results found for this basename: LABPT, INR,  in the last 72 hours No results found for this basename: LABURIN,  in the last 72 hours Results for orders placed during the hospital encounter of 05/08/13  URINE CULTURE     Status: None   Collection Time    05/08/13  7:46 PM      Result Value Ref Range Status   Specimen Description URINE, CLEAN CATCH   Final   Special Requests Normal   Final   Culture  Setup Time     Final   Value: 05/09/2013 09:23     Performed at [place]     Final   Value: >=100,000 COLONIES/ML     Performed at [facility]   Culture     Final   Value: ESCHERICHIA COLI     Performed at [facility]   Report Status 05/11/2013 FINAL   Final   Organism ID, Bacteria ESCHERICHIA COLI   Final    CT scan:  IMPRESSION:  24 x 20 x 26 mm developing right lateral midpole renal abscess.  Imp: Pyelonephritis with developing right renal abscess Rec:  Potentially transition to by mouth antibiotics today, monitor patient for at least 24 hours. She is afebrile and his time in her symptoms continue to resolve she may be discharged home the next 48 hours. When she is discharged she will need a least a 14 day course of antibiotics. I will see her in clinic for a followup ultrasound to insure that area of concern in her right kidney has resolved. Patient will continue to have periodic fevers secondary pyelonephritis, however her fever spikes should trend down. If she's not  making any progress, she may need a repeat CT scan and a percutaneous drain placed in the renal abscess.  We'll continue to follow.

## 2013-05-12 NOTE — Progress Notes (Signed)
[facility] PROGRESS NOTE  Amber [name] IRS:[id number] DOB: Sep 15, 1990 DOA: 05/10/2013 PCP: No PCP Per Patient  Assessment/Plan: Sepsis  -Present at the time of admission with tachycardia and fever  -Secondary to pyelonephritis/renal abscess  -saline lock IVF--tolerating diet -Fever and tachycardia are improving on intravenous antibiotics  Pyelonephritis/renal abscess  -Discontinue metronidazole  -Continue ciprofloxacin  -Urine culture 05/08/2013--Escherichia coli pansensitive except to ampicillin  -Case discussed with urology, Dr. Delice Bison to see patient  -case discussed with IR--not amendable to drainage  -Overall fever were trending down and clinically improving Tobacco abuse  -Tobacco cessation discussed   Family Communication:   Spouse at beside Disposition Plan:   Home when medically stable  Antibiotics:  Ciprofloxacin 05/11/2013>>>    Procedures/Studies: Dg Chest 2 View  05/09/2013   CLINICAL DATA:  Shortness of breath  EXAM: CHEST  2 VIEW  COMPARISON:  12/20/2010  FINDINGS: No edema, consolidation, effusion, or pneumothorax. Nipple shadows noted. Mild thoracic dextroscoliosis. Normal heart size.  IMPRESSION: No active cardiopulmonary disease.   Electronically Signed   By: Jorje Guild M.D.   On: 05/09/2013 01:52   Ct Abdomen Pelvis W Contrast  05/11/2013   CLINICAL DATA:  Flank pain with fever.  Pyelonephritis.  EXAM: CT ABDOMEN AND PELVIS WITH CONTRAST  TECHNIQUE: Multidetector CT imaging of the abdomen and pelvis was performed using the standard protocol following bolus administration of intravenous contrast.  CONTRAST:  111mL OMNIPAQUE IOHEXOL 300 MG/ML  SOLN  COMPARISON:  None.  FINDINGS: 24 x 20 x 26 mm peripherally enhancing area of hypoattenuation within the lateral mid pole of the right kidney with a much lower area of attenuation on its peripheral aspect. Findings consistent with developing renal abscess. Given this location there is not a clear  percutaneous pathways of drainage although potentially a sample could be obtained by aspiration if needed for culture. No other similar lesions in the right or left kidney. No renal obstruction.  Negative lung bases and cardiac silhouette. Normal liver, spleen, pancreas, kidneys, adrenal glands, and gallbladder. Common bile duct upper limits normal measuring 6 mm. No intrahepatic ductal dilatation. Unremarkable vascular structures. No adnexal lesion. No free fluid or free air. No adenopathy. Normal visualized stomach, small bowel, and colon. No appendiceal inflammation. Osseous structures.  IMPRESSION: 24 x 20 x 26 mm developing right lateral midpole renal abscess.   Electronically Signed   By: Rolla Flatten M.D.   On: 05/11/2013 01:33         Subjective: Patient still has low-grade temperatures intermittently. Denies any nausea, vomiting, diarrhea, chest discomfort, shortness breath, dizziness, headache. Right flank pain is gradually improving.  Objective: Filed Vitals:   05/11/13 2227 05/12/13 0536 05/12/13 0758 05/12/13 0845  BP: 124/81 120/80  124/59  Pulse: 81 73  75  Temp: 99.6 F (37.6 C) 98.6 F (37 C) 100.9 F (38.3 C) 99 F (37.2 C)  TempSrc: Oral Oral Oral Oral  Resp: 18 18  18   Height:      Weight:      SpO2: 99% 97%  99%    Intake/Output Summary (Last 24 hours) at 05/12/13 1453 Last data filed at 05/12/13 0319  Gross per 24 hour  Intake    540 ml  Output      0 ml  Net    540 ml   Weight change:  Exam:   General:  Pt is alert, follows commands appropriately, not in acute distress  HEENT: No icterus, No thrush,[place]/AT  Cardiovascular: RRR, S1/S2, no rubs,  no gallops  Respiratory: CTA bilaterally, no wheezing, no crackles, no rhonchi  Abdomen: Soft/+BS, R-flank tender without rash, non distended, no guarding  Extremities: No edema, No lymphangitis, No petechiae, No rashes, no synovitis  Data Reviewed: Basic Metabolic Panel:  Recent Labs Lab  05/08/13 1946 05/10/13 2005 05/11/13 0605 05/12/13 0525  NA 139 141 140 137  K 3.3* 3.2* 3.6* 3.1*  CL 99 101 101 97  CO2 23 27 24 24   GLUCOSE 165* 113* 100* 87  BUN 5* 4* <3* 3*  CREATININE 0.68 0.58 0.53 0.52  CALCIUM 9.0 8.8 8.1* 8.4   Liver Function Tests:  Recent Labs Lab 05/08/13 1946 05/10/13 2005  AST 8 11  ALT 6 7  ALKPHOS 57 61  BILITOT 0.5 0.2*  PROT 7.1 6.9  ALBUMIN 3.9 3.3*   No results found for this basename: LIPASE, AMYLASE,  in the last 168 hours No results found for this basename: AMMONIA,  in the last 168 hours CBC:  Recent Labs Lab 05/08/13 1946 05/10/13 2005 05/11/13 0605 05/12/13 0525  WBC 11.3* 7.5 6.2 5.4  NEUTROABS 10.3* 5.4  --   --   HGB 13.0 12.4 11.1* 11.7*  HCT 37.5 35.2* 32.0* 33.5*  MCV 96.2 94.6 94.1 94.9  PLT 225 218 197 221   Cardiac Enzymes: No results found for this basename: CKTOTAL, CKMB, CKMBINDEX, TROPONINI,  in the last 168 hours BNP: No components found with this basename: POCBNP,  CBG: No results found for this basename: GLUCAP,  in the last 168 hours  Recent Results (from the past 240 hour(s))  URINE CULTURE     Status: None   Collection Time    05/08/13  7:46 PM      Result Value Ref Range Status   Specimen Description URINE, CLEAN CATCH   Final   Special Requests Normal   Final   Culture  Setup Time     Final   Value: 05/09/2013 09:23     Performed at [place]     Final   Value: >=100,000 COLONIES/ML     Performed at [facility]   Culture     Final   Value: ESCHERICHIA COLI     Performed at [facility]   Report Status 05/11/2013 FINAL   Final   Organism ID, Bacteria ESCHERICHIA COLI   Final     Scheduled Meds: . ciprofloxacin  400 mg Intravenous Q12H   Continuous Infusions: . sodium chloride 100 mL/hr at 05/12/13 0319     , , DO  [facility] Pager [phone]  If 7PM-7AM, please contact night-coverage [email or web address] Password  [email or web address] 05/12/2013, 2:53 PM   LOS: 2 days

## 2013-05-12 NOTE — Progress Notes (Signed)
Pt sitting up in bed. A&Ox4, VSS. Pain controlled with medication.

## 2013-05-12 NOTE — Progress Notes (Signed)
Pt walked off unit without notifying staff. Andy RN spotted pt smoking outside of short stay entrance. After 10 minutes of being off the floor, patient returned to room. Rogue Bussing NP notified. Velora Mediate

## 2013-05-12 NOTE — Telephone Encounter (Signed)
Post ED Visit - Positive Culture Follow-up  Culture report reviewed by antimicrobial stewardship pharmacist: []  Wes Dulaney, Pharm.D., BCPS [x]  Heide Guile, Pharm.D., BCPS []  Alycia Rossetti, Pharm.D., BCPS []  [place], Pharm.D., BCPS, AAHIVP []  Legrand Como, Pharm.D., BCPS, AAHIVP  Positive urine culture Treated with Keflex, organism sensitive to the same and no further patient follow-up is required at this time.  [place], Rex Kras 05/12/2013, 12:01 PM
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
## 2013-05-13 MED ORDER — POTASSIUM CHLORIDE CRYS ER 20 MEQ PO TBCR
40.0000 meq | EXTENDED_RELEASE_TABLET | Freq: Once | ORAL | Status: AC
  Administered 2013-05-13: 40 meq via ORAL
  Filled 2013-05-13: qty 2

## 2013-05-13 MED ORDER — CYCLOBENZAPRINE HCL 5 MG PO TABS
5.0000 mg | ORAL_TABLET | Freq: Every day | ORAL | Status: DC
  Administered 2013-05-13: 5 mg via ORAL
  Filled 2013-05-13 (×2): qty 1

## 2013-05-13 MED ORDER — CIPROFLOXACIN HCL 500 MG PO TABS
500.0000 mg | ORAL_TABLET | Freq: Two times a day (BID) | ORAL | Status: DC
  Administered 2013-05-13 – 2013-05-14 (×2): 500 mg via ORAL
  Filled 2013-05-13 (×4): qty 1

## 2013-05-13 MED ORDER — CYCLOBENZAPRINE HCL 10 MG PO TABS
5.0000 mg | ORAL_TABLET | Freq: Three times a day (TID) | ORAL | Status: DC | PRN
  Administered 2013-05-13: 5 mg via ORAL
  Filled 2013-05-13: qty 1

## 2013-05-13 NOTE — Progress Notes (Signed)
[facility] PROGRESS NOTE  Amber [name] WUX:[id number] DOB: 1990/10/13 DOA: 05/10/2013 PCP: No PCP Per Patient  Assessment/Plan: Sepsis  -Present at the time of admission with tachycardia and fever  -Secondary to pyelonephritis/renal abscess  -saline lock IVF--tolerating diet  -Fever and tachycardia are improving on intravenous antibiotics  Pyelonephritis/renal abscess  -Discontinue metronidazole  -Continue ciprofloxacin-->change to po -Urine culture 05/08/2013--Escherichia coli pansensitive except to ampicillin  -Case discussed with urology, Dr. Delice Bison to see patient  -case discussed with IR--not amendable to drainage  -Overall fever were trending down and clinically improving  Tobacco abuse  -Tobacco cessation discussed  Uncontrolled pain -add flexeril Family Communication: Spouse at beside  Disposition Plan: Home 05/14/13 if stable Antibiotics:  Ciprofloxacin 05/11/2013>>>          Procedures/Studies: Dg Chest 2 View  05/09/2013   CLINICAL DATA:  Shortness of breath  EXAM: CHEST  2 VIEW  COMPARISON:  12/20/2010  FINDINGS: No edema, consolidation, effusion, or pneumothorax. Nipple shadows noted. Mild thoracic dextroscoliosis. Normal heart size.  IMPRESSION: No active cardiopulmonary disease.   Electronically Signed   By: Jorje Guild M.D.   On: 05/09/2013 01:52   Ct Abdomen Pelvis W Contrast  05/11/2013   CLINICAL DATA:  Flank pain with fever.  Pyelonephritis.  EXAM: CT ABDOMEN AND PELVIS WITH CONTRAST  TECHNIQUE: Multidetector CT imaging of the abdomen and pelvis was performed using the standard protocol following bolus administration of intravenous contrast.  CONTRAST:  144mL OMNIPAQUE IOHEXOL 300 MG/ML  SOLN  COMPARISON:  None.  FINDINGS: 24 x 20 x 26 mm peripherally enhancing area of hypoattenuation within the lateral mid pole of the right kidney with a much lower area of attenuation on its peripheral aspect. Findings consistent with developing renal  abscess. Given this location there is not a clear percutaneous pathways of drainage although potentially a sample could be obtained by aspiration if needed for culture. No other similar lesions in the right or left kidney. No renal obstruction.  Negative lung bases and cardiac silhouette. Normal liver, spleen, pancreas, kidneys, adrenal glands, and gallbladder. Common bile duct upper limits normal measuring 6 mm. No intrahepatic ductal dilatation. Unremarkable vascular structures. No adnexal lesion. No free fluid or free air. No adenopathy. Normal visualized stomach, small bowel, and colon. No appendiceal inflammation. Osseous structures.  IMPRESSION: 24 x 20 x 26 mm developing right lateral midpole renal abscess.   Electronically Signed   By: Rolla Flatten M.D.   On: 05/11/2013 01:33         Subjective: Patient denies fevers, chills, chest pain, shortness breath, nausea, vomiting, diarrhea. She continues to have right flank pain. No dysuria or hematuria. No headache or dizziness.  Objective: Filed Vitals:   05/13/13 0552 05/13/13 0822 05/13/13 1147 05/13/13 1702  BP: 113/74 118/69 122/65 142/83  Pulse: 65 69 89 59  Temp: 98.6 F (37 C) 99.9 F (37.7 C) 99.1 F (37.3 C) 98.7 F (37.1 C)  TempSrc: Oral Oral Oral Oral  Resp: 18 18 18 18   Height:      Weight:      SpO2: 98% 98% 100% 100%    Intake/Output Summary (Last 24 hours) at 05/13/13 1750 Last data filed at 05/13/13 1500  Gross per 24 hour  Intake   3200 ml  Output      2 ml  Net   3198 ml   Weight change:  Exam:   General:  Pt is alert, follows commands appropriately, not in acute distress  HEENT: No  icterus, No thrush,  [place]/AT  Cardiovascular: RRR, S1/S2, no rubs, no gallops  Respiratory: CTA bilaterally, no wheezing, no crackles, no rhonchi  Abdomen: Soft/+BS, non tender, non distended, no guarding; right flank/posterior ribs with muscular hypertonicity  Extremities: No edema, No lymphangitis, No petechiae, No  rashes, no synovitis  Data Reviewed: Basic Metabolic Panel:  Recent Labs Lab 05/08/13 1946 05/10/13 2005 05/11/13 0605 05/12/13 0525  NA 139 141 140 137  K 3.3* 3.2* 3.6* 3.1*  CL 99 101 101 97  CO2 23 27 24 24   GLUCOSE 165* 113* 100* 87  BUN 5* 4* <3* 3*  CREATININE 0.68 0.58 0.53 0.52  CALCIUM 9.0 8.8 8.1* 8.4   Liver Function Tests:  Recent Labs Lab 05/08/13 1946 05/10/13 2005  AST 8 11  ALT 6 7  ALKPHOS 57 61  BILITOT 0.5 0.2*  PROT 7.1 6.9  ALBUMIN 3.9 3.3*   No results found for this basename: LIPASE, AMYLASE,  in the last 168 hours No results found for this basename: AMMONIA,  in the last 168 hours CBC:  Recent Labs Lab 05/08/13 1946 05/10/13 2005 05/11/13 0605 05/12/13 0525  WBC 11.3* 7.5 6.2 5.4  NEUTROABS 10.3* 5.4  --   --   HGB 13.0 12.4 11.1* 11.7*  HCT 37.5 35.2* 32.0* 33.5*  MCV 96.2 94.6 94.1 94.9  PLT 225 218 197 221   Cardiac Enzymes: No results found for this basename: CKTOTAL, CKMB, CKMBINDEX, TROPONINI,  in the last 168 hours BNP: No components found with this basename: POCBNP,  CBG: No results found for this basename: GLUCAP,  in the last 168 hours  Recent Results (from the past 240 hour(s))  URINE CULTURE     Status: None   Collection Time    05/08/13  7:46 PM      Result Value Ref Range Status   Specimen Description URINE, CLEAN CATCH   Final   Special Requests Normal   Final   Culture  Setup Time     Final   Value: 05/09/2013 09:23     Performed at [place]     Final   Value: >=100,000 COLONIES/ML     Performed at [facility]   Culture     Final   Value: ESCHERICHIA COLI     Performed at [facility]   Report Status 05/11/2013 FINAL   Final   Organism ID, Bacteria ESCHERICHIA COLI   Final  CULTURE, BLOOD (ROUTINE X 2)     Status: None   Collection Time    05/12/13  9:00 AM      Result Value Ref Range Status   Specimen Description BLOOD RIGHT ARM   Final   Special  Requests BOTTLES DRAWN AEROBIC ONLY 5CC   Final   Culture  Setup Time     Final   Value: 05/12/2013 18:59     Performed at [facility]   Culture     Final   Value:        BLOOD CULTURE RECEIVED NO GROWTH TO DATE CULTURE WILL BE HELD FOR 5 DAYS BEFORE ISSUING A FINAL NEGATIVE REPORT     Performed at [facility]   Report Status PENDING   Incomplete  CULTURE, BLOOD (ROUTINE X 2)     Status: None   Collection Time    05/12/13  9:07 AM      Result Value Ref Range Status   Specimen Description BLOOD LEFT HAND  Final   Special Requests BOTTLES DRAWN AEROBIC ONLY 5CC   Final   Culture  Setup Time     Final   Value: 05/12/2013 18:58     Performed at [facility]   Culture     Final   Value:        BLOOD CULTURE RECEIVED NO GROWTH TO DATE CULTURE WILL BE HELD FOR 5 DAYS BEFORE ISSUING A FINAL NEGATIVE REPORT     Performed at [facility]   Report Status PENDING   Incomplete     Scheduled Meds: . ciprofloxacin  500 mg Oral BID  . potassium chloride  40 mEq Oral Once   Continuous Infusions: . sodium chloride 100 mL/hr at 05/13/13 0541     , , DO  [facility] Pager [phone]  If 7PM-7AM, please contact night-coverage [email or web address] Password [email or web address] 05/13/2013, 5:50 PM   LOS: 3 days

## 2013-05-14 LAB — BASIC METABOLIC PANEL
BUN: 5 mg/dL — AB (ref 6–23)
CHLORIDE: 105 meq/L (ref 96–112)
CO2: 26 meq/L (ref 19–32)
CREATININE: 0.61 mg/dL (ref 0.50–1.10)
Calcium: 8.7 mg/dL (ref 8.4–10.5)
GFR calc Af Amer: >90 mL/min (ref >90)
GFR calc non Af Amer: >90 mL/min (ref >90)
GLUCOSE: 99 mg/dL (ref 70–99)
Potassium: 3.8 mEq/L (ref 3.7–5.3)
Sodium: 141 mEq/L (ref 137–147)

## 2013-05-14 LAB — MAGNESIUM: Magnesium: 1.9 mg/dL (ref 1.5–2.5)

## 2013-05-14 MED ORDER — CIPROFLOXACIN HCL 500 MG PO TABS: 500.0000 mg | ORAL_TABLET | Freq: Two times a day (BID) | ORAL | Status: DC

## 2013-05-14 MED ORDER — CYCLOBENZAPRINE HCL 5 MG PO TABS: 5.0000 mg | ORAL_TABLET | Freq: Three times a day (TID) | ORAL | Status: DC | PRN

## 2013-05-14 MED ORDER — OXYCODONE HCL 5 MG PO TABS: 5.0000 mg | ORAL_TABLET | ORAL | Status: DC | PRN

## 2013-05-14 NOTE — Progress Notes (Signed)
I've been asked to see the patient by Dr. Orson Eva, M.D. for evaluation and management of right renal abscess.  History of present illness: Is a 23 year old female with a history of UTIs who presented to the emergency department several days ago with suprapubic/abdominal pain and pain with urination. She was diagnosed with pyelonephritis discharged home on Keflex. Urine culture grew greater than 100,000 Escherichia coli resistant to ampicillin and Bactrim. The patient who presented to the emergency department approximately 24 hours later with persistent fever and worsening pain. The pain is in the patient's right flank with radiation down across to the right abdomen. She has no associated hematuria or dysuria. She does have abdominal pain with urination. In the emergency department she was found to have a temperature of 102.6. A CT scan was performed showing a persistent right pyelonephritis with a small organizing collection, could be the early formation of a renal abscess. She was admitted to the hospitalist service and started on ceftriaxone and ciprofloxacin.  Intv: No fevers.  Back pain is improving.  Started on PO cipro yesterday.  Denies any shortness of breath, fevers/chills, change in her bowel habits or significant extremity weakness.  Patient Active Problem List   Diagnosis Date Noted  . Pyelonephritis 05/11/2013  . Renal abscess 05/11/2013  . Fever, unspecified 05/11/2013  . Tobacco use disorder 05/11/2013  . Sepsis 05/11/2013  . SVD (spontaneous vaginal delivery) 07/02/2012  . UTI (urinary tract infection) during pregnancy 06/25/2012  . IC (interstitial cystitis) 04/17/2012   No current facility-administered medications on file prior to encounter.   Current Outpatient Prescriptions on File Prior to Encounter  Medication Sig Dispense Refill  . HYDROcodone-acetaminophen (NORCO/VICODIN) 5-325 MG per tablet Take 2 tablets by mouth every 4 (four) hours as needed.  10 tablet  0  .  ibuprofen (ADVIL) 800 MG tablet Take 1 tablet (800 mg total) by mouth every 8 (eight) hours as needed for moderate pain.  30 tablet  1  . naproxen (NAPROSYN) 500 MG tablet Take 1 tablet (500 mg total) by mouth 2 (two) times daily with a meal.  30 tablet  0  . ondansetron (ZOFRAN ODT) 4 MG disintegrating tablet Take 1 tablet (4 mg total) by mouth every 8 (eight) hours as needed for nausea.  10 tablet  0  . etonogestrel (IMPLANON) 68 MG IMPL implant Inject 1 each into the skin once.       Past Medical History  Diagnosis Date  . Interstitial cystitis   . Protein in urine   . Abnormal Pap smear   . Ovarian cyst   . IC (interstitial cystitis) 04/17/2012  . GBS (group B streptococcus) UTI complicating pregnancy 11/07/4095   Past Surgical History  Procedure Laterality Date  . Dilation and curettage of uterus     Family History  Problem Relation Age of Onset  . Cancer Maternal Uncle   . Other Neg Hx   . Diabetes Maternal Grandmother   . Cancer Maternal Grandmother     colon  . Diabetes Paternal Grandmother   . Cancer Paternal Grandmother     bone   History  Substance Use Topics  . Smoking status: Current Every Day Smoker -- 1.00 packs/day    Types: Cigarettes  . Smokeless tobacco: Never Used  . Alcohol Use: No     Comment: occ   PE: NAD, sitting upright in bed Filed Vitals:   05/13/13 0822 05/13/13 1147 05/13/13 1702 05/13/13 2100  BP: 118/69 122/65 142/83 108/65  Pulse: 69  89 59 58  Temp: 99.9 F (37.7 C) 99.1 F (37.3 C) 98.7 F (37.1 C) 98.4 F (36.9 C)  TempSrc: Oral Oral Oral Oral  Resp: 18 18 18 16   Height:      Weight:      SpO2: 98% 100% 100% 99%    Intake/Output Summary (Last 24 hours) at 05/14/13 0741 Last data filed at 05/13/13 1815  Gross per 24 hour  Intake   1995 ml  Output      0 ml  Net   1995 ml  no acute distress Abdomen soft right sided abdominal tenderness to palpation, mild, and right CVA tenderness. There is no left-sided CVA tenderness or  abdominal tenderness. Extremities are symmetric bilaterally.   Recent Labs  05/12/13 0525  WBC 5.4  HGB 11.7*  HCT 33.5*    Recent Labs  05/12/13 0525 05/14/13 0456  NA 137 141  K 3.1* 3.8  CL 97 105  CO2 24 26  GLUCOSE 87 99  BUN 3* 5*  CREATININE 0.52 0.61  CALCIUM 8.4 8.7   No results found for this basename: LABPT, INR,  in the last 72 hours No results found for this basename: LABURIN,  in the last 72 hours Results for orders placed during the hospital encounter of 05/10/13  CULTURE, BLOOD (ROUTINE X 2)     Status: None   Collection Time    05/12/13  9:00 AM      Result Value Ref Range Status   Specimen Description BLOOD RIGHT ARM   Final   Special Requests BOTTLES DRAWN AEROBIC ONLY 5CC   Final   Culture  Setup Time     Final   Value: 05/12/2013 18:59     Performed at [facility]   Culture     Final   Value:        BLOOD CULTURE RECEIVED NO GROWTH TO DATE CULTURE WILL BE HELD FOR 5 DAYS BEFORE ISSUING A FINAL NEGATIVE REPORT     Performed at [facility]   Report Status PENDING   Incomplete  CULTURE, BLOOD (ROUTINE X 2)     Status: None   Collection Time    05/12/13  9:07 AM      Result Value Ref Range Status   Specimen Description BLOOD LEFT HAND   Final   Special Requests BOTTLES DRAWN AEROBIC ONLY 5CC   Final   Culture  Setup Time     Final   Value: 05/12/2013 18:58     Performed at [facility]   Culture     Final   Value:        BLOOD CULTURE RECEIVED NO GROWTH TO DATE CULTURE WILL BE HELD FOR 5 DAYS BEFORE ISSUING A FINAL NEGATIVE REPORT     Performed at [facility]   Report Status PENDING   Incomplete    CT scan:  IMPRESSION:  24 x 20 x 26 mm developing right lateral midpole renal abscess.  Imp: Nephritis with developing right renal abscess - improving with conservative management. Rec:  reculture urine today to ensure that infection has cleared ( i have ordered) Abx x 2 weeks I will plan to see her in the  office in 10-14 days with a renal ultrasound prior.   We'll continue to follow.

## 2013-05-14 NOTE — Progress Notes (Signed)
Discharge instructions reviewed with patient and significant others. Hard copy of prescriptions and [place] letter given to patient. PIV removed. All belongings returned. Patient to be discharged to home.  Aaron Edelman,  [place]

## 2013-05-14 NOTE — Care Management Note (Signed)
   CARE MANAGEMENT NOTE 05/14/2013  Patient:  Amber [name], Amber [name]   Account Number:  [id number]  Date Initiated:  05/13/2013  Documentation initiated by:  ,  Subjective/Objective Assessment:   Case Management referral for meds.     Action/Plan:   Noted that pt has [place] Medicaid, therefore has minimal copay for meds. Will follow for any meds that may not be covered by Medicaid.  05/14/13 Pt medicaid no longer in effect. Match letter and info on [employer] [facility] given.   Anticipated DC Date:  05/14/2013   Anticipated DC Plan:  [facility]      Choice offered to / List presented to:             Status of service:  Completed, signed off Medicare Important Message given?   (If response is "NO", the following Medicare IM given date fields will be blank) Date Medicare IM given:   Date Additional Medicare IM given:    Discharge Disposition:  HOME/SELF CARE  Per UR Regulation:    If discussed at Long Length of Stay Meetings, dates discussed:    Comments:  05/14/13 Pt registration changed today to reflect no insurance coverage. Pt states that she had Medicaid only during her pregnancy. Pt was given [place] letter to assist with meds and given info on [facility] to arrange an appointment. Jasmine Pang RN MPH, case manager, [phone]

## 2013-05-14 NOTE — Discharge Summary (Signed)
Physician Discharge Summary  Amber [name] ZPH:[id number] DOB: 27-Apr-1990 DOA: 05/10/2013  PCP: No PCP Per Patient  Admit date: 05/10/2013 Discharge date: 05/14/2013  Recommendations for Outpatient Follow-up:  1. Pt will need to follow up with PCP in 2 weeks post discharge 2. Please obtain BMP to evaluate electrolytes and kidney function 3. Please also check CBC to evaluate Hg and Hct levels   Discharge Diagnoses:  Sepsis  -Present at the time of admission with tachycardia and fever  -Secondary to pyelonephritis/renal abscess  -saline lock IVF--tolerating diet  -Fever and tachycardia are improving on intravenous antibiotics -Patient was transitioned to oral antibiotics without difficulty. She remained afebrile and hemodynamic is stable.  Pyelonephritis/renal abscess  -Discontinue metronidazole  -Continue ciprofloxacin-->change to po  -Urine culture 05/08/2013--Escherichia coli pansensitive except to ampicillin  -Case discussed with urology, Dr. Delice Bison to see patient  -case discussed with IR--not amendable to drainage  -Overall fever were trending down and clinically improving  -Blood cultures are negative at 48 hours -The patient will continue on ciprofloxacin for 11 more days which will complete 14 days of therapy. -She will follow up with urology, Dr. Louis Meckel. Tobacco abuse  -Tobacco cessation discussed  Uncontrolled pain  -add flexeril  -Home with oxycodone 5 mg every 6 hours when necessary pain, #30, no refills Family Communication: Spouse at beside   Antibiotics:  Ciprofloxacin 05/11/2013>>>   Discharge Condition: Stable  Disposition:  Follow-up Information   Follow up with Ardis Hughs, MD In 2 weeks.   Specialty:  Urology   Contact information:   [facility]  [place] [address] [phone]      home  Diet: Regular Wt Readings from Last 3 Encounters:  05/11/13 56.427 kg (124 lb 6.4 oz)  02/22/13  57.777 kg (127 lb 6 oz)  12/29/12 55.877 kg (123 lb 3 oz)    History of present illness:  23 year old female presents with four-day history of right flank pain abdominal pain. The patient presented to ED 2 days prior to admission. She was started on cephalexin for UTI/polynephritis. Her symptoms do not remit. She continued to worsen. She had fevers up to 102.49F. The patient will get back to the ED. CT of the abdomen and pelvis revealed hypoattenuating lesion in the right kidney suggesting developing renal abscess. The patient was started on intravenous antibiotics. Blood cultures were obtained. There are negative to date. Previous urine cultures on 05/08/2013 showed Escherichia coli. Urology was consulted. They did not feel that the lesion was amendable to drainage. The patient was treated medically. She defervescence. She remained hemodynamically stable. The patient was discharged on oral antibiotics for a 14 day course. She will follow up with urology in the outpatient setting.  Consultants: Urology  Discharge Exam: Filed Vitals:   05/14/13 0815  BP: 119/77  Pulse: 87  Temp: 99 F (37.2 C)  Resp: 18   Filed Vitals:   05/13/13 1147 05/13/13 1702 05/13/13 2100 05/14/13 0815  BP: 122/65 142/83 108/65 119/77  Pulse: 89 59 58 87  Temp: 99.1 F (37.3 C) 98.7 F (37.1 C) 98.4 F (36.9 C) 99 F (37.2 C)  TempSrc: Oral Oral Oral Oral  Resp: 18 18 16 18   Height:      Weight:      SpO2: 100% 100% 99% 99%   General: A&O x 3, NAD, pleasant, cooperative Cardiovascular: RRR, no rub, no gallop, no S3 Respiratory: CTAB, no wheeze, no rhonchi Abdomen: Right flank pain. No rashes noted.  Right upper quadrant tenderness without any rebound or peritoneal signs there Extremities: No edema, No lymphangitis, no petechiae  Discharge Instructions      Discharge Orders   Future Orders Complete By Expires   Diet - low sodium heart healthy  As directed    Increase activity slowly  As directed         Medication List    STOP taking these medications       cephALEXin 500 MG capsule  Commonly known as:  KEFLEX     HYDROcodone-acetaminophen 5-325 MG per tablet  Commonly known as:  NORCO/VICODIN     ibuprofen 800 MG tablet  Commonly known as:  ADVIL     naproxen 500 MG tablet  Commonly known as:  NAPROSYN      TAKE these medications       acetaminophen 500 MG tablet  Commonly known as:  TYLENOL  Take 500 mg by mouth every 6 (six) hours as needed.     ciprofloxacin 500 MG tablet  Commonly known as:  CIPRO  Take 1 tablet (500 mg total) by mouth 2 (two) times daily.     cyclobenzaprine 5 MG tablet  Commonly known as:  FLEXERIL  Take 1 tablet (5 mg total) by mouth 3 (three) times daily as needed for muscle spasms.     etonogestrel 68 MG Impl implant  Commonly known as:  IMPLANON  Inject 1 each into the skin once.     ondansetron 4 MG disintegrating tablet  Commonly known as:  ZOFRAN ODT  Take 1 tablet (4 mg total) by mouth every 8 (eight) hours as needed for nausea.     oxyCODONE 5 MG immediate release tablet  Commonly known as:  Oxy IR/ROXICODONE  Take 1 tablet (5 mg total) by mouth every 4 (four) hours as needed for moderate pain.         The results of significant diagnostics from this hospitalization (including imaging, microbiology, ancillary and laboratory) are listed below for reference.    Significant Diagnostic Studies: Dg Chest 2 View  05/09/2013   CLINICAL DATA:  Shortness of breath  EXAM: CHEST  2 VIEW  COMPARISON:  12/20/2010  FINDINGS: No edema, consolidation, effusion, or pneumothorax. Nipple shadows noted. Mild thoracic dextroscoliosis. Normal heart size.  IMPRESSION: No active cardiopulmonary disease.   Electronically Signed   By: Jorje Guild M.D.   On: 05/09/2013 01:52   Ct Abdomen Pelvis W Contrast  05/11/2013   CLINICAL DATA:  Flank pain with fever.  Pyelonephritis.  EXAM: CT ABDOMEN AND PELVIS WITH CONTRAST  TECHNIQUE: Multidetector CT  imaging of the abdomen and pelvis was performed using the standard protocol following bolus administration of intravenous contrast.  CONTRAST:  173mL OMNIPAQUE IOHEXOL 300 MG/ML  SOLN  COMPARISON:  None.  FINDINGS: 24 x 20 x 26 mm peripherally enhancing area of hypoattenuation within the lateral mid pole of the right kidney with a much lower area of attenuation on its peripheral aspect. Findings consistent with developing renal abscess. Given this location there is not a clear percutaneous pathways of drainage although potentially a sample could be obtained by aspiration if needed for culture. No other similar lesions in the right or left kidney. No renal obstruction.  Negative lung bases and cardiac silhouette. Normal liver, spleen, pancreas, kidneys, adrenal glands, and gallbladder. Common bile duct upper limits normal measuring 6 mm. No intrahepatic ductal dilatation. Unremarkable vascular structures. No adnexal lesion. No free fluid or free air. No adenopathy. Normal visualized  stomach, small bowel, and colon. No appendiceal inflammation. Osseous structures.  IMPRESSION: 24 x 20 x 26 mm developing right lateral midpole renal abscess.   Electronically Signed   By: Rolla Flatten M.D.   On: 05/11/2013 01:33     Microbiology: Recent Results (from the past 240 hour(s))  URINE CULTURE     Status: None   Collection Time    05/08/13  7:46 PM      Result Value Ref Range Status   Specimen Description URINE, CLEAN CATCH   Final   Special Requests Normal   Final   Culture  Setup Time     Final   Value: 05/09/2013 09:23     Performed at [facility] Count     Final   Value: >=100,000 COLONIES/ML     Performed at [facility]   Culture     Final   Value: ESCHERICHIA COLI     Performed at [facility]   Report Status 05/11/2013 FINAL   Final   Organism ID, Bacteria ESCHERICHIA COLI   Final  CULTURE, BLOOD (ROUTINE X 2)     Status: None   Collection Time    05/12/13  9:00  AM      Result Value Ref Range Status   Specimen Description BLOOD RIGHT ARM   Final   Special Requests BOTTLES DRAWN AEROBIC ONLY 5CC   Final   Culture  Setup Time     Final   Value: 05/12/2013 18:59     Performed at [facility]   Culture     Final   Value:        BLOOD CULTURE RECEIVED NO GROWTH TO DATE CULTURE WILL BE HELD FOR 5 DAYS BEFORE ISSUING A FINAL NEGATIVE REPORT     Performed at [facility]   Report Status PENDING   Incomplete  CULTURE, BLOOD (ROUTINE X 2)     Status: None   Collection Time    05/12/13  9:07 AM      Result Value Ref Range Status   Specimen Description BLOOD LEFT HAND   Final   Special Requests BOTTLES DRAWN AEROBIC ONLY 5CC   Final   Culture  Setup Time     Final   Value: 05/12/2013 18:58     Performed at [facility]   Culture     Final   Value:        BLOOD CULTURE RECEIVED NO GROWTH TO DATE CULTURE WILL BE HELD FOR 5 DAYS BEFORE ISSUING A FINAL NEGATIVE REPORT     Performed at [facility]   Report Status PENDING   Incomplete     Labs: Basic Metabolic Panel:  Recent Labs Lab 05/08/13 1946 05/10/13 2005 05/11/13 0605 05/12/13 0525 05/14/13 0456  NA 139 141 140 137 141  K 3.3* 3.2* 3.6* 3.1* 3.8  CL 99 101 101 97 105  CO2 23 27 24 24 26   GLUCOSE 165* 113* 100* 87 99  BUN 5* 4* <3* 3* 5*  CREATININE 0.68 0.58 0.53 0.52 0.61  CALCIUM 9.0 8.8 8.1* 8.4 8.7  MG  --   --   --   --  1.9   Liver Function Tests:  Recent Labs Lab 05/08/13 1946 05/10/13 2005  AST 8 11  ALT 6 7  ALKPHOS 57 61  BILITOT 0.5 0.2*  PROT 7.1 6.9  ALBUMIN 3.9 3.3*   No results found for this basename: LIPASE, AMYLASE,  in the last 168 hours No results found for this basename: AMMONIA,  in the last 168 hours CBC:  Recent Labs Lab 05/08/13 1946 05/10/13 2005 05/11/13 0605 05/12/13 0525  WBC 11.3* 7.5 6.2 5.4  NEUTROABS 10.3* 5.4  --   --   HGB 13.0 12.4 11.1* 11.7*  HCT 37.5 35.2* 32.0* 33.5*  MCV 96.2 94.6 94.1 94.9   PLT 225 218 197 221   Cardiac Enzymes: No results found for this basename: CKTOTAL, CKMB, CKMBINDEX, TROPONINI,  in the last 168 hours BNP: No components found with this basename: POCBNP,  CBG: No results found for this basename: GLUCAP,  in the last 168 hours  Time coordinating discharge:  Greater than 30 minutes  Signed:  , , DO [facility] Pager: [phone] 05/14/2013, 9:05 AM

## 2013-05-15 LAB — URINE CULTURE
CULTURE: NO GROWTH
Colony Count: NO GROWTH

## 2013-05-18 LAB — CULTURE, BLOOD (ROUTINE X 2)
Culture: NO GROWTH
Culture: NO GROWTH

## 2013-05-22 ENCOUNTER — Encounter (HOSPITAL_COMMUNITY): Payer: Self-pay | Admitting: Emergency Medicine

## 2013-05-22 ENCOUNTER — Emergency Department (HOSPITAL_COMMUNITY)
Admission: EM | Admit: 2013-05-22 | Discharge: 2013-05-22 | Disposition: A | Discharge status: Home / Self Care | Payer: Medicaid Other | Attending: Emergency Medicine | Admitting: Emergency Medicine

## 2013-05-22 DIAGNOSIS — Z8719 Personal history of other diseases of the digestive system: Secondary | ICD-10-CM | POA: Insufficient documentation

## 2013-05-22 DIAGNOSIS — R109 Unspecified abdominal pain: Secondary | ICD-10-CM

## 2013-05-22 DIAGNOSIS — Z79899 Other long term (current) drug therapy: Secondary | ICD-10-CM | POA: Insufficient documentation

## 2013-05-22 DIAGNOSIS — Z8742 Personal history of other diseases of the female genital tract: Secondary | ICD-10-CM | POA: Insufficient documentation

## 2013-05-22 DIAGNOSIS — Z8619 Personal history of other infectious and parasitic diseases: Secondary | ICD-10-CM | POA: Insufficient documentation

## 2013-05-22 DIAGNOSIS — Z9889 Other specified postprocedural states: Secondary | ICD-10-CM | POA: Insufficient documentation

## 2013-05-22 DIAGNOSIS — F17200 Nicotine dependence, unspecified, uncomplicated: Secondary | ICD-10-CM | POA: Insufficient documentation

## 2013-05-22 LAB — CBC WITH DIFFERENTIAL/PLATELET
Basophils Absolute: 0 10*3/uL (ref 0.0–0.1)
Basophils Relative: 0 % (ref 0–1)
Eosinophils Absolute: 0.1 10*3/uL (ref 0.0–0.7)
Eosinophils Relative: 1 % (ref 0–5)
HEMATOCRIT: 37.2 % (ref 36.0–46.0)
Hemoglobin: 12.9 g/dL (ref 12.0–15.0)
LYMPHS ABS: 3.1 10*3/uL (ref 0.7–4.0)
LYMPHS PCT: 34 % (ref 12–46)
MCH: 32.8 pg (ref 26.0–34.0)
MCHC: 34.7 g/dL (ref 30.0–36.0)
MCV: 94.7 fL (ref 78.0–100.0)
MONO ABS: 0.5 10*3/uL (ref 0.1–1.0)
MONOS PCT: 6 % (ref 3–12)
NEUTROS ABS: 5.4 10*3/uL (ref 1.7–7.7)
Neutrophils Relative %: 59 % (ref 43–77)
Platelets: 600 10*3/uL — ABNORMAL HIGH (ref 150–400)
RBC: 3.93 MIL/uL (ref 3.87–5.11)
RDW: 12.9 % (ref 11.5–15.5)
WBC: 9.1 10*3/uL (ref 4.0–10.5)

## 2013-05-22 LAB — COMPREHENSIVE METABOLIC PANEL
ALK PHOS: 57 U/L (ref 39–117)
ALT: 7 U/L (ref 0–35)
AST: 10 U/L (ref 0–37)
Albumin: 3.8 g/dL (ref 3.5–5.2)
BILIRUBIN TOTAL: 0.2 mg/dL — AB (ref 0.3–1.2)
BUN: 12 mg/dL (ref 6–23)
CHLORIDE: 98 meq/L (ref 96–112)
CO2: 24 meq/L (ref 19–32)
Calcium: 9.5 mg/dL (ref 8.4–10.5)
Creatinine, Ser: 0.66 mg/dL (ref 0.50–1.10)
GFR calc Af Amer: >90 mL/min (ref >90)
Glucose, Bld: 100 mg/dL — ABNORMAL HIGH (ref 70–99)
POTASSIUM: 4 meq/L (ref 3.7–5.3)
Sodium: 136 mEq/L — ABNORMAL LOW (ref 137–147)
Total Protein: 7.1 g/dL (ref 6.0–8.3)

## 2013-05-22 LAB — PREGNANCY, URINE: PREG TEST UR: NEGATIVE

## 2013-05-22 LAB — URINALYSIS, ROUTINE W REFLEX MICROSCOPIC
Bilirubin Urine: NEGATIVE
Glucose, UA: NEGATIVE mg/dL
HGB URINE DIPSTICK: NEGATIVE
KETONES UR: NEGATIVE mg/dL
LEUKOCYTES UA: NEGATIVE
Nitrite: NEGATIVE
PROTEIN: NEGATIVE mg/dL
Specific Gravity, Urine: 1.012 (ref 1.005–1.030)
UROBILINOGEN UA: 0.2 mg/dL (ref 0.0–1.0)
pH: 6 (ref 5.0–8.0)

## 2013-05-22 MED ORDER — OXYCODONE-ACETAMINOPHEN 5-325 MG PO TABS: 1.0000 | ORAL_TABLET | ORAL | Status: DC | PRN

## 2013-05-22 MED ORDER — SODIUM CHLORIDE 0.9 % IV BOLUS (SEPSIS)
1000.0000 mL | Freq: Once | INTRAVENOUS | Status: AC
  Administered 2013-05-22: 1000 mL via INTRAVENOUS

## 2013-05-22 MED ORDER — ONDANSETRON HCL 4 MG/2ML IJ SOLN
4.0000 mg | Freq: Once | INTRAMUSCULAR | Status: AC
  Administered 2013-05-22: 4 mg via INTRAVENOUS
  Filled 2013-05-22: qty 2

## 2013-05-22 MED ORDER — MORPHINE SULFATE 4 MG/ML IJ SOLN
4.0000 mg | Freq: Once | INTRAMUSCULAR | Status: AC
  Administered 2013-05-22: 4 mg via INTRAVENOUS
  Filled 2013-05-22: qty 1

## 2013-05-22 NOTE — ED Provider Notes (Signed)
CSN: [id number]     Arrival date & time 05/22/13  0210 History   First MD Initiated Contact with Patient 05/22/13 [phone]     Chief Complaint  Patient presents with  . Abdominal Pain     (Consider location/radiation/quality/duration/timing/severity/associated sxs/prior Treatment) HPI Comments: Patient is a 23 year old female past medical history significant for interstitial cystitis, ovarian cyst presented to emergency department for right-sided flank pain without radiation. Patient was seen in the emergency department and admitted to the hospital for pyelonephritis with renal abscess 10 days ago. She was discharged after a five-day stay in the hospital. She notes improvement in her urinary symptoms and pain with each day removed from discharge. Patient states she accidentally put her pain medication through the washing machine and has been unable to take any of her pain medication the last few days. She endorses that she has not had any fevers over 100.4F, which she was advised to return to the emergency department for a period. Patient states she denies any further dysuria, hematuria, urinary frequency or urgency. Patient is scheduled to have a followup with Dr. Louis Meckel next week for reevaluation of renal abscess. Patient denies any fevers, chills, nausea, vomiting, urinary symptoms, pelvic complaints, diarrhea or constipation. Abdominal surgical history includes D&C  Patient is a 23 y.o. female presenting with abdominal pain.  Abdominal Pain Associated symptoms: no chills, no diarrhea, no dysuria, no fever, no nausea and no vomiting     Past Medical History  Diagnosis Date  . Interstitial cystitis   . Protein in urine   . Abnormal Pap smear   . Ovarian cyst   . IC (interstitial cystitis) 04/17/2012  . GBS (group B streptococcus) UTI complicating pregnancy [id number]   Past Surgical History  Procedure Laterality Date  . Dilation and curettage of uterus     Family History  Problem  Relation Age of Onset  . Cancer Maternal Uncle   . Other Neg Hx   . Diabetes Maternal Grandmother   . Cancer Maternal Grandmother     colon  . Diabetes Paternal Grandmother   . Cancer Paternal Grandmother     bone   History  Substance Use Topics  . Smoking status: Current Every Day Smoker -- 1.00 packs/day    Types: Cigarettes  . Smokeless tobacco: Never Used  . Alcohol Use: No     Comment: occ   OB History   Grav Para Term Preterm Abortions TAB SAB Ect Mult Living   2 1 1  1  1   1      Review of Systems  Constitutional: Negative for fever and chills.  Gastrointestinal: Positive for abdominal pain. Negative for nausea, vomiting and diarrhea.  Genitourinary: Positive for flank pain. Negative for dysuria, urgency and frequency.  All other systems reviewed and are negative.      Allergies  Clarithromycin  Home Medications   Current Outpatient Rx  Name  Route  Sig  Dispense  Refill  . acetaminophen (TYLENOL) 500 MG tablet   Oral   Take 500 mg by mouth every 6 (six) hours as needed.         . ciprofloxacin (CIPRO) 500 MG tablet   Oral   Take 1 tablet (500 mg total) by mouth 2 (two) times daily.   22 tablet   0   . cyclobenzaprine (FLEXERIL) 5 MG tablet   Oral   Take 1 tablet (5 mg total) by mouth 3 (three) times daily as needed for muscle spasms.   [place]  tablet   0   . etonogestrel (IMPLANON) 68 MG IMPL implant   Subcutaneous   Inject 1 each into the skin once.         Marland Kitchen ibuprofen (ADVIL,MOTRIN) 800 MG tablet   Oral   Take 800 mg by mouth every 8 (eight) hours as needed for mild pain.         Marland Kitchen ondansetron (ZOFRAN ODT) 4 MG disintegrating tablet   Oral   Take 1 tablet (4 mg total) by mouth every 8 (eight) hours as needed for nausea.   10 tablet   0   . oxyCODONE (OXY IR/ROXICODONE) 5 MG immediate release tablet   Oral   Take 1 tablet (5 mg total) by mouth every 4 (four) hours as needed for moderate pain.   30 tablet   0    BP 114/68  Pulse  98  Temp(Src) 98.9 F (37.2 C) (Oral)  Resp 14  Ht 5\' 5"  (1.651 m)  Wt 118 lb (53.524 kg)  BMI 19.64 kg/m2  SpO2 100%  LMP 05/03/2013 Physical Exam  Nursing note and vitals reviewed. Constitutional: She is oriented to person, place, and time. She appears well-developed and well-nourished. No distress.  HENT:  Head: Normocephalic and atraumatic.  Right Ear: External ear normal.  Left Ear: External ear normal.  Nose: Nose normal.  Mouth/Throat: Oropharynx is clear and moist.  Eyes: Conjunctivae are normal.  Neck: Normal range of motion. Neck supple.  Cardiovascular: Normal rate, regular rhythm and normal heart sounds.   Pulmonary/Chest: Effort normal and breath sounds normal. No respiratory distress. She exhibits no tenderness.  Abdominal: Soft. Bowel sounds are normal. She exhibits no distension. There is no tenderness. There is no rebound and no guarding.  Musculoskeletal: Normal range of motion.  Neurological: She is alert and oriented to person, place, and time.  Skin: Skin is warm and dry. She is not diaphoretic.  Psychiatric: She has a normal mood and affect.    ED Course  Procedures (including critical care time) Medications  morphine 4 MG/ML injection 4 mg (4 mg Intravenous Given 05/22/13 0400)  ondansetron (ZOFRAN) injection 4 mg (4 mg Intravenous Given 05/22/13 0400)  sodium chloride 0.9 % bolus 1,000 mL (1,000 mLs Intravenous New Bag/Given 05/22/13 0400)    Labs Review Labs Reviewed  CBC WITH DIFFERENTIAL - Abnormal; Notable for the following:    Platelets 600 (*)    All other components within normal limits  COMPREHENSIVE METABOLIC PANEL - Abnormal; Notable for the following:    Sodium 136 (*)    Glucose, Bld 100 (*)    Total Bilirubin 0.2 (*)    All other components within normal limits  URINALYSIS, ROUTINE W REFLEX MICROSCOPIC  PREGNANCY, URINE   Imaging Review No results found.   EKG Interpretation None      MDM   Final diagnoses:  Right flank  pain    Filed Vitals:   05/22/13 0530  BP: 127/52  Pulse:   Temp:   Resp:    Afebrile, NAD, non-toxic appearing, AAOx4. Patient with returning right flank pain. Urinary symptoms have resolved. Fevers have resolved. CBC within normal limits. CMP within normal limits, no BUN or creatinine abnormalities. Urine without evidence of infection. Pain likely due to poor pain control as patient has not had any pain medication for the last 2 days. Will not obtain any imaging at this time. Will prescribe him medication at home. Advised patient to keep followup appointment with Dr. Louis Meckel next week as  scheduled. Patient is stable at time of discharge. Patient d/w with Dr. Stevie Kern, agrees with plan.        Harlow Mares, PA-C 05/22/13 [phone]

## 2013-05-22 NOTE — Discharge Instructions (Signed)
Please follow up with your primary care physician in 1-2 days. If you do not have one please call the [facility] number listed above. Please keep your follow up appointment with Dr. Louis Meckel next week as scheduled. Please take pain medication and/or muscle relaxants as prescribed and as needed for pain. Please do not drive on narcotic pain medication or on muscle relaxants. Please read all discharge instructions and return precautions.   Flank Pain Flank pain refers to pain that is located on the side of the body between the upper abdomen and the back. The pain may occur over a short period of time (acute) or may be long-term or reoccurring (chronic). It may be mild or severe. Flank pain can be caused by many things. CAUSES  Some of the more common causes of flank pain include:  Muscle strains.   Muscle spasms.   A disease of your spine (vertebral disk disease).   A lung infection (pneumonia).   Fluid around your lungs (pulmonary edema).   A kidney infection.   Kidney stones.   A very painful skin rash caused by the chickenpox virus (shingles).   Gallbladder disease.  [place] care will depend on the cause of your pain. In general,  Rest as directed by your caregiver.  Drink enough fluids to keep your urine clear or pale yellow.  Only take over-the-counter or prescription medicines as directed by your caregiver. Some medicines may help relieve the pain.  Tell your caregiver about any changes in your pain.  Follow up with your caregiver as directed. SEEK IMMEDIATE MEDICAL CARE IF:   Your pain is not controlled with medicine.   You have new or worsening symptoms.  Your pain increases.   You have abdominal pain.   You have shortness of breath.   You have persistent nausea or vomiting.   You have swelling in your abdomen.   You feel faint or pass out.   You have blood in your urine.  You have a fever or  persistent symptoms for more than 2 3 days.  You have a fever and your symptoms suddenly get worse. MAKE SURE YOU:   Understand these instructions.  Will watch your condition.  Will get help right away if you are not doing well or get worse. Document Released: 04/14/2005 Document Revised: 11/16/2011 Document Reviewed: 10/06/2011 [facility] Patient Information 2014 [place].

## 2013-05-22 NOTE — ED Notes (Signed)
Pt. reports low abdominal pain and right low back  pain for 3 days , denies nausea or vomitting , no diarrhea , no fever or chills. Denies vaginal discharge or urinary discomfort . Pt. currently taking Cipro for UTI.

## 2013-05-22 NOTE — ED Notes (Signed)
Pt A&OX4, ambulatory at discharge, verbalizing no complaints at this time.

## 2013-05-22 NOTE — ED Notes (Signed)
Pt's husband is driving her home. 

## 2013-05-24 NOTE — ED Provider Notes (Signed)
Medical screening examination/treatment/procedure(s) were performed by non-physician practitioner and as supervising physician I was immediately available for consultation/collaboration.   EKG Interpretation None       Babette Relic, MD 05/24/13 [phone]

## 2013-05-28 ENCOUNTER — Encounter (HOSPITAL_COMMUNITY): Payer: Self-pay | Admitting: Emergency Medicine

## 2013-05-28 ENCOUNTER — Emergency Department (HOSPITAL_COMMUNITY)
Admission: EM | Admit: 2013-05-28 | Discharge: 2013-05-28 | Disposition: A | Discharge status: Home / Self Care | Payer: Medicaid Other | Attending: Emergency Medicine | Admitting: Emergency Medicine

## 2013-05-28 ENCOUNTER — Emergency Department (HOSPITAL_COMMUNITY): Payer: Medicaid Other

## 2013-05-28 ENCOUNTER — Emergency Department (INDEPENDENT_AMBULATORY_CARE_PROVIDER_SITE_OTHER)
Admission: EM | Admit: 2013-05-28 | Discharge: 2013-05-28 | Disposition: A | Discharge status: Home / Self Care | Payer: Medicaid Other | Source: Home / Self Care | Attending: Family Medicine | Admitting: Family Medicine

## 2013-05-28 DIAGNOSIS — R109 Unspecified abdominal pain: Secondary | ICD-10-CM

## 2013-05-28 DIAGNOSIS — Z87440 Personal history of urinary (tract) infections: Secondary | ICD-10-CM | POA: Insufficient documentation

## 2013-05-28 DIAGNOSIS — Z87448 Personal history of other diseases of urinary system: Secondary | ICD-10-CM | POA: Insufficient documentation

## 2013-05-28 DIAGNOSIS — Z8742 Personal history of other diseases of the female genital tract: Secondary | ICD-10-CM | POA: Insufficient documentation

## 2013-05-28 DIAGNOSIS — Z872 Personal history of diseases of the skin and subcutaneous tissue: Secondary | ICD-10-CM | POA: Insufficient documentation

## 2013-05-28 DIAGNOSIS — F17200 Nicotine dependence, unspecified, uncomplicated: Secondary | ICD-10-CM | POA: Insufficient documentation

## 2013-05-28 DIAGNOSIS — Z3202 Encounter for pregnancy test, result negative: Secondary | ICD-10-CM | POA: Insufficient documentation

## 2013-05-28 LAB — CBC WITH DIFFERENTIAL/PLATELET
Basophils Absolute: 0 10*3/uL (ref 0.0–0.1)
Basophils Relative: 0 % (ref 0–1)
EOS ABS: 0.1 10*3/uL (ref 0.0–0.7)
EOS PCT: 2 % (ref 0–5)
HEMATOCRIT: 34.7 % — AB (ref 36.0–46.0)
Hemoglobin: 12.1 g/dL (ref 12.0–15.0)
LYMPHS ABS: 3.1 10*3/uL (ref 0.7–4.0)
LYMPHS PCT: 46 % (ref 12–46)
MCH: 32.7 pg (ref 26.0–34.0)
MCHC: 34.9 g/dL (ref 30.0–36.0)
MCV: 93.8 fL (ref 78.0–100.0)
MONO ABS: 0.4 10*3/uL (ref 0.1–1.0)
MONOS PCT: 6 % (ref 3–12)
Neutro Abs: 3.1 10*3/uL (ref 1.7–7.7)
Neutrophils Relative %: 46 % (ref 43–77)
Platelets: 356 10*3/uL (ref 150–400)
RBC: 3.7 MIL/uL — AB (ref 3.87–5.11)
RDW: 12.9 % (ref 11.5–15.5)
WBC: 6.7 10*3/uL (ref 4.0–10.5)

## 2013-05-28 LAB — URINALYSIS, ROUTINE W REFLEX MICROSCOPIC
BILIRUBIN URINE: NEGATIVE
Glucose, UA: NEGATIVE mg/dL
HGB URINE DIPSTICK: NEGATIVE
Ketones, ur: NEGATIVE mg/dL
Leukocytes, UA: NEGATIVE
NITRITE: NEGATIVE
PH: 6.5 (ref 5.0–8.0)
Protein, ur: NEGATIVE mg/dL
SPECIFIC GRAVITY, URINE: 1.008 (ref 1.005–1.030)
Urobilinogen, UA: 0.2 mg/dL (ref 0.0–1.0)

## 2013-05-28 LAB — POC URINE PREG, ED: Preg Test, Ur: NEGATIVE

## 2013-05-28 LAB — I-STAT CHEM 8, ED
BUN: 8 mg/dL (ref 6–23)
CALCIUM ION: 1.22 mmol/L (ref 1.12–1.23)
CREATININE: 0.7 mg/dL (ref 0.50–1.10)
Chloride: 102 mEq/L (ref 96–112)
GLUCOSE: 94 mg/dL (ref 70–99)
HCT: 37 % (ref 36.0–46.0)
HEMOGLOBIN: 12.6 g/dL (ref 12.0–15.0)
Potassium: 3.7 mEq/L (ref 3.7–5.3)
Sodium: 140 mEq/L (ref 137–147)
TCO2: 25 mmol/L (ref 0–100)

## 2013-05-28 LAB — I-STAT CG4 LACTIC ACID, ED: Lactic Acid, Venous: 0.68 mmol/L (ref 0.5–2.2)

## 2013-05-28 MED ORDER — HYDROMORPHONE HCL PF 1 MG/ML IJ SOLN
1.0000 mg | Freq: Once | INTRAMUSCULAR | Status: AC
  Administered 2013-05-28: 1 mg via INTRAVENOUS
  Filled 2013-05-28: qty 1

## 2013-05-28 MED ORDER — KETOROLAC TROMETHAMINE 30 MG/ML IJ SOLN
30.0000 mg | Freq: Once | INTRAMUSCULAR | Status: AC
  Administered 2013-05-28: 30 mg via INTRAMUSCULAR

## 2013-05-28 MED ORDER — KETOROLAC TROMETHAMINE 30 MG/ML IJ SOLN
INTRAMUSCULAR | Status: AC
  Filled 2013-05-28: qty 1

## 2013-05-28 MED ORDER — IOHEXOL 300 MG/ML  SOLN
80.0000 mL | Freq: Once | INTRAMUSCULAR | Status: AC | PRN
  Administered 2013-05-28: 80 mL via INTRAVENOUS

## 2013-05-28 MED ORDER — TRAMADOL HCL 50 MG PO TABS: 50.0000 mg | ORAL_TABLET | Freq: Four times a day (QID) | ORAL | Status: DC | PRN

## 2013-05-28 MED ORDER — ONDANSETRON HCL 4 MG/2ML IJ SOLN
4.0000 mg | Freq: Once | INTRAMUSCULAR | Status: AC
Start: 2013-05-28 — End: 2013-05-28
  Administered 2013-05-28: 4 mg via INTRAVENOUS
  Filled 2013-05-28: qty 2

## 2013-05-28 MED ORDER — HYDROMORPHONE HCL PF 1 MG/ML IJ SOLN
0.5000 mg | INTRAMUSCULAR | Status: AC | PRN
  Administered 2013-05-28 (×2): 0.5 mg via INTRAVENOUS
  Filled 2013-05-28 (×2): qty 1

## 2013-05-28 MED ORDER — IOHEXOL 300 MG/ML  SOLN: 25.0000 mL | INTRAMUSCULAR | Status: DC

## 2013-05-28 MED ORDER — FENTANYL CITRATE 0.05 MG/ML IJ SOLN
100.0000 ug | Freq: Once | INTRAMUSCULAR | Status: AC
  Administered 2013-05-28: 100 ug via INTRAVENOUS
  Filled 2013-05-28: qty 2

## 2013-05-28 MED ORDER — NAPROXEN 500 MG PO TABS: 500.0000 mg | ORAL_TABLET | Freq: Two times a day (BID) | ORAL | Status: DC

## 2013-05-28 MED ORDER — IOHEXOL 300 MG/ML  SOLN
25.0000 mL | INTRAMUSCULAR | Status: AC
  Administered 2013-05-28 (×2): 25 mL via ORAL

## 2013-05-28 NOTE — ED Notes (Addendum)
Patient complains of lower back pain and lower abdominal pain that started 3 weeks ago; complains of abdominal pain that started 2 day ago. Was seen in ED last night for same thing and was given Toradol; States Toradol did not help and is here for something else.

## 2013-05-28 NOTE — Discharge Instructions (Signed)
Continue your medicines and see the urologist on thurs as planned.
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
## 2013-05-28 NOTE — ED Notes (Signed)
Pt. reports right flank pain with hematuria yesterday , pt. stated history of kidney abscess completed her Cipro antibiotic last week . Denies fever or chills.

## 2013-05-28 NOTE — Discharge Instructions (Signed)
Flank Pain °Flank pain is pain in your side. The flank is the area of your side between your upper belly (abdomen) and your back. Pain in this area can be caused by many different things. °HOME CARE °Home care and treatment will depend on the cause of your pain. °· Rest as told by your doctor. °· Drink enough fluids to keep your pee (urine) clear or pale yellow.   °· Only take medicine as told by your doctor. °· Tell your doctor about any changes in your pain. °· Follow up with your doctor. °GET HELP RIGHT AWAY IF:  °· Your pain does not get better with medicine.   °· You have new symptoms or your symptoms get worse. °· Your pain gets worse.   °· You have belly (abdominal) pain.   °· You are short of breath.   °· You always feel sick to your stomach (nauseous).   °· You keep throwing up (vomiting).   °· You have puffiness (swelling) in your belly.   °· You feel lightheaded or you pass out (faint).   °· You have blood in your pee. °· You have a fever or lasting symptoms for more than 2 3 days. °· You have a fever and your symptoms suddenly get worse. °MAKE SURE YOU:  °· Understand these instructions. °· Will watch your condition. °· Will get help right away if you are not doing well or get worse. °Document Released: 12/01/2007 Document Revised: 11/16/2011 Document Reviewed: 10/06/2011 °[employer]® Patient Information ©2014 [employer]. ° °

## 2013-05-28 NOTE — ED Provider Notes (Signed)
CSN: [id number]     Arrival date & time 05/28/13  0217 History   First MD Initiated Contact with Patient 05/28/13 0501     Chief Complaint  Patient presents with  . Flank Pain     (Consider location/radiation/quality/duration/timing/severity/associated sxs/prior Treatment) HPI 23 year old female presents to emergency department with complaint of persistent and worsening right flank.  Pain.  She reports that she had some blood yesterday when going to the bathroom.  She is unsure if it was in her urine or coming from her vagina.  She reports that she has irregular menses.  Patient recently had pyelonephritis, with possible abscess, seen by me in the ED on 3/7.  She finished her Cipro last week.  Patient accidentally washed her Roxicet prescribed to her at discharge.  Patient was seen in the emergency department 3/18, and given additional Percocet prescription.  Patient, reports she's been taking the Percocets to a time, and they have not been covering her pain.  She reports fever to 101 today.  She is follow up with urology on Thursday.  No vomiting.  She has had some nausea when taking Tylenol. Past Medical History  Diagnosis Date  . Interstitial cystitis   . Protein in urine   . Abnormal Pap smear   . Ovarian cyst   . IC (interstitial cystitis) 04/17/2012  . GBS (group B streptococcus) UTI complicating pregnancy 9/76/7341   Past Surgical History  Procedure Laterality Date  . Dilation and curettage of uterus     Family History  Problem Relation Age of Onset  . Cancer Maternal Uncle   . Other Neg Hx   . Diabetes Maternal Grandmother   . Cancer Maternal Grandmother     colon  . Diabetes Paternal Grandmother   . Cancer Paternal Grandmother     bone   History  Substance Use Topics  . Smoking status: Current Every Day Smoker -- 1.00 packs/day    Types: Cigarettes  . Smokeless tobacco: Never Used  . Alcohol Use: No     Comment: occ   OB History   Grav Para Term Preterm Abortions  TAB SAB Ect Mult Living   2 1 1  1  1   1      Review of Systems   See History of Present Illness; otherwise all other systems are reviewed and negative  Allergies  Clarithromycin  Home Medications   Current Outpatient Rx  Name  Route  Sig  Dispense  Refill  . acetaminophen (TYLENOL) 500 MG tablet   Oral   Take 500-1,000 mg by mouth every 6 (six) hours as needed for mild pain or headache.          . etonogestrel (IMPLANON) 68 MG IMPL implant   Subcutaneous   Inject 1 each into the skin once.         Marland Kitchen ibuprofen (ADVIL,MOTRIN) 800 MG tablet   Oral   Take 800 mg by mouth every 8 (eight) hours as needed for headache, mild pain or moderate pain.           BP 107/58  Pulse 77  Temp(Src) 98.9 F (37.2 C) (Oral)  Resp 16  SpO2 98%  LMP 05/03/2013 Physical Exam  Nursing note and vitals reviewed. Constitutional: She is oriented to person, place, and time. She appears well-developed and well-nourished. She appears distressed (uncomfortable appearing).  HENT:  Head: Normocephalic and atraumatic.  Nose: Nose normal.  Mouth/Throat: Oropharynx is clear and moist.  Eyes: Conjunctivae and EOM are normal.  Pupils are equal, round, and reactive to light.  Neck: Normal range of motion. Neck supple. No JVD present. No tracheal deviation present. No thyromegaly present.  Cardiovascular: Normal rate, regular rhythm, normal heart sounds and intact distal pulses.  Exam reveals no gallop and no friction rub.   No murmur heard. Pulmonary/Chest: Effort normal and breath sounds normal. No stridor. No respiratory distress. She has no wheezes. She has no rales. She exhibits no tenderness.  Abdominal: Soft. Bowel sounds are normal. She exhibits no distension and no mass. There is no tenderness. There is no rebound and no guarding.  Right CVA tenderness  Musculoskeletal: Normal range of motion. She exhibits no edema and no tenderness.  Lymphadenopathy:    She has no cervical adenopathy.   Neurological: She is alert and oriented to person, place, and time. She exhibits normal muscle tone. Coordination normal.  Skin: Skin is warm and dry. No rash noted. No erythema. No pallor.  Psychiatric: She has a normal mood and affect. Her behavior is normal. Judgment and thought content normal.    ED Course  Procedures (including critical care time) Labs Review Labs Reviewed  CBC WITH DIFFERENTIAL - Abnormal; Notable for the following:    RBC 3.70 (*)    HCT 34.7 (*)    All other components within normal limits  URINALYSIS, ROUTINE W REFLEX MICROSCOPIC  POC URINE PREG, ED  I-STAT CHEM 8, ED  I-STAT CG4 LACTIC ACID, ED   Imaging Review No results found.   EKG Interpretation None      MDM   Final diagnoses:  None    23 year old female with persistent right flank pain after recent pyelonephritis, and early abscess.  Her labs today are unremarkable.  She shows no signs of urinary infection.  White blood cell counts are normal.  Given her persistent pain and reported fever, we'll re\re CAT scan to evaluate for her recent pyelonephritis.  Patient has followup with urology on Thursday.  I have some concern given her increased use of pain medications as there may be a element of inappropriate medication usage.  Care passed to Dr Jeneen Rinks awaiting CT scan at change of shift.  Kalman Drape, MD 05/28/13 [phone]

## 2013-05-28 NOTE — ED Provider Notes (Signed)
CSN: [id number]     Arrival date & time 05/28/13  1940 History   First MD Initiated Contact with Patient 05/28/13 2019     Chief Complaint  Patient presents with  . Abdominal Pain  . Back Pain   (Consider location/radiation/quality/duration/timing/severity/associated sxs/prior Treatment) Patient is a 23 y.o. female presenting with abdominal pain and back pain. The history is provided by the patient.  Abdominal Pain Pain location:  Suprapubic and R flank Pain quality: cramping   Pain severity:  Moderate Onset quality:  Gradual Progression:  Unchanged Chronicity:  Chronic Context: recent illness   Context comment:  Seen earlier today in ER with full re-eval, has appt on thurs for urology recheck, , states toradol not helping. Associated symptoms: no chills, no diarrhea, no fever, no nausea and no vomiting   Back Pain Associated symptoms: abdominal pain   Associated symptoms: no fever     Past Medical History  Diagnosis Date  . Interstitial cystitis   . Protein in urine   . Abnormal Pap smear   . Ovarian cyst   . IC (interstitial cystitis) 04/17/2012  . GBS (group B streptococcus) UTI complicating pregnancy 06/28/5359   Past Surgical History  Procedure Laterality Date  . Dilation and curettage of uterus     Family History  Problem Relation Age of Onset  . Cancer Maternal Uncle   . Other Neg Hx   . Diabetes Maternal Grandmother   . Cancer Maternal Grandmother     colon  . Diabetes Paternal Grandmother   . Cancer Paternal Grandmother     bone   History  Substance Use Topics  . Smoking status: Current Every Day Smoker -- 1.00 packs/day    Types: Cigarettes  . Smokeless tobacco: Never Used  . Alcohol Use: No     Comment: occ   OB History   Grav Para Term Preterm Abortions TAB SAB Ect Mult Living   2 1 1  1  1   1      Review of Systems  Constitutional: Negative.  Negative for fever and chills.  Gastrointestinal: Positive for abdominal pain. Negative for nausea,  vomiting and diarrhea.  Genitourinary: Negative.   Musculoskeletal: Positive for back pain.    Allergies  Clarithromycin  Home Medications   Current Outpatient Rx  Name  Route  Sig  Dispense  Refill  . acetaminophen (TYLENOL) 500 MG tablet   Oral   Take 500-1,000 mg by mouth every 6 (six) hours as needed for mild pain or headache.          . ibuprofen (ADVIL,MOTRIN) 800 MG tablet   Oral   Take 800 mg by mouth every 8 (eight) hours as needed for headache, mild pain or moderate pain.          . naproxen (NAPROSYN) 500 MG tablet   Oral   Take 1 tablet (500 mg total) by mouth 2 (two) times daily.   30 tablet   0   . traMADol (ULTRAM) 50 MG tablet   Oral   Take 1 tablet (50 mg total) by mouth every 6 (six) hours as needed.   15 tablet   0   . etonogestrel (IMPLANON) 68 MG IMPL implant   Subcutaneous   Inject 1 each into the skin once.          BP 109/62  Pulse 106  Temp(Src) 98.9 F (37.2 C) (Oral)  Resp 20  SpO2 100%  LMP 05/03/2013 Physical Exam  Nursing note and vitals  reviewed. Constitutional: She is oriented to person, place, and time. She appears well-developed and well-nourished. No distress.  Pulmonary/Chest: Breath sounds normal.  Abdominal: Soft. Bowel sounds are normal. She exhibits no distension and no mass. There is tenderness. There is no rebound and no guarding.  Neurological: She is alert and oriented to person, place, and time.  Skin: Skin is warm and dry.    ED Course  Procedures (including critical care time) Labs Review Labs Reviewed - No data to display Imaging Review Ct Abdomen Pelvis W Contrast  05/28/2013   CLINICAL DATA:  Increasing right flank pain.  Right renal abscess.  EXAM: CT ABDOMEN AND PELVIS WITH CONTRAST  TECHNIQUE: Multidetector CT imaging of the abdomen and pelvis was performed using the standard protocol following bolus administration of intravenous contrast.  CONTRAST:  106mL OMNIPAQUE IOHEXOL 300 MG/ML  SOLN   COMPARISON:  05/11/2013  FINDINGS: The right renal abscess has almost completely resolved and measuring 6 mm in diameter. There was a vague area of cortical decreased density slightly inferior and lateral to the abscess which I think represented focal pyelonephritis in that has almost completely resolved.  There is a stable 16 mm cyst in the upper pole of the left kidney. There is no hydronephrosis.  There is slight prominence of the biliary tree and pancreatic duct without visible stones or masses. Liver parenchyma, spleen, pancreas, and adrenal glands are normal. The bowel is normal. Uterus and ovaries and bladder appear normal.  IMPRESSION: 1. Almost complete resolution of the right renal abscess. Almost complete resolution of adjacent pyelonephritis. 2. No new acute abnormalities. 3. Slight prominence of the pancreatic duct and biliary tree of unknown etiology. The patient has a normal bilirubin.   Electronically Signed   By: Rozetta Nunnery M.D.   On: 05/28/2013 10:09     MDM   1. Abdominal pain        Billy Fischer, MD 05/28/13 2044

## 2013-05-28 NOTE — ED Provider Notes (Signed)
Skin showed near-complete resolution of abnormalities noted with her previous renal abscess. Urinalysis is clear. Afebrile. Plan will be outpatient followup  Tanna Furry, MD 05/28/13 1028

## 2013-06-01 ENCOUNTER — Emergency Department (HOSPITAL_COMMUNITY)
Admission: EM | Admit: 2013-06-01 | Discharge: 2013-06-01 | Disposition: A | Discharge status: Home / Self Care | Payer: Medicaid Other | Attending: Emergency Medicine | Admitting: Emergency Medicine

## 2013-06-01 ENCOUNTER — Encounter (HOSPITAL_COMMUNITY): Payer: Self-pay | Admitting: Emergency Medicine

## 2013-06-01 DIAGNOSIS — Z79899 Other long term (current) drug therapy: Secondary | ICD-10-CM | POA: Insufficient documentation

## 2013-06-01 DIAGNOSIS — R52 Pain, unspecified: Secondary | ICD-10-CM

## 2013-06-01 DIAGNOSIS — Z87448 Personal history of other diseases of urinary system: Secondary | ICD-10-CM | POA: Insufficient documentation

## 2013-06-01 DIAGNOSIS — Z791 Long term (current) use of non-steroidal anti-inflammatories (NSAID): Secondary | ICD-10-CM | POA: Insufficient documentation

## 2013-06-01 DIAGNOSIS — T07XXXA Unspecified multiple injuries, initial encounter: Secondary | ICD-10-CM | POA: Insufficient documentation

## 2013-06-01 DIAGNOSIS — Z8619 Personal history of other infectious and parasitic diseases: Secondary | ICD-10-CM | POA: Insufficient documentation

## 2013-06-01 DIAGNOSIS — Z8742 Personal history of other diseases of the female genital tract: Secondary | ICD-10-CM | POA: Insufficient documentation

## 2013-06-01 DIAGNOSIS — F411 Generalized anxiety disorder: Secondary | ICD-10-CM | POA: Insufficient documentation

## 2013-06-01 DIAGNOSIS — IMO0002 Reserved for concepts with insufficient information to code with codable children: Secondary | ICD-10-CM | POA: Insufficient documentation

## 2013-06-01 DIAGNOSIS — F17200 Nicotine dependence, unspecified, uncomplicated: Secondary | ICD-10-CM | POA: Insufficient documentation

## 2013-06-01 DIAGNOSIS — Z87440 Personal history of urinary (tract) infections: Secondary | ICD-10-CM | POA: Insufficient documentation

## 2013-06-01 DIAGNOSIS — T7492XA Unspecified child maltreatment, confirmed, initial encounter: Secondary | ICD-10-CM | POA: Insufficient documentation

## 2013-06-01 DIAGNOSIS — T7491XA Unspecified adult maltreatment, confirmed, initial encounter: Secondary | ICD-10-CM | POA: Insufficient documentation

## 2013-06-01 MED ORDER — HYDROCODONE-ACETAMINOPHEN 5-325 MG PO TABS
2.0000 | ORAL_TABLET | Freq: Once | ORAL | Status: AC
  Administered 2013-06-01: 2 via ORAL
  Filled 2013-06-01: qty 2

## 2013-06-01 MED ORDER — HYDROCODONE-ACETAMINOPHEN 5-325 MG PO TABS: 2.0000 | ORAL_TABLET | ORAL | Status: DC | PRN

## 2013-06-01 MED ORDER — IBUPROFEN 200 MG PO TABS
600.0000 mg | ORAL_TABLET | Freq: Once | ORAL | Status: AC
  Administered 2013-06-01: 600 mg via ORAL
  Filled 2013-06-01: qty 3

## 2013-06-01 NOTE — ED Notes (Signed)
Pt given fluids, graham crackers and peanut butter to eat.
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
## 2013-06-01 NOTE — Discharge Instructions (Signed)
If you feel unsafe please call the police or come to the ER. Ice as needed. Take tylenol and motrin for pain.  If you were given medicines take as directed.  If you are on coumadin or contraceptives realize their levels and effectiveness is altered by many different medicines.  If you have any reaction (rash, tongues swelling, other) to the medicines stop taking and see a physician.   Please follow up as directed and return to the ER or see a physician for new or worsening symptoms.  Thank you. Filed Vitals:   06/01/13 0430 06/01/13 0433 06/01/13 0451 06/01/13 0534  BP: 103/61  99/56 110/66  Pulse: 87  94 81  Temp:   98 F (36.7 C)   TempSrc:   Oral   Resp:   18 17  Height:      Weight:      SpO2: 100% 97% 99% 98%

## 2013-06-01 NOTE — ED Notes (Signed)
Pt ambulated in the room/hallway.  Pt c/o of generalized body pain.  Pt O2 96% with HR 112.  Pt is tolerating PO fluids.  MD Zavitz made aware.

## 2013-07-17 ENCOUNTER — Emergency Department (HOSPITAL_COMMUNITY)
Admission: EM | Admit: 2013-07-17 | Discharge: 2013-07-17 | Disposition: A | Discharge status: Home / Self Care | Payer: Medicaid Other | Attending: Emergency Medicine | Admitting: Emergency Medicine

## 2013-07-17 ENCOUNTER — Encounter (HOSPITAL_COMMUNITY): Payer: Self-pay | Admitting: Emergency Medicine

## 2013-07-17 DIAGNOSIS — Z8742 Personal history of other diseases of the female genital tract: Secondary | ICD-10-CM | POA: Insufficient documentation

## 2013-07-17 DIAGNOSIS — F17200 Nicotine dependence, unspecified, uncomplicated: Secondary | ICD-10-CM | POA: Insufficient documentation

## 2013-07-17 DIAGNOSIS — Z87448 Personal history of other diseases of urinary system: Secondary | ICD-10-CM | POA: Insufficient documentation

## 2013-07-17 DIAGNOSIS — N10 Acute tubulo-interstitial nephritis: Secondary | ICD-10-CM | POA: Insufficient documentation

## 2013-07-17 DIAGNOSIS — Z3202 Encounter for pregnancy test, result negative: Secondary | ICD-10-CM | POA: Insufficient documentation

## 2013-07-17 LAB — URINE MICROSCOPIC-ADD ON

## 2013-07-17 LAB — URINALYSIS, ROUTINE W REFLEX MICROSCOPIC
Bilirubin Urine: NEGATIVE
GLUCOSE, UA: NEGATIVE mg/dL
Ketones, ur: NEGATIVE mg/dL
Nitrite: POSITIVE — AB
PH: 5.5 (ref 5.0–8.0)
Protein, ur: 30 mg/dL — AB
Specific Gravity, Urine: 1.025 (ref 1.005–1.030)
Urobilinogen, UA: 0.2 mg/dL (ref 0.0–1.0)

## 2013-07-17 LAB — BASIC METABOLIC PANEL
BUN: 9 mg/dL (ref 6–23)
CHLORIDE: 101 meq/L (ref 96–112)
CO2: 25 meq/L (ref 19–32)
Calcium: 9.9 mg/dL (ref 8.4–10.5)
Creatinine, Ser: 0.66 mg/dL (ref 0.50–1.10)
GFR calc Af Amer: >90 mL/min (ref >90)
GFR calc non Af Amer: >90 mL/min (ref >90)
Glucose, Bld: 93 mg/dL (ref 70–99)
POTASSIUM: 3.3 meq/L — AB (ref 3.7–5.3)
Sodium: 141 mEq/L (ref 137–147)

## 2013-07-17 LAB — CBC
HEMATOCRIT: 40 % (ref 36.0–46.0)
HEMOGLOBIN: 13.8 g/dL (ref 12.0–15.0)
MCH: 33.1 pg (ref 26.0–34.0)
MCHC: 34.5 g/dL (ref 30.0–36.0)
MCV: 95.9 fL (ref 78.0–100.0)
Platelets: 409 10*3/uL — ABNORMAL HIGH (ref 150–400)
RBC: 4.17 MIL/uL (ref 3.87–5.11)
RDW: 13 % (ref 11.5–15.5)
WBC: 9 10*3/uL (ref 4.0–10.5)

## 2013-07-17 LAB — POC URINE PREG, ED: Preg Test, Ur: NEGATIVE

## 2013-07-17 MED ORDER — HYDROCODONE-ACETAMINOPHEN 5-325 MG PO TABS: 1.0000 | ORAL_TABLET | Freq: Four times a day (QID) | ORAL | Status: DC | PRN

## 2013-07-17 MED ORDER — ONDANSETRON HCL 4 MG/2ML IJ SOLN
4.0000 mg | Freq: Once | INTRAMUSCULAR | Status: AC
  Administered 2013-07-17: 4 mg via INTRAVENOUS
  Filled 2013-07-17: qty 2

## 2013-07-17 MED ORDER — POTASSIUM CHLORIDE CRYS ER 20 MEQ PO TBCR
40.0000 meq | EXTENDED_RELEASE_TABLET | Freq: Once | ORAL | Status: AC
  Administered 2013-07-17: 40 meq via ORAL
  Filled 2013-07-17: qty 2

## 2013-07-17 MED ORDER — SODIUM CHLORIDE 0.9 % IV BOLUS (SEPSIS)
2000.0000 mL | Freq: Once | INTRAVENOUS | Status: AC
  Administered 2013-07-17: 1000 mL via INTRAVENOUS

## 2013-07-17 MED ORDER — SULFAMETHOXAZOLE-TRIMETHOPRIM 800-160 MG PO TABS: 1.0000 | ORAL_TABLET | Freq: Two times a day (BID) | ORAL | Status: DC

## 2013-07-17 MED ORDER — MORPHINE SULFATE 4 MG/ML IJ SOLN
6.0000 mg | Freq: Once | INTRAMUSCULAR | Status: AC
  Administered 2013-07-17: 6 mg via INTRAVENOUS
  Filled 2013-07-17: qty 2

## 2013-07-17 MED ORDER — ONDANSETRON HCL 8 MG PO TABS: 8.0000 mg | ORAL_TABLET | Freq: Three times a day (TID) | ORAL | Status: DC | PRN

## 2013-07-17 MED ORDER — DEXTROSE 5 % IV SOLN
1.0000 g | Freq: Once | INTRAVENOUS | Status: AC
  Administered 2013-07-17: 21:00:00 via INTRAVENOUS
  Filled 2013-07-17: qty 10

## 2013-07-17 MED ORDER — MORPHINE SULFATE 4 MG/ML IJ SOLN
4.0000 mg | Freq: Once | INTRAMUSCULAR | Status: AC
  Administered 2013-07-17: 4 mg via INTRAVENOUS
  Filled 2013-07-17: qty 1

## 2013-07-17 NOTE — ED Notes (Signed)
MD at bedside. EDPJ 

## 2013-07-17 NOTE — ED Notes (Signed)
Pt reports low abd and low back pain since yesterday with nausea.  Pt denies any vomiting at this time.  Pt reports being admitted to the hospital ~2 months ago for kidney infection and abscess.  Pt reports the back pain feels the same as before when she had the kidney infection.  Pt reports urinary urgency and retention.

## 2013-07-17 NOTE — ED Provider Notes (Signed)
CSN: [id number]     Arrival date & time 07/17/13  1733 History   First MD Initiated Contact with Patient 07/17/13 1801     Chief Complaint  Patient presents with  . Dysuria  . Back Pain     (Consider location/radiation/quality/duration/timing/severity/associated sxs/prior Treatment) HPI Patient complains of right flank pain radiating to the suprapubic area onset last night accompanied by dysuria and fever. She had temperature of 102 this morning. She treated herself with Tylenol and neurologic. Admits to nausea and no vomiting no other associated symptoms. Nothing makes symptoms better or worse. Past Medical History  Diagnosis Date  . Interstitial cystitis   . Protein in urine   . Abnormal Pap smear   . Ovarian cyst   . IC (interstitial cystitis) 04/17/2012  . GBS (group B streptococcus) UTI complicating pregnancy 7/61/6073   Past Surgical History  Procedure Laterality Date  . Dilation and curettage of uterus     Family History  Problem Relation Age of Onset  . Cancer Maternal Uncle   . Other Neg Hx   . Diabetes Maternal Grandmother   . Cancer Maternal Grandmother     colon  . Diabetes Paternal Grandmother   . Cancer Paternal Grandmother     bone   History  Substance Use Topics  . Smoking status: Current Every Day Smoker -- 1.00 packs/day    Types: Cigarettes  . Smokeless tobacco: Never Used  . Alcohol Use: No     Comment: occ   OB History   Grav Para Term Preterm Abortions TAB SAB Ect Mult Living   2 1 1  1  1   1      Review of Systems  Constitutional: Positive for fever.  Genitourinary: Positive for dysuria and flank pain.       Currently on menses  All other systems reviewed and are negative.     Allergies  Clarithromycin  Home Medications   Prior to Admission medications   Medication Sig Start Date End Date Taking? Authorizing Provider  acetaminophen (TYLENOL) 500 MG tablet Take 500-1,000 mg by mouth every 6 (six) hours as needed for mild pain or  headache.     Historical Provider, MD  etonogestrel (IMPLANON) 68 MG IMPL implant Inject 1 each into the skin once.    Historical Provider, MD  ibuprofen (ADVIL,MOTRIN) 800 MG tablet Take 800 mg by mouth every 8 (eight) hours as needed for headache, mild pain or moderate pain.     Historical Provider, MD  traMADol (ULTRAM) 50 MG tablet Take 1 tablet (50 mg total) by mouth every 6 (six) hours as needed. 05/28/13   Tanna Furry, MD   BP 110/70  Pulse 83  Temp(Src) 98.7 F (37.1 C) (Oral)  Resp 16  SpO2 100%  LMP 07/14/2013 Physical Exam  Nursing note and vitals reviewed. Constitutional: She appears well-developed and well-nourished.  HENT:  Head: Normocephalic and atraumatic.  Eyes: Conjunctivae are normal. Pupils are equal, round, and reactive to light.  Neck: Neck supple. No tracheal deviation present. No thyromegaly present.  Cardiovascular: Normal rate and regular rhythm.   No murmur heard. Pulmonary/Chest: Effort normal and breath sounds normal.  Abdominal: Soft. Bowel sounds are normal. She exhibits no distension. There is tenderness.  Mildly tender at suprapubic area  Genitourinary:   Right flank tenderness  Musculoskeletal: Normal range of motion. She exhibits no edema and no tenderness.  Neurological: She is alert. Coordination normal.  Skin: Skin is warm and dry. No rash noted.  Psychiatric:  She has a normal mood and affect.    ED Course  Procedures (including critical care time) Labs Review Labs Reviewed  BASIC METABOLIC PANEL  CBC  URINALYSIS, ROUTINE W REFLEX MICROSCOPIC  POC URINE PREG, ED   9:40 PM patient feels improved rated a home after treatment with intravenous opioids , antibiotics and intravenous fluids. Imaging Review No results found.   EKG Interpretation None     Results for orders placed during the hospital encounter of 50/09/38  BASIC METABOLIC PANEL      Result Value Ref Range   Sodium 141  137 - 147 mEq/L   Potassium 3.3 (*) 3.7 - 5.3 mEq/L    Chloride 101  96 - 112 mEq/L   CO2 25  19 - 32 mEq/L   Glucose, Bld 93  70 - 99 mg/dL   BUN 9  6 - 23 mg/dL   Creatinine, Ser 0.66  0.50 - 1.10 mg/dL   Calcium 9.9  8.4 - 10.5 mg/dL   GFR calc non Af Amer >90  >90 mL/min   GFR calc Af Amer >90  >90 mL/min  CBC      Result Value Ref Range   WBC 9.0  4.0 - 10.5 K/uL   RBC 4.17  3.87 - 5.11 MIL/uL   Hemoglobin 13.8  12.0 - 15.0 g/dL   HCT 40.0  36.0 - 46.0 %   MCV 95.9  78.0 - 100.0 fL   MCH 33.1  26.0 - 34.0 pg   MCHC 34.5  30.0 - 36.0 g/dL   RDW 13.0  11.5 - 15.5 %   Platelets 409 (*) 150 - 400 K/uL  URINALYSIS, ROUTINE W REFLEX MICROSCOPIC      Result Value Ref Range   Color, Urine AMBER (*) YELLOW   APPearance CLOUDY (*) CLEAR   Specific Gravity, Urine 1.025  1.005 - 1.030   pH 5.5  5.0 - 8.0   Glucose, UA NEGATIVE  NEGATIVE mg/dL   Hgb urine dipstick LARGE (*) NEGATIVE   Bilirubin Urine NEGATIVE  NEGATIVE   Ketones, ur NEGATIVE  NEGATIVE mg/dL   Protein, ur 30 (*) NEGATIVE mg/dL   Urobilinogen, UA 0.2  0.0 - 1.0 mg/dL   Nitrite POSITIVE (*) NEGATIVE   Leukocytes, UA LARGE (*) NEGATIVE  URINE MICROSCOPIC-ADD ON      Result Value Ref Range   Squamous Epithelial / LPF FEW (*) RARE   WBC, UA 21-50  <3 WBC/hpf   RBC / HPF 0-2  <3 RBC/hpf   Bacteria, UA MANY (*) RARE  POC URINE PREG, ED      Result Value Ref Range   Preg Test, Ur NEGATIVE  NEGATIVE   No results found.  MDM  Plan rx norco, zofran, bactrim ds. Urine for culture f/u pmd if not improving 2-3 days or return Dx#1 acute pyelonephitis #2hypokalemia Final diagnoses:  None        Orlie Dakin, MD 07/17/13 2153

## 2013-07-17 NOTE — ED Notes (Signed)
Per pt, states lower back pain and pressure-urinary frequency

## 2013-07-17 NOTE — Discharge Instructions (Signed)
Pyelonephritis, Adult See your primary care physician or kidney specialist if not improving in 2 or 3 days. Return if pain not controlled , for vomiting after taking the medications prescribed or if you feel worse for any reason Pyelonephritis is a kidney infection. A kidney infection can happen quickly, or it can last for a long time. HOME CARE   Take your medicine (antibiotics) as told. Finish it even if you start to feel better.  Keep all doctor visits as told.  Drink enough fluids to keep your pee (urine) clear or pale yellow.  Only take medicine as told by your doctor. GET HELP RIGHT AWAY IF:   You have a fever or lasting symptoms for more than 2-3 days.  You have a fever and your symptoms suddenly get worse.  You cannot take your medicine or drink fluids as told.  You have chills and shaking.  You feel very weak or pass out (faint).  You do not feel better after 2 days. MAKE SURE YOU:  Understand these instructions.  Will watch your condition.  Will get help right away if you are not doing well or get worse. Document Released: 03/31/2004 Document Revised: 08/23/2011 Document Reviewed: 08/11/2010 [facility] Patient Information 2014 [place], [place].

## 2013-07-20 LAB — URINE CULTURE
Colony Count: >=100000
Special Requests: NORMAL

## 2013-07-21 ENCOUNTER — Telehealth (HOSPITAL_BASED_OUTPATIENT_CLINIC_OR_DEPARTMENT_OTHER): Payer: Self-pay | Admitting: Emergency Medicine

## 2013-07-21 NOTE — Telephone Encounter (Signed)
Post ED Visit - Positive Culture Follow-up  Culture report reviewed by antimicrobial stewardship pharmacist: []  Wes Lima, Pharm.D., BCPS []  Heide Guile, Pharm.D., BCPS []  Alycia Rossetti, Pharm.D., BCPS []  [place], Pharm.D., BCPS, AAHIVP []  Legrand Como, Pharm.D., BCPS, AAHIVP []  Juliene Pina, Pharm.D. [x]  Salome Arnt, Pharm.D  Positive urine culture Treated with Sulfa-Trimeth, organism sensitive to the same and no further patient follow-up is required at this time.    07/21/2013, 3:02 PM

## 2013-07-22 ENCOUNTER — Emergency Department (HOSPITAL_COMMUNITY)
Admission: EM | Admit: 2013-07-22 | Discharge: 2013-07-22 | Disposition: A | Discharge status: Home / Self Care | Payer: Medicaid Other | Attending: Emergency Medicine | Admitting: Emergency Medicine

## 2013-07-22 ENCOUNTER — Encounter (HOSPITAL_COMMUNITY): Payer: Self-pay | Admitting: Emergency Medicine

## 2013-07-22 DIAGNOSIS — Z8742 Personal history of other diseases of the female genital tract: Secondary | ICD-10-CM | POA: Insufficient documentation

## 2013-07-22 DIAGNOSIS — R110 Nausea: Secondary | ICD-10-CM | POA: Insufficient documentation

## 2013-07-22 DIAGNOSIS — R109 Unspecified abdominal pain: Secondary | ICD-10-CM | POA: Insufficient documentation

## 2013-07-22 DIAGNOSIS — Z79899 Other long term (current) drug therapy: Secondary | ICD-10-CM | POA: Insufficient documentation

## 2013-07-22 DIAGNOSIS — Z8619 Personal history of other infectious and parasitic diseases: Secondary | ICD-10-CM | POA: Insufficient documentation

## 2013-07-22 DIAGNOSIS — F17200 Nicotine dependence, unspecified, uncomplicated: Secondary | ICD-10-CM | POA: Insufficient documentation

## 2013-07-22 DIAGNOSIS — Z87440 Personal history of urinary (tract) infections: Secondary | ICD-10-CM | POA: Insufficient documentation

## 2013-07-22 DIAGNOSIS — G8929 Other chronic pain: Secondary | ICD-10-CM | POA: Insufficient documentation

## 2013-07-22 DIAGNOSIS — Z3202 Encounter for pregnancy test, result negative: Secondary | ICD-10-CM | POA: Insufficient documentation

## 2013-07-22 LAB — URINALYSIS, ROUTINE W REFLEX MICROSCOPIC
BILIRUBIN URINE: NEGATIVE
Glucose, UA: NEGATIVE mg/dL
Hgb urine dipstick: NEGATIVE
Ketones, ur: NEGATIVE mg/dL
Nitrite: NEGATIVE
Protein, ur: NEGATIVE mg/dL
SPECIFIC GRAVITY, URINE: 1.03 (ref 1.005–1.030)
UROBILINOGEN UA: 1 mg/dL (ref 0.0–1.0)
pH: 7 (ref 5.0–8.0)

## 2013-07-22 LAB — URINE MICROSCOPIC-ADD ON

## 2013-07-22 LAB — POC URINE PREG, ED: PREG TEST UR: NEGATIVE

## 2013-07-22 MED ORDER — OXYCODONE-ACETAMINOPHEN 5-325 MG PO TABS
1.0000 | ORAL_TABLET | Freq: Once | ORAL | Status: AC
  Administered 2013-07-22: 1 via ORAL
  Filled 2013-07-22: qty 1

## 2013-07-22 MED ORDER — PHENAZOPYRIDINE HCL 200 MG PO TABS
200.0000 mg | ORAL_TABLET | Freq: Three times a day (TID) | ORAL | Status: DC
  Filled 2013-07-22: qty 1

## 2013-07-22 MED ORDER — OXYCODONE-ACETAMINOPHEN 5-325 MG PO TABS: 1.0000 | ORAL_TABLET | ORAL | Status: DC | PRN

## 2013-07-22 NOTE — Discharge Instructions (Signed)
Chronic Pain Chronic pain can be defined as pain that is off and on and lasts for 3 6 months or longer. Many things cause chronic pain, which can make it difficult to make a diagnosis. There are many treatment options available for chronic pain. However, finding a treatment that works well for you may require trying various approaches until the right one is found. Many people benefit from a combination of two or more types of treatment to control their pain. SYMPTOMS  Chronic pain can occur anywhere in the body and can range from mild to very severe. Some types of chronic pain include:  Headache.  Low back pain.  Cancer pain.  Arthritis pain.  Neurogenic pain. This is pain resulting from damage to nerves. People with chronic pain may also have other symptoms such as:  Depression.  Anger.  Insomnia.  Anxiety. DIAGNOSIS  Your health care provider will help diagnose your condition over time. In many cases, the initial focus will be on excluding possible conditions that could be causing the pain. Depending on your symptoms, your health care provider may order tests to diagnose your condition. Some of these tests may include:   Blood tests.   CT scan.   MRI.   X-rays.   Ultrasounds.   Nerve conduction studies.  You may need to see a specialist.  TREATMENT  Finding treatment that works well may take time. You may be referred to a pain specialist. He or she may prescribe medicine or therapies, such as:   Mindful meditation or yoga.  Shots (injections) of numbing or pain-relieving medicines into the spine or area of pain.  Local electrical stimulation.  Acupuncture.   Massage therapy.   Aroma, color, light, or sound therapy.   Biofeedback.   Working with a physical therapist to keep from getting stiff.   Regular, gentle exercise.   Cognitive or behavioral therapy.   Group support.  Sometimes, surgery may be recommended.  HOME CARE INSTRUCTIONS    Take all medicines as directed by your health care provider.   Lessen stress in your life by relaxing and doing things such as listening to calming music.   Exercise or be active as directed by your health care provider.   Eat a healthy diet and include things such as vegetables, fruits, fish, and lean meats in your diet.   Keep all follow-up appointments with your health care provider.   Attend a support group with others suffering from chronic pain. SEEK MEDICAL CARE IF:   Your pain gets worse.   You develop a new pain that was not there before.   You cannot tolerate medicines given to you by your health care provider.   You have new symptoms since your last visit with your health care provider.  SEEK IMMEDIATE MEDICAL CARE IF:   You feel weak.   You have decreased sensation or numbness.   You lose control of bowel or bladder function.   Your pain suddenly gets much worse.   You develop shaking.  You develop chills.  You develop confusion.  You develop chest pain.  You develop shortness of breath.  MAKE SURE YOU:  Understand these instructions.  Will watch your condition.  Will get help right away if you are not doing well or get worse. Document Released: 11/13/2001 Document Revised: 10/24/2012 Document Reviewed: 08/17/2012 [facility] Patient Information 2014 [place].  Flank Pain Flank pain refers to pain that is located on the side of the body between the upper  abdomen and the back. The pain may occur over a short period of time (acute) or may be long-term or reoccurring (chronic). It may be mild or severe. Flank pain can be caused by many things. CAUSES  Some of the more common causes of flank pain include:  Muscle strains.   Muscle spasms.   A disease of your spine (vertebral disk disease).   A lung infection (pneumonia).   Fluid around your lungs (pulmonary edema).   A kidney infection.   Kidney stones.   A very  painful skin rash caused by the chickenpox virus (shingles).   Gallbladder disease.  [place] care will depend on the cause of your pain. In general,  Rest as directed by your caregiver.  Drink enough fluids to keep your urine clear or pale yellow.  Only take over-the-counter or prescription medicines as directed by your caregiver. Some medicines may help relieve the pain.  Tell your caregiver about any changes in your pain.  Follow up with your caregiver as directed. SEEK IMMEDIATE MEDICAL CARE IF:   Your pain is not controlled with medicine.   You have new or worsening symptoms.  Your pain increases.   You have abdominal pain.   You have shortness of breath.   You have persistent nausea or vomiting.   You have swelling in your abdomen.   You feel faint or pass out.   You have blood in your urine.  You have a fever or persistent symptoms for more than 2 3 days.  You have a fever and your symptoms suddenly get worse. MAKE SURE YOU:   Understand these instructions.  Will watch your condition.  Will get help right away if you are not doing well or get worse. Document Released: 04/14/2005 Document Revised: 11/16/2011 Document Reviewed: 10/06/2011 [facility] Patient Information 2014 [place].

## 2013-07-22 NOTE — ED Provider Notes (Signed)
CSN: [id number]     Arrival date & time 07/22/13  1934 History   First MD Initiated Contact with Patient 07/22/13 2103     Chief Complaint  Patient presents with  . Flank Pain     (Consider location/radiation/quality/duration/timing/severity/associated sxs/prior Treatment) HPI Comments: Patient returns today with complaints of continued right flank pain - patient states she has a history of "kidney infections" as well as interstitial cystitis - she states she has taken her antibiotics (she was seen 5 days ago for same) and pain medication but states that she continues with the severe 10/10 right flank pain - she denies blood in urine but reports burning with urination - she states she has not followed up with urology despite several visits to the ED and an admission for pyelonephritis and abscess formation - she denies fever, chills, but reports nausea without vomiting.  She denies any gross hematuria at this time.  Patient is a 22 y.o. female presenting with flank pain. The history is provided by the patient. No language interpreter was used.  Flank Pain This is a chronic problem. The current episode started in the past 7 days. The problem occurs constantly. The problem has been unchanged. Associated symptoms include nausea and urinary symptoms. Pertinent negatives include no abdominal pain, anorexia, chest pain, chills, diaphoresis, fever or vomiting. Nothing aggravates the symptoms. She has tried nothing for the symptoms. The treatment provided no relief.    Past Medical History  Diagnosis Date  . Interstitial cystitis   . Protein in urine   . Abnormal Pap smear   . Ovarian cyst   . IC (interstitial cystitis) 04/17/2012  . GBS (group B streptococcus) UTI complicating pregnancy 5/62/1308   Past Surgical History  Procedure Laterality Date  . Dilation and curettage of uterus     Family History  Problem Relation Age of Onset  . Cancer Maternal Uncle   . Other Neg Hx   . Diabetes  Maternal Grandmother   . Cancer Maternal Grandmother     colon  . Diabetes Paternal Grandmother   . Cancer Paternal Grandmother     bone   History  Substance Use Topics  . Smoking status: Current Every Day Smoker -- 1.00 packs/day    Types: Cigarettes  . Smokeless tobacco: Never Used  . Alcohol Use: No     Comment: occ   OB History   Grav Para Term Preterm Abortions TAB SAB Ect Mult Living   2 1 1  1  1   1      Review of Systems  Constitutional: Negative for fever, chills and diaphoresis.  Cardiovascular: Negative for chest pain.  Gastrointestinal: Positive for nausea. Negative for vomiting, abdominal pain and anorexia.  Genitourinary: Positive for flank pain.  All other systems reviewed and are negative.     Allergies  Clarithromycin  Home Medications   Prior to Admission medications   Medication Sig Start Date End Date Taking? Authorizing Provider  acetaminophen (TYLENOL) 500 MG tablet Take 500-1,000 mg by mouth every 6 (six) hours as needed for mild pain or headache.    Yes Historical Provider, MD  etonogestrel (IMPLANON) 68 MG IMPL implant Inject 1 each into the skin once.   Yes Historical Provider, MD  HYDROcodone-acetaminophen (NORCO) 5-325 MG per tablet Take 1-2 tablets by mouth every 6 (six) hours as needed for severe pain. 07/17/13  Yes Orlie Dakin, MD  ibuprofen (ADVIL,MOTRIN) 800 MG tablet Take 800 mg by mouth every 8 (eight) hours as needed for  headache, mild pain or moderate pain.    Yes Historical Provider, MD  ondansetron (ZOFRAN) 8 MG tablet Take 1 tablet (8 mg total) by mouth every 8 (eight) hours as needed for nausea or vomiting. 07/17/13  Yes Orlie Dakin, MD  sulfamethoxazole-trimethoprim (SEPTRA DS) 800-160 MG per tablet Take 1 tablet by mouth 2 (two) times daily. 07/17/13  Yes Orlie Dakin, MD  traMADol (ULTRAM) 50 MG tablet Take 50 mg by mouth every 6 (six) hours as needed for moderate pain.   Yes Historical Provider, MD   BP 107/68  Pulse 94   Temp(Src) 98.5 F (36.9 C) (Oral)  Resp 16  Ht 5\' 5"  (1.651 m)  Wt 121 lb (54.885 kg)  BMI 20.14 kg/m2  SpO2 100%  LMP 07/14/2013  Breastfeeding? No Physical Exam  Nursing note and vitals reviewed. Constitutional: She is oriented to person, place, and time. She appears well-developed and well-nourished. No distress.  HENT:  Head: Normocephalic and atraumatic.  Right Ear: External ear normal.  Left Ear: External ear normal.  Nose: Nose normal.  Mouth/Throat: Oropharynx is clear and moist. No oropharyngeal exudate.  Eyes: Conjunctivae are normal. Pupils are equal, round, and reactive to light. No scleral icterus.  Neck: Normal range of motion. Neck supple.  Cardiovascular: Normal rate, regular rhythm and normal heart sounds.  Exam reveals no gallop and no friction rub.   No murmur heard. Pulmonary/Chest: Breath sounds normal. No respiratory distress. She has no wheezes. She has no rales. She exhibits no tenderness.  Abdominal: Soft. Bowel sounds are normal. She exhibits no distension. There is tenderness. There is no rebound and no guarding.  Mild suprapubic tenderness to palpation - right CVA tenderness  Musculoskeletal: Normal range of motion. She exhibits no edema and no tenderness.  Lymphadenopathy:    She has no cervical adenopathy.  Neurological: She is alert and oriented to person, place, and time. She exhibits normal muscle tone. Coordination normal.  Skin: Skin is warm and dry. No rash noted. No erythema. No pallor.  Psychiatric: She has a normal mood and affect. Her behavior is normal. Judgment and thought content normal.    ED Course  Procedures (including critical care time) Labs Review Labs Reviewed  URINALYSIS, ROUTINE W REFLEX MICROSCOPIC - Abnormal; Notable for the following:    APPearance CLOUDY (*)    Leukocytes, UA SMALL (*)    All other components within normal limits  URINE MICROSCOPIC-ADD ON - Abnormal; Notable for the following:    Squamous Epithelial /  LPF MANY (*)    Bacteria, UA FEW (*)    All other components within normal limits  URINE CULTURE  POC URINE PREG, ED    Imaging Review No results found.   EKG Interpretation None      Results for orders placed during the hospital encounter of 07/22/13  URINALYSIS, ROUTINE W REFLEX MICROSCOPIC      Result Value Ref Range   Color, Urine YELLOW  YELLOW   APPearance CLOUDY (*) CLEAR   Specific Gravity, Urine 1.030  1.005 - 1.030   pH 7.0  5.0 - 8.0   Glucose, UA NEGATIVE  NEGATIVE mg/dL   Hgb urine dipstick NEGATIVE  NEGATIVE   Bilirubin Urine NEGATIVE  NEGATIVE   Ketones, ur NEGATIVE  NEGATIVE mg/dL   Protein, ur NEGATIVE  NEGATIVE mg/dL   Urobilinogen, UA 1.0  0.0 - 1.0 mg/dL   Nitrite NEGATIVE  NEGATIVE   Leukocytes, UA SMALL (*) NEGATIVE  URINE MICROSCOPIC-ADD ON  Result Value Ref Range   Squamous Epithelial / LPF MANY (*) RARE   WBC, UA 3-6  <3 WBC/hpf   Bacteria, UA FEW (*) RARE  POC URINE PREG, ED      Result Value Ref Range   Preg Test, Ur NEGATIVE  NEGATIVE   No results found.    MDM   Chronic right flank pain  Patient here with continued right flank pain despite now with clear urine (believe this to be more contaminated), she was previously placed on abx (bactrim and based on culture she was susceptible), she is afebrile and shows no clinical signs to suggest pyelonephritis.  She has not followed up with an urologist but I suspect this to be more chronic pain.    Idalia Needle Joelyn Oms, [place] 07/22/13 2305

## 2013-07-22 NOTE — ED Notes (Signed)
Pt states that she was seen here on 5/13 and diagnosed with a kidney infection; pt states that the pain medication and antibiotics have not helped and that she feels worse; pt c/o rt flank pain with mild left flank pain; pt c/o burning and pain upon urination and urinary frequency; pt c/o nausea

## 2013-07-23 NOTE — ED Provider Notes (Signed)
Medical screening examination/treatment/procedure(s) were performed by non-physician practitioner and as supervising physician I was immediately available for consultation/collaboration.   EKG Interpretation None        Wandra Arthurs, MD 07/23/13 [phone]

## 2013-07-24 LAB — URINE CULTURE
COLONY COUNT: NO GROWTH
Culture: NO GROWTH

## 2013-08-06 ENCOUNTER — Encounter (HOSPITAL_COMMUNITY): Payer: Self-pay | Admitting: Emergency Medicine

## 2013-08-06 ENCOUNTER — Emergency Department (HOSPITAL_COMMUNITY)
Admission: EM | Admit: 2013-08-06 | Discharge: 2013-08-06 | Disposition: A | Discharge status: Home / Self Care | Payer: Medicaid Other | Attending: Emergency Medicine | Admitting: Emergency Medicine

## 2013-08-06 DIAGNOSIS — Z87440 Personal history of urinary (tract) infections: Secondary | ICD-10-CM | POA: Insufficient documentation

## 2013-08-06 DIAGNOSIS — Z3202 Encounter for pregnancy test, result negative: Secondary | ICD-10-CM | POA: Insufficient documentation

## 2013-08-06 DIAGNOSIS — R3915 Urgency of urination: Secondary | ICD-10-CM | POA: Insufficient documentation

## 2013-08-06 DIAGNOSIS — Z791 Long term (current) use of non-steroidal anti-inflammatories (NSAID): Secondary | ICD-10-CM | POA: Insufficient documentation

## 2013-08-06 DIAGNOSIS — R112 Nausea with vomiting, unspecified: Secondary | ICD-10-CM | POA: Insufficient documentation

## 2013-08-06 DIAGNOSIS — M549 Dorsalgia, unspecified: Secondary | ICD-10-CM | POA: Insufficient documentation

## 2013-08-06 DIAGNOSIS — Z79899 Other long term (current) drug therapy: Secondary | ICD-10-CM | POA: Insufficient documentation

## 2013-08-06 DIAGNOSIS — R350 Frequency of micturition: Secondary | ICD-10-CM | POA: Insufficient documentation

## 2013-08-06 DIAGNOSIS — R3919 Other difficulties with micturition: Secondary | ICD-10-CM | POA: Insufficient documentation

## 2013-08-06 DIAGNOSIS — Z8619 Personal history of other infectious and parasitic diseases: Secondary | ICD-10-CM | POA: Insufficient documentation

## 2013-08-06 DIAGNOSIS — R109 Unspecified abdominal pain: Secondary | ICD-10-CM | POA: Insufficient documentation

## 2013-08-06 DIAGNOSIS — F17200 Nicotine dependence, unspecified, uncomplicated: Secondary | ICD-10-CM | POA: Insufficient documentation

## 2013-08-06 DIAGNOSIS — R319 Hematuria, unspecified: Secondary | ICD-10-CM | POA: Insufficient documentation

## 2013-08-06 HISTORY — DX: Renal and perinephric abscess: N15.1

## 2013-08-06 LAB — BASIC METABOLIC PANEL WITH GFR
BUN: 11 mg/dL (ref 6–23)
CO2: 23 meq/L (ref 19–32)
Calcium: 9.6 mg/dL (ref 8.4–10.5)
Chloride: 103 meq/L (ref 96–112)
Creatinine, Ser: 0.67 mg/dL (ref 0.50–1.10)
GFR calc Af Amer: >90 mL/min
GFR calc non Af Amer: >90 mL/min
Glucose, Bld: 97 mg/dL (ref 70–99)
Potassium: 3.6 meq/L — ABNORMAL LOW (ref 3.7–5.3)
Sodium: 138 meq/L (ref 137–147)

## 2013-08-06 LAB — URINALYSIS, ROUTINE W REFLEX MICROSCOPIC
Bilirubin Urine: NEGATIVE
Glucose, UA: NEGATIVE mg/dL
Ketones, ur: NEGATIVE mg/dL
Nitrite: NEGATIVE
Protein, ur: NEGATIVE mg/dL
Specific Gravity, Urine: 1.018 (ref 1.005–1.030)
Urobilinogen, UA: 1 mg/dL (ref 0.0–1.0)
pH: 7.5 (ref 5.0–8.0)

## 2013-08-06 LAB — CBC WITH DIFFERENTIAL/PLATELET
Basophils Absolute: 0 K/uL (ref 0.0–0.1)
Basophils Relative: 0 % (ref 0–1)
Eosinophils Absolute: 0.1 K/uL (ref 0.0–0.7)
Eosinophils Relative: 1 % (ref 0–5)
HCT: 40.1 % (ref 36.0–46.0)
Hemoglobin: 13.8 g/dL (ref 12.0–15.0)
Lymphocytes Relative: 33 % (ref 12–46)
Lymphs Abs: 2.8 K/uL (ref 0.7–4.0)
MCH: 33.3 pg (ref 26.0–34.0)
MCHC: 34.4 g/dL (ref 30.0–36.0)
MCV: 96.9 fL (ref 78.0–100.0)
Monocytes Absolute: 0.5 K/uL (ref 0.1–1.0)
Monocytes Relative: 6 % (ref 3–12)
Neutro Abs: 5.1 K/uL (ref 1.7–7.7)
Neutrophils Relative %: 60 % (ref 43–77)
Platelets: 247 K/uL (ref 150–400)
RBC: 4.14 MIL/uL (ref 3.87–5.11)
RDW: 12.6 % (ref 11.5–15.5)
WBC: 8.5 K/uL (ref 4.0–10.5)

## 2013-08-06 LAB — URINE MICROSCOPIC-ADD ON

## 2013-08-06 LAB — POC URINE PREG, ED: Preg Test, Ur: NEGATIVE

## 2013-08-06 MED ORDER — SULFAMETHOXAZOLE-TMP DS 800-160 MG PO TABS
1.0000 | ORAL_TABLET | Freq: Once | ORAL | Status: AC
  Administered 2013-08-06: 1 via ORAL
  Filled 2013-08-06: qty 1

## 2013-08-06 MED ORDER — NAPROXEN 500 MG PO TABS: 500.0000 mg | ORAL_TABLET | Freq: Two times a day (BID) | ORAL | Status: DC

## 2013-08-06 MED ORDER — ONDANSETRON HCL 4 MG/2ML IJ SOLN
4.0000 mg | INTRAMUSCULAR | Status: AC
  Administered 2013-08-06: 4 mg via INTRAVENOUS
  Filled 2013-08-06: qty 2

## 2013-08-06 MED ORDER — SODIUM CHLORIDE 0.9 % IV BOLUS (SEPSIS)
1000.0000 mL | Freq: Once | INTRAVENOUS | Status: AC
  Administered 2013-08-06: 1000 mL via INTRAVENOUS

## 2013-08-06 MED ORDER — SULFAMETHOXAZOLE-TRIMETHOPRIM 800-160 MG PO TABS: 1.0000 | ORAL_TABLET | Freq: Two times a day (BID) | ORAL | Status: AC

## 2013-08-06 MED ORDER — KETOROLAC TROMETHAMINE 30 MG/ML IJ SOLN
30.0000 mg | Freq: Once | INTRAMUSCULAR | Status: AC
  Administered 2013-08-06: 30 mg via INTRAVENOUS
  Filled 2013-08-06: qty 1

## 2013-08-06 NOTE — ED Provider Notes (Signed)
CSN: [id number]     Arrival date & time 08/06/13  0032 History   First MD Initiated Contact with Patient 08/06/13 0117     Chief Complaint  Patient presents with  . Back Pain  . Abdominal Pain     (Consider location/radiation/quality/duration/timing/severity/associated sxs/prior Treatment) HPI Comments: Patient is a 23 year old female with a history of interstitial cystitis, renal abscess, and ovarian cysts who presents to the emergency department for right flank pain and left suprapubic pain. Patient states that symptoms began 3 days ago as pain in her right flank. She states that pain travels around to her right lower quadrant and has now become present in her left suprapubic region. Patient has taken ibuprofen for symptoms as well as Vicodin with little relief. She states that pain is worse with palpation to the area. Symptoms have been associated with small amounts of hematuria. She describes this as urine specked with bright red blood. She also endorses a urinary urgency and hesitancy with decreased urinary stream. She further endorses experiencing one episode of emesis yesterday which was nonbloody. Patient denies associated fever, chest pain, shortness of breath, diarrhea, melena or hematochezia, vaginal bleeding, vaginal discharge, numbness/tingling, and weakness.   Last menstrual period was 07/14/2013. She states she is not currently menstruating. She states she has follow up with her urologist in 2 weeks. She was last treated for UTI on 07/21/13 with Bactrim.  Patient is a 23 y.o. female presenting with back pain and abdominal pain. The history is provided by the patient. No language interpreter was used.  Back Pain Associated symptoms: abdominal pain   Associated symptoms: no chest pain, no dysuria, no fever, no numbness and no weakness   Abdominal Pain Associated symptoms: hematuria, nausea and vomiting   Associated symptoms: no chest pain, no diarrhea, no dysuria, no fever, no  shortness of breath, no vaginal bleeding and no vaginal discharge     Past Medical History  Diagnosis Date  . Interstitial cystitis   . Protein in urine   . Abnormal Pap smear   . Ovarian cyst   . IC (interstitial cystitis) 04/17/2012  . GBS (group B streptococcus) UTI complicating pregnancy 6/76/7209  . Renal abscess    Past Surgical History  Procedure Laterality Date  . Dilation and curettage of uterus     Family History  Problem Relation Age of Onset  . Cancer Maternal Uncle   . Other Neg Hx   . Diabetes Maternal Grandmother   . Cancer Maternal Grandmother     colon  . Diabetes Paternal Grandmother   . Cancer Paternal Grandmother     bone   History  Substance Use Topics  . Smoking status: Current Every Day Smoker -- 0.50 packs/day    Types: Cigarettes  . Smokeless tobacco: Never Used  . Alcohol Use: No     Comment: occ   OB History   Grav Para Term Preterm Abortions TAB SAB Ect Mult Living   2 1 1  1  1   1      Review of Systems  Constitutional: Negative for fever.  Respiratory: Negative for shortness of breath.   Cardiovascular: Negative for chest pain.  Gastrointestinal: Positive for nausea, vomiting and abdominal pain. Negative for diarrhea and blood in stool.  Genitourinary: Positive for urgency, frequency, hematuria and decreased urine volume. Negative for dysuria, vaginal bleeding and vaginal discharge.  Musculoskeletal: Positive for back pain.  Neurological: Negative for weakness and numbness.  All other systems reviewed and are negative.  Allergies  Clarithromycin  Home Medications   Prior to Admission medications   Medication Sig Start Date End Date Taking? Authorizing Provider  etonogestrel (IMPLANON) 68 MG IMPL implant Inject 1 each into the skin once.   Yes Historical Provider, MD  HYDROcodone-acetaminophen (NORCO) 5-325 MG per tablet Take 1-2 tablets by mouth every 6 (six) hours as needed for severe pain. 07/17/13  Yes Orlie Dakin, MD   ibuprofen (ADVIL,MOTRIN) 800 MG tablet Take 800 mg by mouth every 8 (eight) hours as needed for headache, mild pain or moderate pain.    Yes Historical Provider, MD  traMADol (ULTRAM) 50 MG tablet Take 50 mg by mouth every 6 (six) hours as needed for moderate pain.   Yes Historical Provider, MD  naproxen (NAPROSYN) 500 MG tablet Take 1 tablet (500 mg total) by mouth 2 (two) times daily. 08/06/13   Antonietta Breach, PA-C  ondansetron (ZOFRAN) 8 MG tablet Take 1 tablet (8 mg total) by mouth every 8 (eight) hours as needed for nausea or vomiting. 07/17/13   Orlie Dakin, MD  sulfamethoxazole-trimethoprim (BACTRIM DS,SEPTRA DS) 800-160 MG per tablet Take 1 tablet by mouth 2 (two) times daily. 08/06/13 08/13/13  Antonietta Breach, PA-C   BP 108/58  Pulse 63  Temp(Src) 98.1 F (36.7 C) (Oral)  Resp 18  SpO2 100%  LMP 07/14/2013  Physical Exam  Nursing note and vitals reviewed. Constitutional: She is oriented to person, place, and time. She appears well-developed and well-nourished. No distress.  Nontoxic/nonseptic appearing. Appears uncomfortable.  HENT:  Head: Normocephalic and atraumatic.  Eyes: Conjunctivae and EOM are normal. No scleral icterus.  Neck: Normal range of motion.  Cardiovascular: Normal rate, regular rhythm and normal heart sounds.   Pulmonary/Chest: Effort normal. No respiratory distress. She has no wheezes. She has no rales.  Chest expansion symmetric.  Abdominal: Soft. She exhibits no distension and no mass. There is tenderness. There is no rebound and no guarding.  Abdomen soft without peritoneal signs. TTP in R flank and suprapubic region; R>L. No masses.   Musculoskeletal: Normal range of motion.  Neurological: She is alert and oriented to person, place, and time.  GCS 15. Patient moves extremities without ataxia.  Skin: Skin is warm and dry. No rash noted. She is not diaphoretic. No erythema. No pallor.  Psychiatric: She has a normal mood and affect. Her behavior is normal.     ED Course  Procedures (including critical care time) Labs Review Labs Reviewed  URINALYSIS, ROUTINE W REFLEX MICROSCOPIC - Abnormal; Notable for the following:    APPearance CLOUDY (*)    Hgb urine dipstick LARGE (*)    Leukocytes, UA MODERATE (*)    All other components within normal limits  URINE MICROSCOPIC-ADD ON - Abnormal; Notable for the following:    Squamous Epithelial / LPF MANY (*)    Bacteria, UA FEW (*)    All other components within normal limits  BASIC METABOLIC PANEL - Abnormal; Notable for the following:    Potassium 3.6 (*)    All other components within normal limits  URINE CULTURE  CBC WITH DIFFERENTIAL  POC URINE PREG, ED    Imaging Review No results found.   EKG Interpretation None      MDM   Final diagnoses:  Hematuria  Flank pain  Urgency of urination    Patient presents for flank and suprapubic pain x 3 days. Urinalysis and symptoms suggest UTI today. Patient was recently treated for this on 07/21/13. Abdominal reexaminations stable. No leukocytosis.  Kidney function stable. Do not believe further work up or imaging is indicated. Doubt torsion given chronicity of symptoms and mild to moderate abdominal tenderness. Urine pregnancy negative. Also doubt atypical PE given lack of tachycardia, tachypnea, dyspnea, and hypoxia. Will tx with Bactrim. Urine culture pending. Patient with significant hx of substance abuse; will give Naproxen only for pain control. Return precautions provided and patient agreeable to plan with no unaddressed concerns. Urology f/u stressed.   Filed Vitals:   08/06/13 0035 08/06/13 0316  BP: 127/71 108/58  Pulse: 105 63  Temp: 98.6 F (37 C) 98.1 F (36.7 C)  TempSrc: Oral Oral  Resp: 16 18  SpO2: 98% 100%       Antonietta Breach, PA-C 08/06/13 [phone]

## 2013-08-06 NOTE — ED Provider Notes (Signed)
Medical screening examination/treatment/procedure(s) were performed by non-physician practitioner and as supervising physician I was immediately available for consultation/collaboration.   EKG Interpretation None        Hoy Morn, MD 08/06/13 [phone]

## 2013-08-06 NOTE — ED Notes (Signed)
Per pt report: pt c/o lower back pain and abd pain on the right side that began 3 days ago.  Pt hx of UTI.  Pt reports some hesitancy when she needs to urinate. Pt took 800mg  ibuprofen at noon and 1 norco at 19:00 with little relief. Pt a/o x 4. Skin warm and dry.

## 2013-08-06 NOTE — Discharge Instructions (Signed)
Urinary Tract Infection  Urinary tract infections (UTIs) can develop anywhere along your urinary tract. Your urinary tract is your body's drainage system for removing wastes and extra water. Your urinary tract includes two kidneys, two ureters, a bladder, and a urethra. Your kidneys are a pair of bean-shaped organs. Each kidney is about the size of your fist. They are located below your ribs, one on each side of your spine.  CAUSES  Infections are caused by microbes, which are microscopic organisms, including fungi, viruses, and bacteria. These organisms are so small that they can only be seen through a microscope. Bacteria are the microbes that most commonly cause UTIs.  SYMPTOMS   Symptoms of UTIs may vary by age and gender of the patient and by the location of the infection. Symptoms in young women typically include a frequent and intense urge to urinate and a painful, burning feeling in the bladder or urethra during urination. Older women and men are more likely to be tired, shaky, and weak and have muscle aches and abdominal pain. A fever may mean the infection is in your kidneys. Other symptoms of a kidney infection include pain in your back or sides below the ribs, nausea, and vomiting.  DIAGNOSIS  To diagnose a UTI, your caregiver will ask you about your symptoms. Your caregiver also will ask to provide a urine sample. The urine sample will be tested for bacteria and white blood cells. White blood cells are made by your body to help fight infection.  TREATMENT   Typically, UTIs can be treated with medication. Because most UTIs are caused by a bacterial infection, they usually can be treated with the use of antibiotics. The choice of antibiotic and length of treatment depend on your symptoms and the type of bacteria causing your infection.  HOME CARE INSTRUCTIONS   If you were prescribed antibiotics, take them exactly as your caregiver instructs you. Finish the medication even if you feel better after you  have only taken some of the medication.   Drink enough water and fluids to keep your urine clear or pale yellow.   Avoid caffeine, tea, and carbonated beverages. They tend to irritate your bladder.   Empty your bladder often. Avoid holding urine for long periods of time.   Empty your bladder before and after sexual intercourse.   After a bowel movement, women should cleanse from front to back. Use each tissue only once.  SEEK MEDICAL CARE IF:    You have back pain.   You develop a fever.   Your symptoms do not begin to resolve within 3 days.  SEEK IMMEDIATE MEDICAL CARE IF:    You have severe back pain or lower abdominal pain.   You develop chills.   You have nausea or vomiting.   You have continued burning or discomfort with urination.  MAKE SURE YOU:    Understand these instructions.   Will watch your condition.   Will get help right away if you are not doing well or get worse.  Document Released: 12/01/2004 Document Revised: 08/23/2011 Document Reviewed: 04/01/2011  [employer] Patient Information 2014 [employer]

## 2013-08-07 LAB — URINE CULTURE
Colony Count: NO GROWTH
Culture: NO GROWTH

## 2014-01-06 ENCOUNTER — Encounter (HOSPITAL_COMMUNITY): Payer: Self-pay | Admitting: Emergency Medicine

## 2014-10-25 IMAGING — CT CT ABD-PELV W/ CM
2 of 4 series · 16 of 46 positions shown, 18 images · IV contrast (APPLIED)
Comparison: 05/11/2013

CLINICAL DATA: Increasing right flank pain.  Right renal abscess.

EXAM:
CT ABDOMEN AND PELVIS WITH CONTRAST
TECHNIQUE: Multidetector CT imaging of the abdomen and pelvis was performed
using the standard protocol following bolus administration of
intravenous contrast.
CONTRAST:  80mL OMNIPAQUE IOHEXOL 300 MG/ML  SOLN

[Series 2: abd/ pelvis 5.0 i30f 1 · axial · 0.68mm/px · z∈[+900,+1326]mm · 13 of 93 slices shown, 15 images]
[im 4/93  soft-tissue]
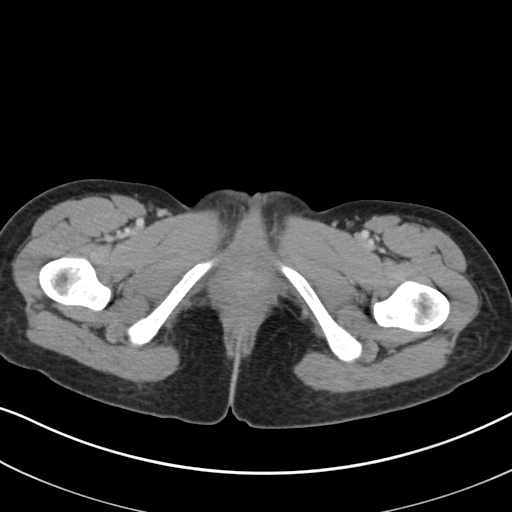
[im 4/93  bone]
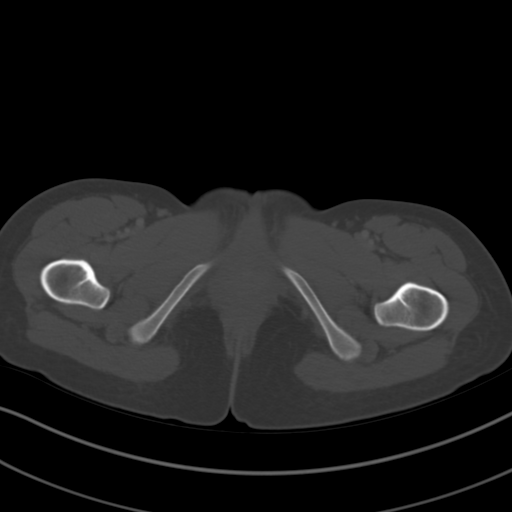
[im 12/93  soft-tissue]
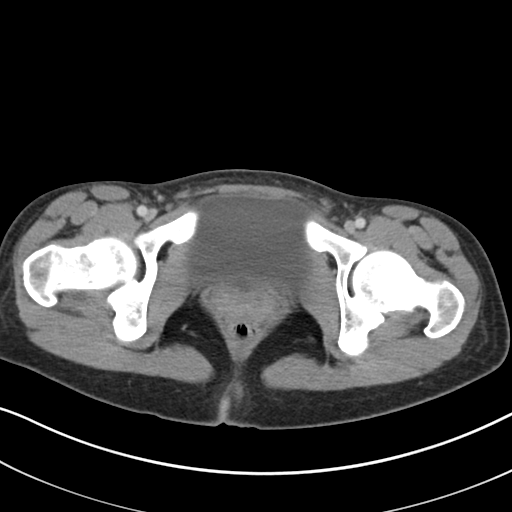
[im 20/93  soft-tissue]
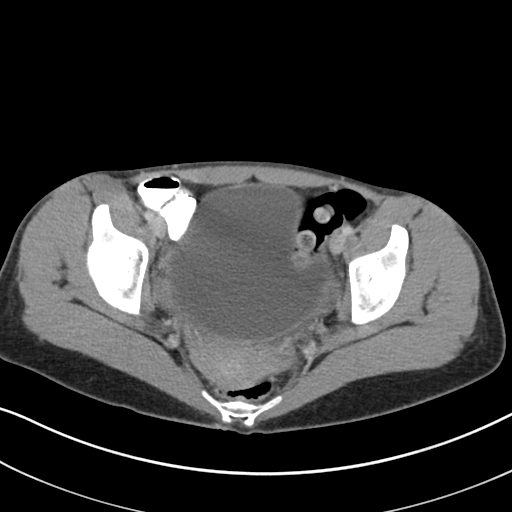
[im 27/93  soft-tissue]
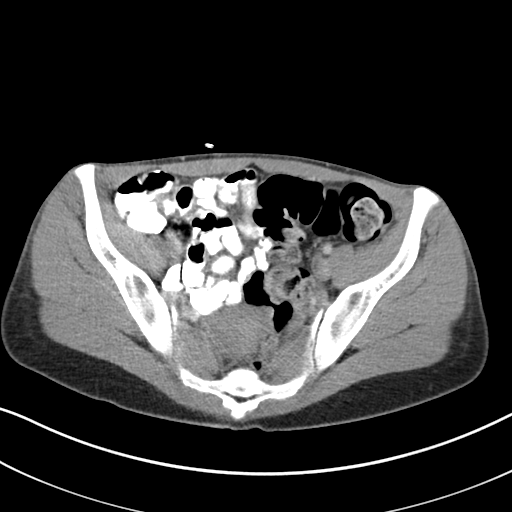
[im 31/93  soft-tissue]
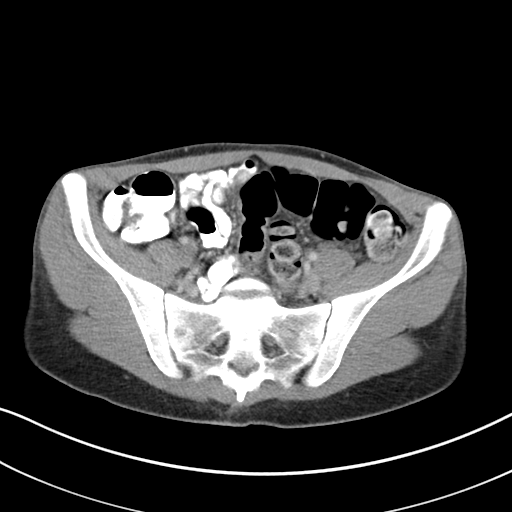
[im 39/93  soft-tissue]
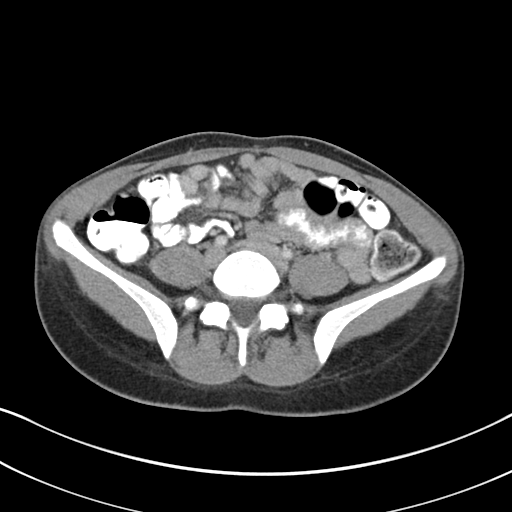
[im 47/93  soft-tissue]
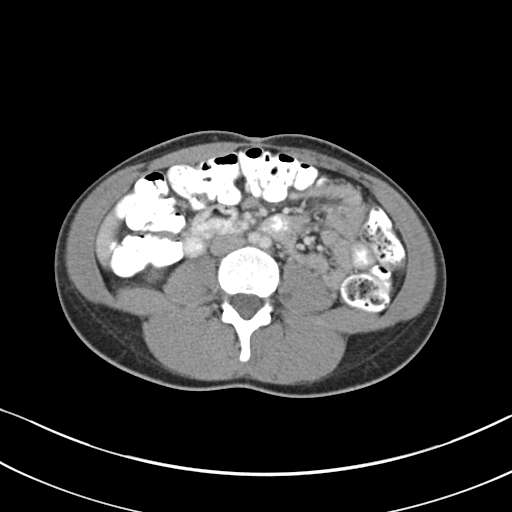
[im 54/93  soft-tissue]
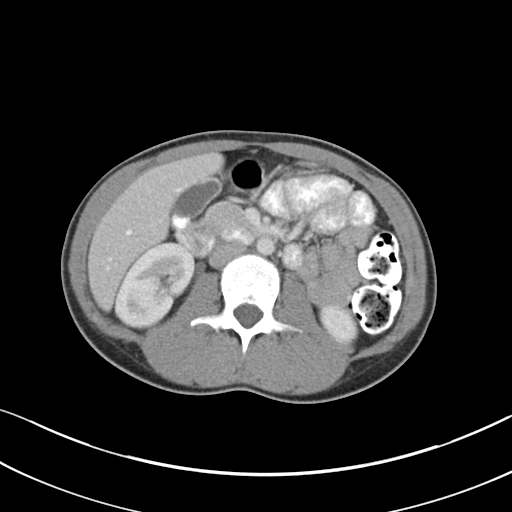
[im 62/93  soft-tissue]
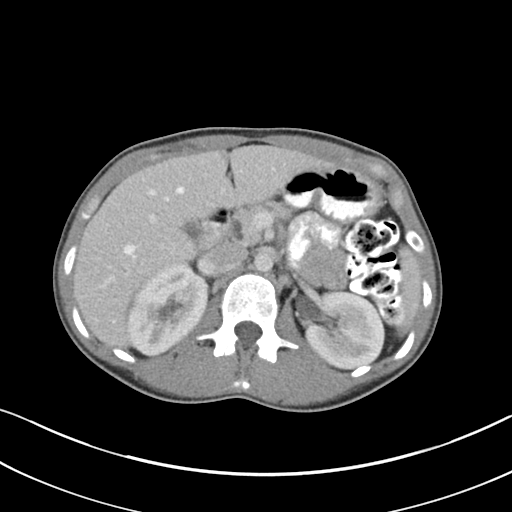
[im 62/93  bone]
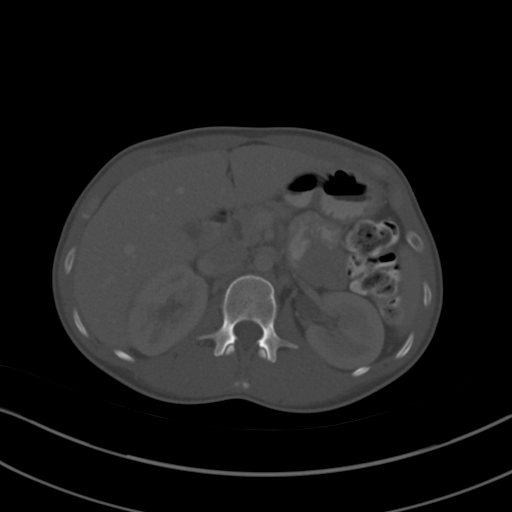
[im 66/93  soft-tissue]
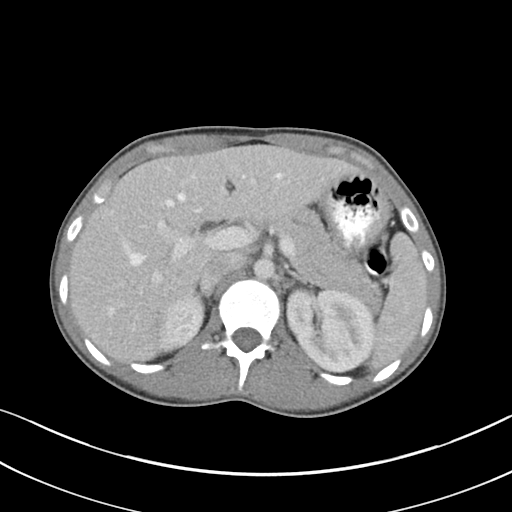
[im 73/93  soft-tissue]
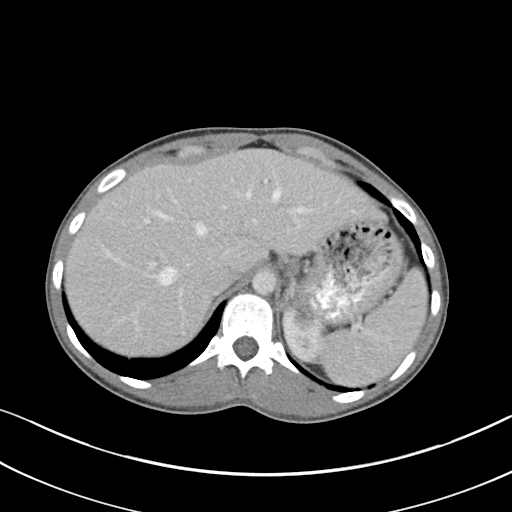
[im 81/93  soft-tissue]
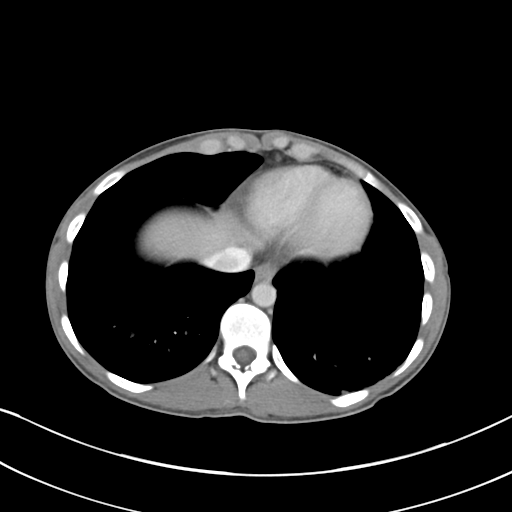
[im 89/93  soft-tissue]
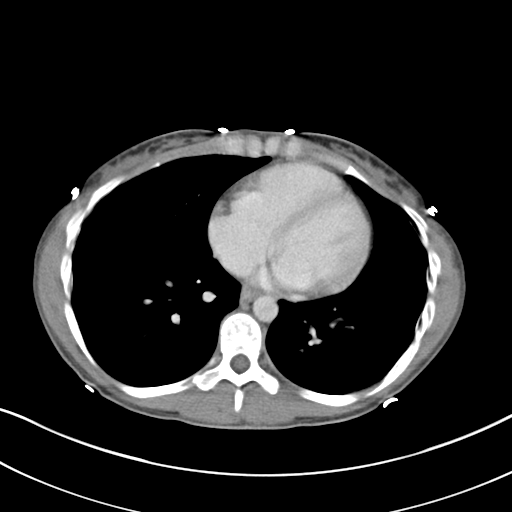

[Series 5: coronals · coronal · 0.70mm/px · 3 of 102 slices shown]
[im 34/102  soft-tissue]
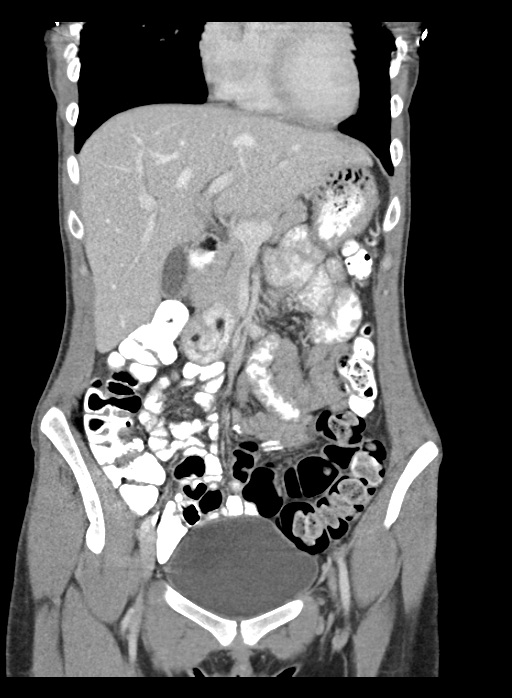
[im 45/102  soft-tissue]
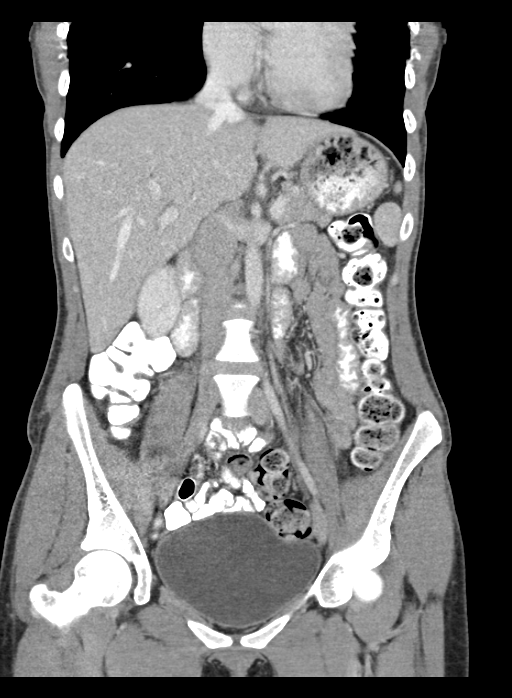
[im 57/102  soft-tissue]
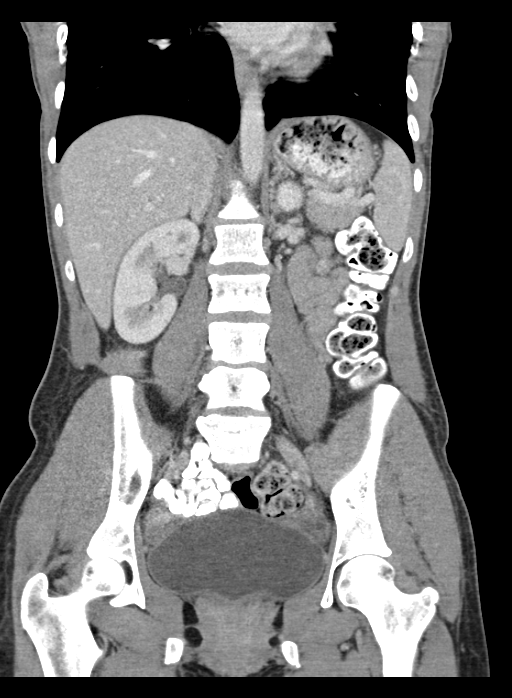

[16 of 46 positions shown; findings below may reference images not displayed]

FINDINGS: The right renal abscess has almost completely resolved and measuring
6 mm in diameter. There was a vague area of cortical decreased
density slightly inferior and lateral to the abscess which I think
represented focal pyelonephritis in that has almost completely
resolved.

There is a stable 16 mm cyst in the upper pole of the left kidney.
There is no hydronephrosis.

There is slight prominence of the biliary tree and pancreatic duct
without visible stones or masses. Liver parenchyma, spleen,
pancreas, and adrenal glands are normal. The bowel is normal. Uterus
and ovaries and bladder appear normal.
IMPRESSION: 1. Almost complete resolution of the right renal abscess. Almost
complete resolution of adjacent pyelonephritis.
2. No new acute abnormalities.
3. Slight prominence of the pancreatic duct and biliary tree of
unknown etiology. The patient has a normal bilirubin.

## 2015-03-26 ENCOUNTER — Other Ambulatory Visit (HOSPITAL_COMMUNITY)
Admission: RE | Admit: 2015-03-26 | Discharge: 2015-03-26 | Disposition: A | Discharge status: Home / Self Care | Payer: Medicaid Other | Source: Ambulatory Visit | Attending: Family Medicine | Admitting: Family Medicine

## 2015-03-26 ENCOUNTER — Other Ambulatory Visit (HOSPITAL_COMMUNITY): Admission: AD | Admit: 2015-03-26 | Payer: Self-pay | Source: Ambulatory Visit | Admitting: Family Medicine

## 2015-03-26 ENCOUNTER — Emergency Department (HOSPITAL_COMMUNITY)
Admission: EM | Admit: 2015-03-26 | Discharge: 2015-03-26 | Disposition: A | Discharge status: Home / Self Care | Payer: Medicaid Other | Source: Home / Self Care | Attending: Family Medicine | Admitting: Family Medicine

## 2015-03-26 ENCOUNTER — Encounter (HOSPITAL_COMMUNITY): Payer: Self-pay | Admitting: Emergency Medicine

## 2015-03-26 DIAGNOSIS — R110 Nausea: Secondary | ICD-10-CM

## 2015-03-26 DIAGNOSIS — T148XXA Other injury of unspecified body region, initial encounter: Secondary | ICD-10-CM

## 2015-03-26 DIAGNOSIS — M545 Low back pain, unspecified: Secondary | ICD-10-CM

## 2015-03-26 DIAGNOSIS — N390 Urinary tract infection, site not specified: Secondary | ICD-10-CM | POA: Diagnosis not present

## 2015-03-26 DIAGNOSIS — T148 Other injury of unspecified body region: Secondary | ICD-10-CM | POA: Diagnosis not present

## 2015-03-26 LAB — POCT URINALYSIS DIP (DEVICE)
BILIRUBIN URINE: NEGATIVE
Glucose, UA: NEGATIVE mg/dL
KETONES UR: NEGATIVE mg/dL
NITRITE: POSITIVE — AB
PH: 7 (ref 5.0–8.0)
PROTEIN: NEGATIVE mg/dL
Specific Gravity, Urine: 1.01 (ref 1.005–1.030)
Urobilinogen, UA: 0.2 mg/dL (ref 0.0–1.0)

## 2015-03-26 MED ORDER — CEPHALEXIN 500 MG PO CAPS: 500.0000 mg | ORAL_CAPSULE | Freq: Four times a day (QID) | ORAL | Status: DC

## 2015-03-26 MED ORDER — ONDANSETRON HCL 4 MG PO TABS: 4.0000 mg | ORAL_TABLET | Freq: Three times a day (TID) | ORAL | Status: DC | PRN

## 2015-03-26 NOTE — ED Notes (Signed)
Complains of uti, history of the same.  Patient reports burning with urination and pain in back for 3-4 days, yesterday and today has nausea

## 2015-03-26 NOTE — Discharge Instructions (Signed)
Urinary Tract Infection A culture of your urine will be performed. If changes in your antibiotic needed to be made we will call you. Start taking the Keflex 4 times a day today. You may take Pyridium, generic form over-the-counter. He would take to 95 mg tablets 3 times a day Drink plenty of fluids to flush out the infection Start taking the Zofran for nausea and vomiting. If after taking the medication your not feeling better or fever getting worse, developing fevers, vomiting, unable to hold down liquids pain is getting worse or other problems go directly to the emergency department. Urinary tract infections (UTIs) can develop anywhere along your urinary tract. Your urinary tract is your body's drainage system for removing wastes and extra water. Your urinary tract includes two kidneys, two ureters, a bladder, and a urethra. Your kidneys are a pair of bean-shaped organs. Each kidney is about the size of your fist. They are located below your ribs, one on each side of your spine. CAUSES Infections are caused by microbes, which are microscopic organisms, including fungi, viruses, and bacteria. These organisms are so small that they can only be seen through a microscope. Bacteria are the microbes that most commonly cause UTIs. SYMPTOMS  Symptoms of UTIs may vary by age and gender of the patient and by the location of the infection. Symptoms in young women typically include a frequent and intense urge to urinate and a painful, burning feeling in the bladder or urethra during urination. Older women and men are more likely to be tired, shaky, and weak and have muscle aches and abdominal pain. A fever may mean the infection is in your kidneys. Other symptoms of a kidney infection include pain in your back or sides below the ribs, nausea, and vomiting. DIAGNOSIS To diagnose a UTI, your caregiver will ask you about your symptoms. Your caregiver will also ask you to provide a urine sample. The urine sample will  be tested for bacteria and white blood cells. White blood cells are made by your body to help fight infection. TREATMENT  Typically, UTIs can be treated with medication. Because most UTIs are caused by a bacterial infection, they usually can be treated with the use of antibiotics. The choice of antibiotic and length of treatment depend on your symptoms and the type of bacteria causing your infection. HOME CARE INSTRUCTIONS  If you were prescribed antibiotics, take them exactly as your caregiver instructs you. Finish the medication even if you feel better after you have only taken some of the medication.  Drink enough water and fluids to keep your urine clear or pale yellow.  Avoid caffeine, tea, and carbonated beverages. They tend to irritate your bladder.  Empty your bladder often. Avoid holding urine for long periods of time.  Empty your bladder before and after sexual intercourse.  After a bowel movement, women should cleanse from front to back. Use each tissue only once. SEEK MEDICAL CARE IF:   You have back pain.  You develop a fever.  Your symptoms do not begin to resolve within 3 days. SEEK IMMEDIATE MEDICAL CARE IF:   You have severe back pain or lower abdominal pain.  You develop chills.  You have nausea or vomiting.  You have continued burning or discomfort with urination. MAKE SURE YOU:   Understand these instructions.  Will watch your condition.  Will get help right away if you are not doing well or get worse.   This information is not intended to replace advice given  to you by your health care provider. Make sure you discuss any questions you have with your health care provider.   Document Released: 12/01/2004 Document Revised: 11/12/2014 Document Reviewed: 04/01/2011 Elsevier Interactive Patient Education 2016 [employer].  Urine Culture and Sensitivity Testing WHY AM I HAVING THIS TEST?  A urine culture is a test to see if germs grow from your urine  sample. Normally, urine is free of germs (sterile). Germs in urine are usually bacteria. Sometimes they can be yeasts. These germs can cause a urinary tract infection (UTI). You may have this test if you have symptoms of a UTI. These may include:  Frequent urination.  Burning pain when passing urine. If you are pregnant, your health care provider may order this test to screen you for a UTI. When you pass urine, the urine flows through the tube that empties your bladder (urethra). In men, urine comes out through an opening at the tip of the penis. In women, it comes out of the body from just above the vaginal opening. These areas may have bacteria near them that normally live on the skin (normal flora). WHAT KIND OF SAMPLE IS TAKEN? A urine sample for a culture test must be collected in a way that keeps normal flora from getting into the sample. The method used most often is called a clean-catch sample. In a few cases, urine may need to be collected directly from the bladder using a thin, flexible tube (catheter). The health care provider puts the catheter through the person's urethra and into the bladder. Your urine sample will be placed onto plates containing a substance that encourages bacteria to grow (agar plates). These plates are kept at body temperature for 24-48 hours to see if bacteria or other germs grow. Then, a lab technician examines them under a microscope to check for germs. Any germs that grow from the culture will be tested against a variety of medicines to find the one that works best (sensitivity testing). For a UTI caused by bacteria, several types of antibiotic medicines may be tested. HOW DO I PREPARE FOR THE TEST?  Do not urinate for about an hour before collecting the sample.  Drink a glass of water about 20 minutes before collecting the sample.  Tell your health care provider if you have been taking antibiotics. This may affect the results of your test. Your health care  provider may give you sterile wipes to clean your vagina or penis to prepare for collecting a clean-catch sample. To collect the sample, you will need to do the following: For Women and Girls  Sit on the toilet and spread the lips of your vagina.  Use one wipe to clean your vaginal area from front to back.  Use a second wipe to clean the opening of your urethra.  Pass a small amount of urine directly into the toilet while still spreading your vagina.  Then, hold the sterile cup underneath you and urinate into it.  Fill the cup about halfway. Cap it and return it for testing. For Men and Boys  Use the sterile wipe to clean the tip of your penis.  Pass a small amount of urine directly into the toilet first.  Then, urinate into the sterile cup.  Fill the cup about halfway. Cap it and return it for testing. WHAT DO THE RESULTS MEAN? The result of a urine culture and sensitivity test will be positive or negative.   If enough bacteria grow from your urine sample, your  test result is considered positive.  If many different bacteria grow from your urine sample, your test may be reported as contaminated.  If no bacteria grow from your sample after 24-48 hours, your test result is considered negative.  Results of sensitivity testing let your health care provider know which medicines to use to treat your infection. If the results of your urine culture are negative, this means:  It is less likely that you have a UTI.  Your test may be repeated if you still have symptoms. If the results of your urine culture are positive, this means:  It is more likely that you have a UTI.  You may need to start treatment based on your sensitivity results. Talk to your health care provider to discuss your results, treatment options, and if necessary, the need for more tests. It is your responsibility to obtain your test results. Ask the lab or department performing the test when and how you will get  your results. Talk with your health care provider if you have any questions about your results.   This information is not intended to replace advice given to you by your health care provider. Make sure you discuss any questions you have with your health care provider.   Document Released: 03/18/2004 Document Revised: 03/14/2014 Document Reviewed: 06/20/2013 Elsevier Interactive Patient Education 2016 Elsevier Inc.  Nausea and Vomiting Nausea means you feel sick to your stomach. Throwing up (vomiting) is a reflex where stomach contents come out of your mouth. HOME CARE   Take medicine as told by your doctor.  Do not force yourself to eat. However, you do need to drink fluids.  If you feel like eating, eat a normal diet as told by your doctor.  Eat rice, wheat, potatoes, bread, lean meats, yogurt, fruits, and vegetables.  Avoid high-fat foods.  Drink enough fluids to keep your pee (urine) clear or pale yellow.  Ask your doctor how to replace body fluid losses (rehydrate). Signs of body fluid loss (dehydration) include:  Feeling very thirsty.  Dry lips and mouth.  Feeling dizzy.  Dark pee.  Peeing less than normal.  Feeling confused.  Fast breathing or heart rate. GET HELP RIGHT AWAY IF:   You have blood in your throw up.  You have black or bloody poop (stool).  You have a bad headache or stiff neck.  You feel confused.  You have bad belly (abdominal) pain.  You have chest pain or trouble breathing.  You do not pee at least once every 8 hours.  You have cold, clammy skin.  You keep throwing up after 24 to 48 hours.  You have a fever. MAKE SURE YOU:   Understand these instructions.  Will watch your condition.  Will get help right away if you are not doing well or get worse.   This information is not intended to replace advice given to you by your health care provider. Make sure you discuss any questions you have with your health care provider.     Document Released: 08/10/2007 Document Revised: 05/16/2011 Document Reviewed: 07/23/2010 Elsevier Interactive Patient Education [employer].

## 2015-03-26 NOTE — ED Provider Notes (Signed)
CSN: [id number]     Arrival date & time 03/26/15  1306 History   First MD Initiated Contact with Patient 03/26/15 1441     Chief Complaint  Patient presents with  . Urinary Tract Infection   (Consider location/radiation/quality/duration/timing/severity/associated sxs/prior Treatment) HPI Comments: 89 are O female complaining of dysuria for 3-4 days.Sudden onset, gradually getting worse.  Is associated with urinary frequency.  she is also complaining of pain across the lower most back over the lower para lumbosacral musculature. It is worse with bending. It started approximately 2 days ago. No no trauma, fall. Has to care for and lift child frequently. Patient denies fever or chills. She states she did have an episode of vomiting last night and once today. She is afebrile.. Patient has a history of interstitial cystitis, UTIs and pyelonephritis for which she has had to be admitted to the hospital.   Past Medical History  Diagnosis Date  . Interstitial cystitis   . Protein in urine   . Abnormal Pap smear   . Ovarian cyst   . IC (interstitial cystitis) 04/17/2012  . GBS (group B streptococcus) UTI complicating pregnancy [id number]  . Renal abscess    Past Surgical History  Procedure Laterality Date  . Dilation and curettage of uterus     Family History  Problem Relation Age of Onset  . Cancer Maternal Uncle   . Other Neg Hx   . Diabetes Maternal Grandmother   . Cancer Maternal Grandmother     colon  . Diabetes Paternal Grandmother   . Cancer Paternal Grandmother     bone   Social History  Substance Use Topics  . Smoking status: Current Every Day Smoker -- 0.50 packs/day    Types: Cigarettes  . Smokeless tobacco: Never Used  . Alcohol Use: No     Comment: occ   OB History    Gravida Para Term Preterm AB TAB SAB Ectopic Multiple Living   2 1 1  1  1   1      Review of Systems  Constitutional: Negative for fever and activity change.       Malaise  HENT: Negative.    Respiratory: Negative.   Gastrointestinal: Positive for vomiting. Negative for abdominal pain.  Genitourinary: Positive for dysuria, urgency and frequency. Negative for vaginal bleeding and vaginal discharge.       Pain to the mid and left suprapubic area.  Musculoskeletal: Positive for myalgias and back pain. Negative for neck pain.  Skin: Negative for rash.  Neurological: Negative.   All other systems reviewed and are negative.   Allergies  Clarithromycin  Home Medications   Prior to Admission medications   Medication Sig Start Date End Date Taking? Authorizing Provider  cephALEXin (KEFLEX) 500 MG capsule Take 1 capsule (500 mg total) by mouth 4 (four) times daily. 03/26/15   Janne Napoleon, NP  etonogestrel (IMPLANON) 68 MG IMPL implant Inject 1 each into the skin once.    Historical Provider, MD  HYDROcodone-acetaminophen (NORCO) 5-325 MG per tablet Take 1-2 tablets by mouth every 6 (six) hours as needed for severe pain. 07/17/13   Orlie Dakin, MD  ibuprofen (ADVIL,MOTRIN) 800 MG tablet Take 800 mg by mouth every 8 (eight) hours as needed for headache, mild pain or moderate pain.     Historical Provider, MD  naproxen (NAPROSYN) 500 MG tablet Take 1 tablet (500 mg total) by mouth 2 (two) times daily. 08/06/13   Antonietta Breach, PA-C  ondansetron (ZOFRAN) 4 MG tablet Take  1 tablet (4 mg total) by mouth every 8 (eight) hours as needed for nausea. May cause constipation 03/26/15   Janne Napoleon, NP  traMADol (ULTRAM) 50 MG tablet Take 50 mg by mouth every 6 (six) hours as needed for moderate pain.    Historical Provider, MD   Meds Ordered and Administered this Visit  Medications - No data to display  BP 160/79 mmHg  Pulse 102  Temp(Src) 98.1 F (36.7 C) (Oral)  Resp 18  SpO2 97% No data found.   Physical Exam  Constitutional: She appears well-developed and well-nourished. No distress.  Eyes: EOM are normal.  Neck: Normal range of motion. Neck supple.  Cardiovascular: Normal rate and  regular rhythm.   Pulmonary/Chest: Effort normal. No respiratory distress.  Abdominal: Soft. There is no tenderness. There is no rebound and no guarding.  Tenderness to the mid and left suprapubic area.  Musculoskeletal: She exhibits tenderness. She exhibits no edema.  Tenderness to the left and right parasacral and lumbar musculature. No spinal tenderness. No deformity, swelling or discoloration.  Lymphadenopathy:    She has no cervical adenopathy.  Neurological: She is alert. She exhibits normal muscle tone.  Skin: Skin is warm and dry. No erythema.  Psychiatric: She has a normal mood and affect.  Nursing note and vitals reviewed.   ED Course  Procedures (including critical care time)  Labs Review Labs Reviewed  POCT URINALYSIS DIP (DEVICE) - Abnormal; Notable for the following:    Hgb urine dipstick TRACE (*)    Nitrite POSITIVE (*)    Leukocytes, UA LARGE (*)    All other components within normal limits  URINE CULTURE   Results for orders placed or performed during the hospital encounter of 03/26/15  POCT urinalysis dip (device)  Result Value Ref Range   Glucose, UA NEGATIVE NEGATIVE mg/dL   Bilirubin Urine NEGATIVE NEGATIVE   Ketones, ur NEGATIVE NEGATIVE mg/dL   Specific Gravity, Urine 1.010 1.005 - 1.030   Hgb urine dipstick TRACE (A) NEGATIVE   pH 7.0 5.0 - 8.0   Protein, ur NEGATIVE NEGATIVE mg/dL   Urobilinogen, UA 0.2 0.0 - 1.0 mg/dL   Nitrite POSITIVE (A) NEGATIVE   Leukocytes, UA LARGE (A) NEGATIVE     Imaging Review No results found.   Visual Acuity Review  Right Eye Distance:   Left Eye Distance:   Bilateral Distance:    Right Eye Near:   Left Eye Near:    Bilateral Near:         MDM   1. UTI (lower urinary tract infection)   2. Nausea   3. Bilateral low back pain without sciatica   4. Muscle strain    Meds ordered this encounter  Medications  . cephALEXin (KEFLEX) 500 MG capsule    Sig: Take 1 capsule (500 mg total) by mouth 4  (four) times daily.    Dispense:  28 capsule    Refill:  0    Order Specific Question:  Supervising Provider    Answer:  Billy Fischer [phone]  . ondansetron (ZOFRAN) 4 MG tablet    Sig: Take 1 tablet (4 mg total) by mouth every 8 (eight) hours as needed for nausea. May cause constipation    Dispense:  20 tablet    Refill:  0    Order Specific Question:  Supervising Provider    Answer:  Ihor Gully D [phone]  AZO Standard OTC Lots of fluids Urine cult pending Urinary Tract Infection A culture of  your urine will be performed. If changes in your antibiotic needed to be made we will call you. Start taking the Keflex 4 times a day today. You may take Pyridium, generic form over-the-counter. He would take to 95 mg tablets 3 times a day Drink plenty of fluids to flush out the infection Start taking the Zofran for nausea and vomiting. If after taking the medication your not feeling better or fever getting worse, developing fevers, vomiting, unable to hold down liquids pain is getting worse or other problems go directly to the emergency department.    Janne Napoleon, NP 03/26/15 1515

## 2015-03-29 LAB — URINE CULTURE: Culture: >=100000

## 2015-05-14 ENCOUNTER — Encounter: Payer: Self-pay | Admitting: Advanced Practice Midwife

## 2015-05-14 ENCOUNTER — Encounter: Payer: Self-pay | Admitting: *Deleted

## 2015-06-03 ENCOUNTER — Encounter: Payer: Self-pay | Admitting: Advanced Practice Midwife

## 2015-06-03 ENCOUNTER — Ambulatory Visit (INDEPENDENT_AMBULATORY_CARE_PROVIDER_SITE_OTHER): Payer: Medicaid Other | Admitting: Advanced Practice Midwife

## 2015-06-03 VITALS — BP 100/70 | HR 60 | Ht 65.0 in | Wt 131.0 lb

## 2015-06-03 DIAGNOSIS — R350 Frequency of micturition: Secondary | ICD-10-CM

## 2015-06-03 DIAGNOSIS — R309 Painful micturition, unspecified: Secondary | ICD-10-CM | POA: Diagnosis not present

## 2015-06-03 DIAGNOSIS — Z3202 Encounter for pregnancy test, result negative: Secondary | ICD-10-CM

## 2015-06-03 DIAGNOSIS — Z3049 Encounter for surveillance of other contraceptives: Secondary | ICD-10-CM | POA: Diagnosis not present

## 2015-06-03 DIAGNOSIS — Z30017 Encounter for initial prescription of implantable subdermal contraceptive: Secondary | ICD-10-CM | POA: Insufficient documentation

## 2015-06-03 DIAGNOSIS — Z3046 Encounter for surveillance of implantable subdermal contraceptive: Secondary | ICD-10-CM | POA: Insufficient documentation

## 2015-06-03 LAB — POCT URINALYSIS DIPSTICK
Glucose, UA: NEGATIVE
KETONES UA: NEGATIVE
Leukocytes, UA: NEGATIVE
Nitrite, UA: POSITIVE

## 2015-06-03 LAB — POCT URINE PREGNANCY: Preg Test, Ur: NEGATIVE

## 2015-06-03 MED ORDER — SULFAMETHOXAZOLE-TRIMETHOPRIM 800-160 MG PO TABS: 1.0000 | ORAL_TABLET | Freq: Two times a day (BID) | ORAL | Status: DC

## 2015-06-03 NOTE — Addendum Note (Signed)
Addended by: Christin Fudge on: 06/03/2015 03:44 PM   Modules accepted: Orders

## 2015-06-03 NOTE — Progress Notes (Addendum)
Amber [name] 25 y.o. Filed Vitals:   06/03/15 1507  BP: 100/70  Pulse: 60    Past Medical History  Diagnosis Date  . Interstitial cystitis   . Protein in urine   . Abnormal Pap smear   . Ovarian cyst   . IC (interstitial cystitis) 04/17/2012  . GBS (group B streptococcus) UTI complicating pregnancy [id number]  . Renal abscess     Family History  Problem Relation Age of Onset  . Cancer Maternal Uncle   . Other Neg Hx   . Diabetes Maternal Grandmother   . Cancer Maternal Grandmother     colon  . Diabetes Paternal Grandmother   . Cancer Paternal Grandmother     bone    Social History   Social History  . Marital Status: Single    Spouse Name: N/A  . Number of Children: N/A  . Years of Education: N/A   Occupational History  . Not on file.   Social History Main Topics  . Smoking status: Current Every Day Smoker -- 0.50 packs/day for 4 years    Types: Cigarettes  . Smokeless tobacco: Never Used  . Alcohol Use: No     Comment: occ  . Drug Use: No     Comment: no drug use since pregnancy-  NONE SINCE   . Sexual Activity: Yes    Birth Control/ Protection: Implant   Other Topics Concern  . Not on file   Social History Narrative    HPI:  Amber [name] is here for Nexplanon removal and reinsertion.  She was given informed consent for removal of her Nexplanon and reinsertion of a new one.A signed copy is in the chart.  Appropriate time out taken. Nexlanon site (left arm--site is a little low, but pt did not want to be numbed twice) identified and thea area was prepped in usual sterile fashon. 2 cc of 1% lidocaine was used to anesthetize the area starting with the distal end of the implant. A small stab incision was made right beside the implant on the distal portion.  The Nexplanon rod was grasped using hemostats and removed without difficulty.  There was less than 3 cc blood loss. There were no complications. Next, the area was cleansed again and the new Nexplanon was  inserted without difficulty.  Steri strips and a pressure bandage were applied.  Pt was instructed to remove pressure bandage in a few hours, and keep insertion site covered with a bandaid for 3 days.  Follow-up scheduled PRN problems  Pt has UTI sx and + Nitrites.  Rx Septra DS  Amber [name], 06/03/2015 3:27 PM

## 2015-06-05 LAB — URINE CULTURE

## 2018-07-06 ENCOUNTER — Telehealth: Payer: Self-pay | Admitting: Women's Health

## 2018-07-06 NOTE — Telephone Encounter (Signed)
Pt would like to see if she can have an appt to have her Implanon removed and reinserted.

## 2018-07-06 NOTE — Telephone Encounter (Signed)
Pts nexplanon expired 06/03/15. She is wanting to know when we can get her in for removal and reinsertion?

## 2018-07-19 ENCOUNTER — Encounter: Payer: Self-pay | Admitting: *Deleted

## 2018-07-20 ENCOUNTER — Other Ambulatory Visit: Payer: Self-pay

## 2018-07-20 ENCOUNTER — Encounter: Payer: Self-pay | Admitting: Adult Health

## 2018-07-20 ENCOUNTER — Ambulatory Visit (INDEPENDENT_AMBULATORY_CARE_PROVIDER_SITE_OTHER): Payer: Medicaid Other | Admitting: Adult Health

## 2018-07-20 VITALS — BP 120/74 | HR 65 | Temp 98.6°F | Ht 65.0 in | Wt 178.0 lb

## 2018-07-20 DIAGNOSIS — Z30017 Encounter for initial prescription of implantable subdermal contraceptive: Secondary | ICD-10-CM | POA: Diagnosis not present

## 2018-07-20 DIAGNOSIS — Z3046 Encounter for surveillance of implantable subdermal contraceptive: Secondary | ICD-10-CM | POA: Diagnosis not present

## 2018-07-20 DIAGNOSIS — Z3202 Encounter for pregnancy test, result negative: Secondary | ICD-10-CM | POA: Insufficient documentation

## 2018-07-20 LAB — POCT URINE PREGNANCY: Preg Test, Ur: NEGATIVE

## 2018-07-20 MED ORDER — ETONOGESTREL 68 MG ~~LOC~~ IMPL
68.0000 mg | DRUG_IMPLANT | Freq: Once | SUBCUTANEOUS | Status: AC
  Administered 2018-07-20: 68 mg via SUBCUTANEOUS

## 2018-07-20 NOTE — Patient Instructions (Signed)
Use condoms x 2 weeks, keep clean and dry x 24 hours, no heavy lifting, keep steri strips on x 72 hours, Keep pressure dressing on x 24 hours. Follow up prn problems. Return in 6 weeks for pap and physical with me

## 2018-07-20 NOTE — Progress Notes (Signed)
Patient ID: Amber [name], female   DOB: August 07, 1990, 28 y.o.   MRN: [id number] History of Present Illness: Amber [name] is a 28 year old white female in for removal and reinsertion of nexplanon.Her las one was inserted 05/22/15. She has not had sex in over 2 months.She needs pap in near future.    Current Medications, Allergies, Past Medical History, Past Surgical History, Family History and Social History were reviewed in [employer] record.     Review of Systems: For removal and reinsertion of nexplanon    Physical Exam:BP 120/74 (BP Location: Left Arm, Patient Position: Sitting, Cuff Size: Normal)   Pulse 65   Temp 98.6 F (37 C)   Ht 5\' 5"  (1.651 m)   Wt 178 lb (80.7 kg)   LMP 07/11/2018 (Approximate)   BMI 29.62 kg/m  UPT negative.last sex over 2 months ago. General:  Well developed, well nourished, no acute distress Skin:  Warm and dry Psych:  No mood changes, alert and cooperative,seems happy Consent signed for removal and reinsertion of nexplanon. Left arm cleansed with betadine, and injected with 1.5 cc 2% lidocaine and waited til numb.Under sterile technique a #11 blade was used to make small vertical incision, and a curved forceps was used to easily remove rod. Nexplanon inserted. Steri strips applied. Pressure dressing applied. Fall risk is low.  Impression: 1. Encounter for Nexplanon removal   2. Nexplanon insertion   3. Pregnancy examination or test, negative result    Lot # [id number] exp 2022 AUG 06   Plan: Use condoms x 2 weeks, keep clean and dry x 24 hours, no heavy lifting, keep steri strips on x 72 hours, Keep pressure dressing on x 24 hours. Follow up prn problems. Pap and physical in 6 weeks with me

## 2018-07-20 NOTE — Addendum Note (Signed)
Addended by: Diona Fanti A on: 07/20/2018 01:53 PM   Modules accepted: Orders

## 2018-09-14 ENCOUNTER — Other Ambulatory Visit: Payer: Medicaid Other | Admitting: Adult Health

## 2019-12-31 ENCOUNTER — Emergency Department (HOSPITAL_COMMUNITY): Payer: Medicaid Other

## 2019-12-31 ENCOUNTER — Inpatient Hospital Stay (HOSPITAL_COMMUNITY): Payer: Medicaid Other

## 2019-12-31 ENCOUNTER — Inpatient Hospital Stay (HOSPITAL_COMMUNITY)
Admission: EM | Admit: 2019-12-31 | Discharge: 2020-01-07 | DRG: 100 | Disposition: A | Discharge status: Home / Self Care | Payer: Medicaid Other | Attending: Family Medicine | Admitting: Family Medicine

## 2019-12-31 DIAGNOSIS — E876 Hypokalemia: Secondary | ICD-10-CM | POA: Diagnosis present

## 2019-12-31 DIAGNOSIS — G9349 Other encephalopathy: Secondary | ICD-10-CM | POA: Diagnosis present

## 2019-12-31 DIAGNOSIS — F17210 Nicotine dependence, cigarettes, uncomplicated: Secondary | ICD-10-CM | POA: Diagnosis present

## 2019-12-31 DIAGNOSIS — G40909 Epilepsy, unspecified, not intractable, without status epilepticus: Principal | ICD-10-CM | POA: Diagnosis present

## 2019-12-31 DIAGNOSIS — R451 Restlessness and agitation: Secondary | ICD-10-CM | POA: Diagnosis present

## 2019-12-31 DIAGNOSIS — Z9289 Personal history of other medical treatment: Secondary | ICD-10-CM

## 2019-12-31 DIAGNOSIS — F1110 Opioid abuse, uncomplicated: Secondary | ICD-10-CM | POA: Diagnosis present

## 2019-12-31 DIAGNOSIS — R531 Weakness: Secondary | ICD-10-CM

## 2019-12-31 DIAGNOSIS — R262 Difficulty in walking, not elsewhere classified: Secondary | ICD-10-CM | POA: Diagnosis present

## 2019-12-31 DIAGNOSIS — R809 Proteinuria, unspecified: Secondary | ICD-10-CM | POA: Diagnosis present

## 2019-12-31 DIAGNOSIS — H5347 Heteronymous bilateral field defects: Secondary | ICD-10-CM | POA: Diagnosis present

## 2019-12-31 DIAGNOSIS — Z888 Allergy status to other drugs, medicaments and biological substances status: Secondary | ICD-10-CM

## 2019-12-31 DIAGNOSIS — Z79899 Other long term (current) drug therapy: Secondary | ICD-10-CM

## 2019-12-31 DIAGNOSIS — Z833 Family history of diabetes mellitus: Secondary | ICD-10-CM | POA: Diagnosis not present

## 2019-12-31 DIAGNOSIS — R569 Unspecified convulsions: Secondary | ICD-10-CM

## 2019-12-31 DIAGNOSIS — F29 Unspecified psychosis not due to a substance or known physiological condition: Secondary | ICD-10-CM | POA: Diagnosis present

## 2019-12-31 DIAGNOSIS — Z20822 Contact with and (suspected) exposure to covid-19: Secondary | ICD-10-CM | POA: Diagnosis present

## 2019-12-31 DIAGNOSIS — Z4659 Encounter for fitting and adjustment of other gastrointestinal appliance and device: Secondary | ICD-10-CM

## 2019-12-31 DIAGNOSIS — I639 Cerebral infarction, unspecified: Secondary | ICD-10-CM

## 2019-12-31 DIAGNOSIS — R9431 Abnormal electrocardiogram [ECG] [EKG]: Secondary | ICD-10-CM | POA: Diagnosis present

## 2019-12-31 DIAGNOSIS — G8194 Hemiplegia, unspecified affecting left nondominant side: Secondary | ICD-10-CM | POA: Diagnosis present

## 2019-12-31 DIAGNOSIS — R519 Headache, unspecified: Secondary | ICD-10-CM | POA: Diagnosis not present

## 2019-12-31 DIAGNOSIS — Z808 Family history of malignant neoplasm of other organs or systems: Secondary | ICD-10-CM

## 2019-12-31 DIAGNOSIS — R7982 Elevated C-reactive protein (CRP): Secondary | ICD-10-CM | POA: Diagnosis not present

## 2019-12-31 DIAGNOSIS — I6389 Other cerebral infarction: Secondary | ICD-10-CM | POA: Diagnosis not present

## 2019-12-31 DIAGNOSIS — Z781 Physical restraint status: Secondary | ICD-10-CM | POA: Diagnosis not present

## 2019-12-31 DIAGNOSIS — G43909 Migraine, unspecified, not intractable, without status migrainosus: Secondary | ICD-10-CM | POA: Diagnosis present

## 2019-12-31 DIAGNOSIS — J9601 Acute respiratory failure with hypoxia: Secondary | ICD-10-CM | POA: Diagnosis present

## 2019-12-31 DIAGNOSIS — R414 Neurologic neglect syndrome: Secondary | ICD-10-CM | POA: Diagnosis present

## 2019-12-31 DIAGNOSIS — F309 Manic episode, unspecified: Secondary | ICD-10-CM | POA: Diagnosis present

## 2019-12-31 DIAGNOSIS — Z881 Allergy status to other antibiotic agents status: Secondary | ICD-10-CM

## 2019-12-31 DIAGNOSIS — R700 Elevated erythrocyte sedimentation rate: Secondary | ICD-10-CM | POA: Diagnosis not present

## 2019-12-31 DIAGNOSIS — E041 Nontoxic single thyroid nodule: Secondary | ICD-10-CM | POA: Diagnosis present

## 2019-12-31 LAB — DIFFERENTIAL
Abs Immature Granulocytes: 0.04 10*3/uL (ref 0.00–0.07)
Basophils Absolute: 0 10*3/uL (ref 0.0–0.1)
Basophils Relative: 0 %
Eosinophils Absolute: 0.1 10*3/uL (ref 0.0–0.5)
Eosinophils Relative: 1 %
Immature Granulocytes: 1 %
Lymphocytes Relative: 23 %
Lymphs Abs: 1.8 10*3/uL (ref 0.7–4.0)
Monocytes Absolute: 0.4 10*3/uL (ref 0.1–1.0)
Monocytes Relative: 5 %
Neutro Abs: 5.6 10*3/uL (ref 1.7–7.7)
Neutrophils Relative %: 70 %

## 2019-12-31 LAB — COMPREHENSIVE METABOLIC PANEL
ALT: 24 U/L (ref 0–44)
AST: 19 U/L (ref 15–41)
Albumin: 4.3 g/dL (ref 3.5–5.0)
Alkaline Phosphatase: 50 U/L (ref 38–126)
Anion gap: 10 (ref 5–15)
BUN: <5 mg/dL — ABNORMAL LOW (ref 6–20)
CO2: 24 mmol/L (ref 22–32)
Calcium: 9.3 mg/dL (ref 8.9–10.3)
Chloride: 103 mmol/L (ref 98–111)
Creatinine, Ser: 0.71 mg/dL (ref 0.44–1.00)
GFR, Estimated: >60 mL/min (ref >60)
Glucose, Bld: 136 mg/dL — ABNORMAL HIGH (ref 70–99)
Potassium: 3.3 mmol/L — ABNORMAL LOW (ref 3.5–5.1)
Sodium: 137 mmol/L (ref 135–145)
Total Bilirubin: 0.6 mg/dL (ref 0.3–1.2)
Total Protein: 7.8 g/dL (ref 6.5–8.1)

## 2019-12-31 LAB — URINALYSIS, ROUTINE W REFLEX MICROSCOPIC
Bilirubin Urine: NEGATIVE
Glucose, UA: NEGATIVE mg/dL
Hgb urine dipstick: NEGATIVE
Ketones, ur: NEGATIVE mg/dL
Leukocytes,Ua: NEGATIVE
Nitrite: NEGATIVE
Protein, ur: NEGATIVE mg/dL
Specific Gravity, Urine: 1.035 — ABNORMAL HIGH (ref 1.005–1.030)
pH: 6 (ref 5.0–8.0)

## 2019-12-31 LAB — I-STAT BETA HCG BLOOD, ED (MC, WL, AP ONLY): I-stat hCG, quantitative: <5 m[IU]/mL (ref <5)

## 2019-12-31 LAB — I-STAT CHEM 8, ED
BUN: 4 mg/dL — ABNORMAL LOW (ref 6–20)
Calcium, Ion: 0.97 mmol/L — ABNORMAL LOW (ref 1.15–1.40)
Chloride: 105 mmol/L (ref 98–111)
Creatinine, Ser: 0.6 mg/dL (ref 0.44–1.00)
Glucose, Bld: 134 mg/dL — ABNORMAL HIGH (ref 70–99)
HCT: 40 % (ref 36.0–46.0)
Hemoglobin: 13.6 g/dL (ref 12.0–15.0)
Potassium: 3.3 mmol/L — ABNORMAL LOW (ref 3.5–5.1)
Sodium: 140 mmol/L (ref 135–145)
TCO2: 24 mmol/L (ref 22–32)

## 2019-12-31 LAB — RAPID URINE DRUG SCREEN, HOSP PERFORMED
Amphetamines: NOT DETECTED
Barbiturates: NOT DETECTED
Benzodiazepines: POSITIVE — AB
Cocaine: NOT DETECTED
Opiates: POSITIVE — AB
Tetrahydrocannabinol: NOT DETECTED

## 2019-12-31 LAB — CBC
HCT: 40.2 % (ref 36.0–46.0)
Hemoglobin: 13.6 g/dL (ref 12.0–15.0)
MCH: 32.7 pg (ref 26.0–34.0)
MCHC: 33.8 g/dL (ref 30.0–36.0)
MCV: 96.6 fL (ref 80.0–100.0)
Platelets: 263 10*3/uL (ref 150–400)
RBC: 4.16 MIL/uL (ref 3.87–5.11)
RDW: 12.4 % (ref 11.5–15.5)
WBC: 7.9 10*3/uL (ref 4.0–10.5)
nRBC: 0 % (ref 0.0–0.2)

## 2019-12-31 LAB — PROTIME-INR
INR: 1 (ref 0.8–1.2)
Prothrombin Time: 13.2 seconds (ref 11.4–15.2)

## 2019-12-31 LAB — MRSA PCR SCREENING: MRSA by PCR: NEGATIVE

## 2019-12-31 LAB — HIV ANTIBODY (ROUTINE TESTING W REFLEX): HIV Screen 4th Generation wRfx: NONREACTIVE

## 2019-12-31 LAB — LACTIC ACID, PLASMA
Lactic Acid, Venous: 1.5 mmol/L (ref 0.5–1.9)
Lactic Acid, Venous: 0.7 mmol/L (ref 0.5–1.9)

## 2019-12-31 LAB — PROCALCITONIN: Procalcitonin: <0.1 ng/mL

## 2019-12-31 LAB — APTT: aPTT: 33 seconds (ref 24–36)

## 2019-12-31 LAB — BLOOD GAS, ARTERIAL
Acid-base deficit: 0.6 mmol/L (ref 0.0–2.0)
Bicarbonate: 24.4 mmol/L (ref 20.0–28.0)
FIO2: 100
O2 Saturation: 98.9 %
Patient temperature: 37
pCO2 arterial: 46.6 mmHg (ref 32.0–48.0)
pH, Arterial: 7.339 — ABNORMAL LOW (ref 7.350–7.450)
pO2, Arterial: 155 mmHg — ABNORMAL HIGH (ref 83.0–108.0)

## 2019-12-31 LAB — RESPIRATORY PANEL BY RT PCR (FLU A&B, COVID)
Influenza A by PCR: NEGATIVE
Influenza B by PCR: NEGATIVE
SARS Coronavirus 2 by RT PCR: NEGATIVE

## 2019-12-31 LAB — CBG MONITORING, ED: Glucose-Capillary: 137 mg/dL — ABNORMAL HIGH (ref 70–99)

## 2019-12-31 LAB — TRIGLYCERIDES: Triglycerides: 91 mg/dL (ref <150)

## 2019-12-31 LAB — GLUCOSE, CAPILLARY: Glucose-Capillary: 137 mg/dL — ABNORMAL HIGH (ref 70–99)

## 2019-12-31 MED ORDER — SODIUM CHLORIDE 0.9 % IV SOLN
0.5000 mg/kg/h | INTRAVENOUS | Status: DC
  Filled 2019-12-31: qty 5

## 2019-12-31 MED ORDER — POLYETHYLENE GLYCOL 3350 17 G PO PACK
17.0000 g | PACK | Freq: Every day | ORAL | Status: DC | PRN
  Administered 2020-01-06: 17 g via ORAL
  Filled 2019-12-31: qty 1

## 2019-12-31 MED ORDER — PHENOBARBITAL SODIUM 130 MG/ML IJ SOLN
130.0000 mg | Freq: Once | INTRAMUSCULAR | Status: DC
  Filled 2019-12-31: qty 1
  Filled 2019-12-31: qty 2

## 2019-12-31 MED ORDER — LORAZEPAM 2 MG/ML IJ SOLN
INTRAMUSCULAR | Status: AC
  Administered 2019-12-31: 1 mg via INTRAVENOUS
  Filled 2019-12-31: qty 1

## 2019-12-31 MED ORDER — MIDAZOLAM 50MG/50ML (1MG/ML) PREMIX INFUSION
0.5000 mg/h | INTRAVENOUS | Status: DC
  Administered 2019-12-31: 20 mg/h via INTRAVENOUS
  Administered 2019-12-31: 10 mg/h via INTRAVENOUS
  Administered 2019-12-31 (×2): 20 mg/h via INTRAVENOUS
  Administered 2020-01-01: 10 mg/h via INTRAVENOUS
  Administered 2020-01-01: 15 mg/h via INTRAVENOUS
  Filled 2019-12-31 (×5): qty 50

## 2019-12-31 MED ORDER — ASPIRIN 325 MG PO TABS
325.0000 mg | ORAL_TABLET | Freq: Every day | ORAL | Status: DC
  Filled 2019-12-31: qty 1

## 2019-12-31 MED ORDER — FENTANYL CITRATE (PF) 100 MCG/2ML IJ SOLN: 50.0000 ug | INTRAMUSCULAR | Status: DC | PRN

## 2019-12-31 MED ORDER — PROPOFOL 1000 MG/100ML IV EMUL
INTRAVENOUS | Status: AC
  Filled 2019-12-31: qty 100

## 2019-12-31 MED ORDER — SODIUM CHLORIDE 0.9% FLUSH
3.0000 mL | Freq: Once | INTRAVENOUS | Status: AC
  Administered 2019-12-31: 3 mL via INTRAVENOUS

## 2019-12-31 MED ORDER — MORPHINE SULFATE (PF) 4 MG/ML IV SOLN
4.0000 mg | Freq: Once | INTRAVENOUS | Status: AC
  Administered 2019-12-31: 4 mg via INTRAMUSCULAR
  Filled 2019-12-31: qty 1

## 2019-12-31 MED ORDER — PROPOFOL 10 MG/ML IV BOLUS
INTRAVENOUS | Status: AC
  Filled 2019-12-31: qty 20

## 2019-12-31 MED ORDER — HALOPERIDOL LACTATE 5 MG/ML IJ SOLN
INTRAMUSCULAR | Status: AC
  Filled 2019-12-31: qty 1

## 2019-12-31 MED ORDER — FENTANYL 2500MCG IN NS 250ML (10MCG/ML) PREMIX INFUSION
50.0000 ug/h | INTRAVENOUS | Status: DC
  Administered 2019-12-31: 200 ug/h via INTRAVENOUS
  Administered 2019-12-31: 400 ug/h via INTRAVENOUS
  Administered 2020-01-01: 150 ug/h via INTRAVENOUS
  Filled 2019-12-31 (×3): qty 250

## 2019-12-31 MED ORDER — PHENOBARBITAL SODIUM 65 MG/ML IJ SOLN: 130.0000 mg | Freq: Once | INTRAMUSCULAR | Status: DC

## 2019-12-31 MED ORDER — PROPOFOL 1000 MG/100ML IV EMUL
5.0000 ug/kg/min | INTRAVENOUS | Status: DC
  Administered 2019-12-31 (×2): 70 ug/kg/min via INTRAVENOUS
  Administered 2020-01-01: 60 ug/kg/min via INTRAVENOUS
  Administered 2020-01-01: 45 ug/kg/min via INTRAVENOUS
  Filled 2019-12-31 (×5): qty 100

## 2019-12-31 MED ORDER — FENTANYL BOLUS VIA INFUSION
50.0000 ug | INTRAVENOUS | Status: DC | PRN
  Filled 2019-12-31: qty 50

## 2019-12-31 MED ORDER — CHLORHEXIDINE GLUCONATE CLOTH 2 % EX PADS
6.0000 | MEDICATED_PAD | Freq: Every day | CUTANEOUS | Status: DC
  Administered 2019-12-31 – 2020-01-02 (×3): 6 via TOPICAL

## 2019-12-31 MED ORDER — ASPIRIN 325 MG PO TABS
325.0000 mg | ORAL_TABLET | Freq: Every day | ORAL | Status: DC
  Administered 2019-12-31 – 2020-01-06 (×7): 325 mg
  Filled 2019-12-31 (×6): qty 1

## 2019-12-31 MED ORDER — MORPHINE SULFATE (PF) 4 MG/ML IV SOLN
INTRAVENOUS | Status: AC
  Administered 2019-12-31: 4 mg via INTRAMUSCULAR
  Filled 2019-12-31: qty 1

## 2019-12-31 MED ORDER — ORAL CARE MOUTH RINSE
15.0000 mL | OROMUCOSAL | Status: DC
  Administered 2019-12-31 – 2020-01-01 (×7): 15 mL via OROMUCOSAL

## 2019-12-31 MED ORDER — DOCUSATE SODIUM 100 MG PO CAPS
100.0000 mg | ORAL_CAPSULE | Freq: Two times a day (BID) | ORAL | Status: DC | PRN
  Administered 2020-01-05 – 2020-01-06 (×2): 100 mg via ORAL
  Filled 2019-12-31 (×2): qty 1

## 2019-12-31 MED ORDER — ACETAMINOPHEN 325 MG PO TABS
650.0000 mg | ORAL_TABLET | ORAL | Status: DC | PRN
  Administered 2020-01-01 – 2020-01-06 (×11): 650 mg via ORAL
  Filled 2019-12-31 (×11): qty 2

## 2019-12-31 MED ORDER — CHLORHEXIDINE GLUCONATE 0.12% ORAL RINSE (MEDLINE KIT)
15.0000 mL | Freq: Two times a day (BID) | OROMUCOSAL | Status: DC
  Administered 2019-12-31 – 2020-01-06 (×7): 15 mL via OROMUCOSAL

## 2019-12-31 MED ORDER — SODIUM CHLORIDE 0.9 % IV SOLN
2.0000 g | Freq: Three times a day (TID) | INTRAVENOUS | Status: DC
  Administered 2020-01-01 – 2020-01-06 (×17): 2 g via INTRAVENOUS
  Filled 2019-12-31 (×18): qty 2

## 2019-12-31 MED ORDER — HALOPERIDOL LACTATE 5 MG/ML IJ SOLN
5.0000 mg | Freq: Once | INTRAMUSCULAR | Status: AC
  Administered 2019-12-31: 5 mg via INTRAMUSCULAR

## 2019-12-31 MED ORDER — IOHEXOL 350 MG/ML SOLN
100.0000 mL | Freq: Once | INTRAVENOUS | Status: AC | PRN
  Administered 2019-12-31: 100 mL via INTRAVENOUS

## 2019-12-31 MED ORDER — VECURONIUM BROMIDE 10 MG IV SOLR
INTRAVENOUS | Status: AC
  Administered 2019-12-31: 10 mg via INTRAVENOUS
  Filled 2019-12-31: qty 10

## 2019-12-31 MED ORDER — DEXMEDETOMIDINE HCL IN NACL 400 MCG/100ML IV SOLN
0.4000 ug/kg/h | INTRAVENOUS | Status: DC
  Administered 2019-12-31 (×3): 1.2 ug/kg/h via INTRAVENOUS
  Filled 2019-12-31 (×4): qty 100

## 2019-12-31 MED ORDER — PANTOPRAZOLE SODIUM 40 MG IV SOLR
40.0000 mg | Freq: Every day | INTRAVENOUS | Status: DC
  Administered 2019-12-31 – 2020-01-03 (×4): 40 mg via INTRAVENOUS
  Filled 2019-12-31 (×4): qty 40

## 2019-12-31 MED ORDER — VANCOMYCIN HCL 2000 MG/400ML IV SOLN
2000.0000 mg | Freq: Once | INTRAVENOUS | Status: AC
  Administered 2019-12-31: 2000 mg via INTRAVENOUS
  Filled 2019-12-31: qty 400

## 2019-12-31 MED ORDER — SODIUM CHLORIDE 0.9 % IV SOLN
2.0000 g | INTRAVENOUS | Status: AC
  Administered 2019-12-31: 2 g via INTRAVENOUS
  Filled 2019-12-31: qty 2

## 2019-12-31 MED ORDER — ASPIRIN 300 MG RE SUPP: 300.0000 mg | Freq: Every day | RECTAL | Status: DC

## 2019-12-31 MED ORDER — STROKE: EARLY STAGES OF RECOVERY BOOK
Freq: Once | Status: AC
  Filled 2019-12-31: qty 1

## 2019-12-31 MED ORDER — FENTANYL CITRATE (PF) 100 MCG/2ML IJ SOLN
INTRAMUSCULAR | Status: AC
  Filled 2019-12-31: qty 4

## 2019-12-31 MED ORDER — FENTANYL CITRATE (PF) 100 MCG/2ML IJ SOLN
50.0000 ug | Freq: Once | INTRAMUSCULAR | Status: AC
  Administered 2019-12-31: 50 ug via INTRAVENOUS

## 2019-12-31 MED ORDER — KETAMINE HCL 50 MG/5ML IJ SOSY
PREFILLED_SYRINGE | INTRAMUSCULAR | Status: AC
  Filled 2019-12-31: qty 15

## 2019-12-31 NOTE — ED Triage Notes (Signed)
Pt BIB [facility] from home. Pt experiencing left sided weakness, left sided neglect. Pt confused upon arrival. VSS.

## 2019-12-31 NOTE — ED Notes (Signed)
Checked patient blood sugar it was 137 notified RN of blood sugar

## 2019-12-31 NOTE — ED Provider Notes (Signed)
[facility] EMERGENCY DEPARTMENT Provider Note   CSN: [id number] Arrival date & time: 12/31/19  1256  An emergency department physician performed an initial assessment on this suspected stroke patient at 1257.  History No chief complaint on file.   Amber [name] is a 29 y.o. female.  HPI Past medical history significant for heroin abuse currently treated with methadone and migraine headache.  Patient arrives as code stroke.  Exact time of onset of symptoms unclear.  Patient transported by EMS from her worksite.  Patient described difficulty walking and at first had weakness in her right leg, and tingling in the right arm.  During EMS transport, symptoms migrated to the left side with tingling and numbness.  Patient reports she has a severe headache on the right side of her head.  She also did describe some symptoms on Saturday, 3 days ago.  This appears to have been more of a headache symptom.  On arrival, on EMS stretcher, patient exhibited significant agitation and hysteria.  She is crying profusely and moving her arms back and forth.  She was able to correctly identify items without difficulty.  As evidence of higher cognitive function, part of nurse questioning was pointing to a watch and asking the patient "what she is wearing".  Patient replied with "gray fleece", which in fact is what the interrogating nurse was wearing.  Patient was able to clarify for the nurse that she was naming the  sweatshirt the nurse was wearing rather than her watch.  Patient was able to answer questions correctly regarding other objects shown to her.  Her speech was clear although pressured.  However, as she sat on the stretcher she was continually moving her arms about from side to side and trying to lean back and forth off the sides of the stretcher, while crying and producing a continuous conversation of clear but pressured speech     Past Medical History:  Diagnosis Date  . Abnormal Pap  smear   . GBS (group B streptococcus) UTI complicating pregnancy 9/38/1017  . IC (interstitial cystitis) 04/17/2012  . Interstitial cystitis   . Ovarian cyst   . Protein in urine   . Renal abscess     Patient Active Problem List   Diagnosis Date Noted  . Acute left-sided weakness 12/31/2019  . Mania ([place]) 12/31/2019  . Pregnancy examination or test, negative result 07/20/2018  . Encounter for Nexplanon removal 07/20/2018  . Nexplanon removal 06/03/2015  . Nexplanon insertion 06/03/2015  . Pyelonephritis 05/11/2013  . Renal abscess 05/11/2013  . Fever, unspecified 05/11/2013  . Tobacco use disorder 05/11/2013  . Sepsis ([place]) 05/11/2013  . SVD (spontaneous vaginal delivery) 07/02/2012  . IC (interstitial cystitis) 04/17/2012    Past Surgical History:  Procedure Laterality Date  . DILATION AND CURETTAGE OF UTERUS       OB History    Gravida  2   Para  1   Term  1   Preterm      AB  1   Living  1     SAB  1   TAB      Ectopic      Multiple      Live Births  1           Family History  Problem Relation Age of Onset  . Cancer Maternal Uncle   . Diabetes Maternal Grandmother   . Cancer Maternal Grandmother        colon  . Diabetes  Paternal Grandmother   . Cancer Paternal Grandmother        bone  . Other Neg Hx     Social History   Tobacco Use  . Smoking status: Current Every Day Smoker    Packs/day: 0.50    Years: 4.00    Pack years: 2.00    Types: Cigarettes  . Smokeless tobacco: Never Used  Vaping Use  . Vaping Use: Never used  Substance Use Topics  . Alcohol use: No    Comment: occ  . Drug use: No    Types: Benzodiazepines, Marijuana, Oxycodone    Comment: no drug use since pregnancy-  NONE SINCE     Home Medications Prior to Admission medications   Medication Sig Start Date End Date Taking? Authorizing Provider  etonogestrel (NEXPLANON) 68 MG IMPL implant 1 each by Subdermal route once.    [provider]     Allergies    Clarithromycin  Review of Systems   Review of Systems Level 5 caveat unable to obtain review of systems due to patient's level of cooperation and agitation Physical Exam Updated Vital Signs BP 133/75   Pulse (!) 117   Resp 20   Ht 5\' 5"  (1.651 m)   Wt 84.4 kg   LMP  (LMP Unknown)   SpO2 100%   BMI 30.96 kg/m   Physical Exam Constitutional:      Comments: Alert and extremely anxious and tearful.  Hyperventilating but no respiratory distress.  Color is good.  HENT:     Head: Normocephalic and atraumatic.     Mouth/Throat:     Mouth: Mucous membranes are moist.     Pharynx: Oropharynx is clear.  Eyes:     Comments: Pupils are symmetric.   Cardiovascular:     Comments: Tachycardia.  No appreciable rub murmur gallop however high rate. Pulmonary:     Comments: Hyperventilating but clear to auscultation without focal wheezes rhonchi or rale Abdominal:     Comments: Abdomen soft and nondistended.  No guarding.  Musculoskeletal:     Comments: No extremity deformities or edema.  Extensive track marks on left hand.  Skin:    General: Skin is warm and dry.  Neurological:     Comments: On arrival, patient is very agitated and crying profusely.  She is producing spontaneous speech.  Much of her conversation is situationally oriented to obtaining pain medication and her symptoms.  She does not seem to have any expressive or receptive aphasia.  She is moving all extremities with good strength to resist positioning on the stretcher.     ED Results / Procedures / Treatments   Labs (all labs ordered are listed, but only abnormal results are displayed) Labs Reviewed  COMPREHENSIVE METABOLIC PANEL - Abnormal; Notable for the following components:      Result Value   Potassium 3.3 (*)    Glucose, Bld 136 (*)    BUN <5 (*)    All other components within normal limits  I-STAT CHEM 8, ED - Abnormal; Notable for the following components:   Potassium 3.3 (*)    BUN 4  (*)    Glucose, Bld 134 (*)    Calcium, Ion 0.97 (*)    All other components within normal limits  CBG MONITORING, ED - Abnormal; Notable for the following components:   Glucose-Capillary 137 (*)    All other components within normal limits  RESPIRATORY PANEL BY RT PCR (FLU A&B, COVID)  CULTURE, BLOOD (ROUTINE X 2)  CULTURE, BLOOD (ROUTINE X 2)  URINE CULTURE  PROTIME-INR  APTT  CBC  DIFFERENTIAL  HIV ANTIBODY (ROUTINE TESTING W REFLEX)  URINALYSIS, ROUTINE W REFLEX MICROSCOPIC  LACTIC ACID, PLASMA  LACTIC ACID, PLASMA  PROCALCITONIN  RAPID URINE DRUG SCREEN, HOSP PERFORMED  I-STAT BETA HCG BLOOD, ED ([facility], [facility], [facility] ONLY)    EKG EKG Interpretation  Date/Time:  Tuesday December 31 2019 14:01:24 EDT Ventricular Rate:  111 PR Interval:    QRS Duration: 99 QT Interval:  418 QTC Calculation: 569 R Axis:   82 Text Interpretation: Sinus tachycardia LAE, consider biatrial enlargement Abnormal T, consider ischemia, inferior leads Prolonged QT interval no sig change from previous Confirmed by Charlesetta Shanks [phone]) on 12/31/2019 4:38:00 PM   Radiology CT Code Stroke CTA Head W/WO contrast  Result Date: 12/31/2019 CLINICAL DATA:  Code stroke follow-up, left weakness and neglect EXAM: CT ANGIOGRAPHY HEAD AND NECK CT PERFUSION BRAIN TECHNIQUE: Multidetector CT imaging of the head and neck was performed using the standard protocol during bolus administration of intravenous contrast. Multiplanar CT image reconstructions and MIPs were obtained to evaluate the vascular anatomy. Carotid stenosis measurements (when applicable) are obtained utilizing NASCET criteria, using the distal internal carotid diameter as the denominator. Multiphase CT imaging of the brain was performed following IV bolus contrast injection. Subsequent parametric perfusion maps were calculated using RAPID software. CONTRAST:  140mL OMNIPAQUE IOHEXOL 350 MG/ML SOLN COMPARISON:  None. FINDINGS: Suboptimal arterial contrast  bolus timing. CTA NECK FINDINGS Aortic arch: Great vessel origins are patent. Right carotid system: Patent. No measurable stenosis or evidence of dissection. Left carotid system: Patent. No measurable stenosis or evidence of dissection. Vertebral arteries: Patent. Right vertebral artery slightly dominant. No measurable stenosis or evidence of dissection. Skeleton: No significant abnormality. Other neck: There is a 1.3 cm nodule of the left thyroid. Upper chest: Partially imaged bilateral dependent atelectasis/consolidation. Review of the MIP images confirms the above findings CTA HEAD FINDINGS Anterior circulation: Intracranial internal carotid arteries are patent. Anterior and middle cerebral arteries are patent. Posterior circulation: Intracranial right vertebral artery is patent. There is diminished visualization of the mid left intracranial vertebral artery. Basilar artery is patent. Posterior cerebral arteries are patent. A right posterior communicating artery is present. Venous sinuses: As permitted by contrast timing, patent. Review of the MIP images confirms the above findings CT Brain Perfusion Findings: CBF (<30%) Volume: 0 mL, 5 mL right parietal lobe with less stringent threshold Perfusion (Tmax>6.0s) volume: 0 mL Mismatch Volume: 0 mL Infarction Location: None. IMPRESSION: No large vessel occlusion. Perfusion imaging demonstrates no evidence of core infarction or penumbra, although there may be some right parietal ischemia using less stringent threshold. Diminished visualization of mid left intracranial vertebral artery, which may be related to small caliber vessel. 1.3 cm left thyroid nodule. Recommend nonemergent thyroid US follow-up by current guidelines. (Ref: J Am Coll Radiol. 2015 Feb;12(2): 143-50). Electronically Signed   By: Macy Mis M.D.   On: 12/31/2019 16:11   CT Code Stroke CTA Neck W/WO contrast  Result Date: 12/31/2019 CLINICAL DATA:  Code stroke follow-up, left weakness and  neglect EXAM: CT ANGIOGRAPHY HEAD AND NECK CT PERFUSION BRAIN TECHNIQUE: Multidetector CT imaging of the head and neck was performed using the standard protocol during bolus administration of intravenous contrast. Multiplanar CT image reconstructions and MIPs were obtained to evaluate the vascular anatomy. Carotid stenosis measurements (when applicable) are obtained utilizing NASCET criteria, using the distal internal carotid diameter as the denominator. Multiphase CT imaging of the  brain was performed following IV bolus contrast injection. Subsequent parametric perfusion maps were calculated using RAPID software. CONTRAST:  183mL OMNIPAQUE IOHEXOL 350 MG/ML SOLN COMPARISON:  None. FINDINGS: Suboptimal arterial contrast bolus timing. CTA NECK FINDINGS Aortic arch: Great vessel origins are patent. Right carotid system: Patent. No measurable stenosis or evidence of dissection. Left carotid system: Patent. No measurable stenosis or evidence of dissection. Vertebral arteries: Patent. Right vertebral artery slightly dominant. No measurable stenosis or evidence of dissection. Skeleton: No significant abnormality. Other neck: There is a 1.3 cm nodule of the left thyroid. Upper chest: Partially imaged bilateral dependent atelectasis/consolidation. Review of the MIP images confirms the above findings CTA HEAD FINDINGS Anterior circulation: Intracranial internal carotid arteries are patent. Anterior and middle cerebral arteries are patent. Posterior circulation: Intracranial right vertebral artery is patent. There is diminished visualization of the mid left intracranial vertebral artery. Basilar artery is patent. Posterior cerebral arteries are patent. A right posterior communicating artery is present. Venous sinuses: As permitted by contrast timing, patent. Review of the MIP images confirms the above findings CT Brain Perfusion Findings: CBF (<30%) Volume: 0 mL, 5 mL right parietal lobe with less stringent threshold  Perfusion (Tmax>6.0s) volume: 0 mL Mismatch Volume: 0 mL Infarction Location: None. IMPRESSION: No large vessel occlusion. Perfusion imaging demonstrates no evidence of core infarction or penumbra, although there may be some right parietal ischemia using less stringent threshold. Diminished visualization of mid left intracranial vertebral artery, which may be related to small caliber vessel. 1.3 cm left thyroid nodule. Recommend nonemergent thyroid US follow-up by current guidelines. (Ref: J Am Coll Radiol. 2015 Feb;12(2): 143-50). Electronically Signed   By: Macy Mis M.D.   On: 12/31/2019 16:11   CT Code Stroke Cerebral Perfusion with contrast  Result Date: 12/31/2019 CLINICAL DATA:  Code stroke follow-up, left weakness and neglect EXAM: CT ANGIOGRAPHY HEAD AND NECK CT PERFUSION BRAIN TECHNIQUE: Multidetector CT imaging of the head and neck was performed using the standard protocol during bolus administration of intravenous contrast. Multiplanar CT image reconstructions and MIPs were obtained to evaluate the vascular anatomy. Carotid stenosis measurements (when applicable) are obtained utilizing NASCET criteria, using the distal internal carotid diameter as the denominator. Multiphase CT imaging of the brain was performed following IV bolus contrast injection. Subsequent parametric perfusion maps were calculated using RAPID software. CONTRAST:  113mL OMNIPAQUE IOHEXOL 350 MG/ML SOLN COMPARISON:  None. FINDINGS: Suboptimal arterial contrast bolus timing. CTA NECK FINDINGS Aortic arch: Great vessel origins are patent. Right carotid system: Patent. No measurable stenosis or evidence of dissection. Left carotid system: Patent. No measurable stenosis or evidence of dissection. Vertebral arteries: Patent. Right vertebral artery slightly dominant. No measurable stenosis or evidence of dissection. Skeleton: No significant abnormality. Other neck: There is a 1.3 cm nodule of the left thyroid. Upper chest:  Partially imaged bilateral dependent atelectasis/consolidation. Review of the MIP images confirms the above findings CTA HEAD FINDINGS Anterior circulation: Intracranial internal carotid arteries are patent. Anterior and middle cerebral arteries are patent. Posterior circulation: Intracranial right vertebral artery is patent. There is diminished visualization of the mid left intracranial vertebral artery. Basilar artery is patent. Posterior cerebral arteries are patent. A right posterior communicating artery is present. Venous sinuses: As permitted by contrast timing, patent. Review of the MIP images confirms the above findings CT Brain Perfusion Findings: CBF (<30%) Volume: 0 mL, 5 mL right parietal lobe with less stringent threshold Perfusion (Tmax>6.0s) volume: 0 mL Mismatch Volume: 0 mL Infarction Location: None. IMPRESSION: No large vessel  occlusion. Perfusion imaging demonstrates no evidence of core infarction or penumbra, although there may be some right parietal ischemia using less stringent threshold. Diminished visualization of mid left intracranial vertebral artery, which may be related to small caliber vessel. 1.3 cm left thyroid nodule. Recommend nonemergent thyroid US follow-up by current guidelines. (Ref: J Am Coll Radiol. 2015 Feb;12(2): 143-50). Electronically Signed   By: Macy Mis M.D.   On: 12/31/2019 16:11   DG Chest Port 1 View  Result Date: 12/31/2019 CLINICAL DATA:  Stroke, endotracheal tube EXAM: PORTABLE CHEST 1 VIEW COMPARISON:  2015 FINDINGS: Endotracheal tube is approximately 3 cm above the carina. Lungs are clear. No pleural effusion or pneumothorax. Normal heart size for technique. IMPRESSION: Endotracheal tube 3 cm above the carina. Electronically Signed   By: Macy Mis M.D.   On: 12/31/2019 15:33   CT HEAD CODE STROKE WO CONTRAST  Result Date: 12/31/2019 CLINICAL DATA:  Code stroke. EXAM: CT HEAD WITHOUT CONTRAST TECHNIQUE: Contiguous axial images were obtained  from the base of the skull through the vertex without intravenous contrast. COMPARISON:  None. FINDINGS: Brain: There is no acute intracranial hemorrhage, mass effect, or edema. Gray-white differentiation is preserved. Ventricles and sulci are normal in size and configuration. There is no extra-axial collection. Vascular: No hyperdense vessel. Skull: Unremarkable. Sinuses/Orbits: Minor mucosal thickening.  Orbits are unremarkable. Other: Mastoid air cells are clear. ASPECTS (Speed Stroke Program Early CT Score) - Ganglionic level infarction (caudate, lentiform nuclei, internal capsule, insula, M1-M3 cortex): 7 - Supraganglionic infarction (M4-M6 cortex): 3 Total score (0-10 with 10 being normal): 10 IMPRESSION: There is no acute intracranial hemorrhage or evidence of acute infarction. ASPECT score is 10. These results were communicated to Dr. Leonel Ramsay at 3:43 pm on 12/31/2019 by text page via the [facility] messaging system. Electronically Signed   By: Macy Mis M.D.   On: 12/31/2019 15:49    Procedures Date/Time: 12/31/2019 4:59 PM Performed by: Charlesetta Shanks, MD Pre-anesthesia Checklist: Emergency Drugs available, Suction available, Patient identified and Patient being monitored Oxygen Delivery Method: Ambu bag Preoxygenation: Pre-oxygenation with 100% oxygen Induction Type: IV induction Ventilation: Mask ventilation without difficulty Laryngoscope Size: Glidescope and 3 Tube size: 7.5 mm Number of attempts: 1 Placement Confirmation: ETT inserted through vocal cords under direct vision,  CO2 detector and Breath sounds checked- equal and bilateral Dental Injury: Teeth and Oropharynx as per pre-operative assessment  Comments: Patient intubated due to extreme agitation.  Multiple doses of sedation medication had been administered.  Patient was given etomidate 20 mg and succinylcholine 150 mg.  She was monitored throughout.  No oxygen desaturation occurred during intubation.  Once patient  paralyzed intubated without difficulty.      (including critical care time) CRITICAL CARE Performed by: Charlesetta Shanks   Total critical care time: 120 minutes  Critical care time was exclusive of separately billable procedures and treating other patients.  Critical care was necessary to treat or prevent imminent or life-threatening deterioration.  Critical care was time spent personally by me on the following activities: development of treatment plan with patient and/or surrogate as well as nursing, discussions with consultants, evaluation of patient's response to treatment, examination of patient, obtaining history from patient or surrogate, ordering and performing treatments and interventions, ordering and review of laboratory studies, ordering and review of radiographic studies, pulse oximetry and re-evaluation of patient's condition.  Angiocath insertion Performed by: Charlesetta Shanks  Consent: Verbal consent obtained. Risks and benefits: risks, benefits and alternatives were discussed Time out: Immediately prior  to procedure a "time out" was called to verify the correct patient, procedure, equipment, support staff and site/side marked as required.  Preparation: Patient was prepped and draped in the usual sterile fashion.  Vein Location:right Basilic  Yes Ultrasound Guided  Gauge: 18  Normal blood return and flush without difficulty Patient tolerance: Patient tolerated the procedure well with no immediate complications.  Angiocath insertion Performed by: Charlesetta Shanks  Consent: Verbal consent obtained. Risks and benefits: risks, benefits and alternatives were discussed Time out: Immediately prior to procedure a "time out" was called to verify the correct patient, procedure, equipment, support staff and site/side marked as required.  Preparation: Patient was prepped and draped in the usual sterile fashion.  Vein Location: Left external jugular  No Ultrasound  Guided  Gauge: 18-gauge long  Normal blood return and flush without difficulty Patient tolerance: Patient tolerated the procedure well with no immediate complications.   Angiocath insertion Second Angiocath required because first 1 pulled out due to patient agitation during first attempt at CT angiogram.  Performed by: Charlesetta Shanks  Consent: Verbal consent obtained. Risks and benefits: risks, benefits and alternatives were discussed Time out: Immediately prior to procedure a "time out" was called to verify the correct patient, procedure, equipment, support staff and site/side marked as required.  Preparation: Patient was prepped and draped in the usual sterile fashion.  Vein Location: right basilic  Yes Ultrasound Guided  Gauge: 18  Normal blood return and flush without difficulty Patient tolerance: Patient tolerated the procedure well with no immediate complications.     Medications Ordered in ED Medications  propofol (DIPRIVAN) 10 mg/mL bolus/IV push (has no administration in time range)  fentaNYL (SUBLIMAZE) injection 50 mcg (has no administration in time range)  fentaNYL (SUBLIMAZE) injection 50-200 mcg (has no administration in time range)  fentaNYL 2571mcg in NS 219mL (10mcg/ml) infusion-PREMIX (400 mcg/hr Intravenous New Bag/Given 12/31/19 1532)  fentaNYL (SUBLIMAZE) bolus via infusion 50 mcg (has no administration in time range)  docusate sodium (COLACE) capsule 100 mg (has no administration in time range)  polyethylene glycol (MIRALAX / GLYCOLAX) packet 17 g (has no administration in time range)  pantoprazole (PROTONIX) injection 40 mg (has no administration in time range)  acetaminophen (TYLENOL) tablet 650 mg (has no administration in time range)  midazolam (VERSED) 50 mg/50 mL (1 mg/mL) premix infusion (10 mg/hr Intravenous New Bag/Given 12/31/19 1626)  dexmedetomidine (PRECEDEX) 400 MCG/100ML (4 mcg/mL) infusion (1.2 mcg/kg/hr  84.4 kg Intravenous New  Bag/Given 12/31/19 1627)  vancomycin (VANCOREADY) IVPB 2000 mg/400 mL (has no administration in time range)  ketamine (KETALAR) 500 mg in sodium chloride 0.9 % 100 mL (5 mg/mL) infusion (has no administration in time range)  PHENObarbital (LUMINAL) injection 130 mg (has no administration in time range)  sodium chloride flush (NS) 0.9 % injection 3 mL (3 mLs Intravenous Given 12/31/19 1419)  LORazepam (ATIVAN) 2 MG/ML injection (1 mg Intravenous Given 12/31/19 1418)  morphine 4 MG/ML injection 4 mg (4 mg Intramuscular Given 12/31/19 1413)  haloperidol lactate (HALDOL) injection 5 mg (5 mg Intramuscular Given 12/31/19 1418)  haloperidol lactate (HALDOL) 5 MG/ML injection (  Given 12/31/19 1340)  propofol (DIPRIVAN) 1000 MG/100ML infusion ( Intravenous (Continuous Infusion) New Bag/Given 12/31/19 1450)  vecuronium (NORCURON) 10 MG injection (10 mg Intravenous Given 12/31/19 1450)  fentaNYL (SUBLIMAZE) 100 MCG/2ML injection (  Given 12/31/19 1450)  fentaNYL (SUBLIMAZE) injection 50 mcg (50 mcg Intravenous Given 12/31/19 1628)  iohexol (OMNIPAQUE) 350 MG/ML injection 100 mL (100 mLs Intravenous Contrast  Given 12/31/19 1552)    ED Course  I have reviewed the triage vital signs and the nursing notes.  Pertinent labs & imaging results that were available during my care of the patient were reviewed by me and considered in my medical decision making (see chart for details).    MDM Rules/Calculators/A&P                          Patient presented as outlined.  She arrived as a code stroke.  Patient was extremely agitated.  She did not have any airway impairment, hypoxia, severe hypertension.  She was not cooperative for first attempt at CT scan.  Even noncontrast CT could not be obtained.  I had placed a 18-gauge Angiocath for CT angiogram but this was pulled out during patient's struggling and moving in the CT scanner.  At that time, patient was making multiple requests for pain medication.  She was  excepting of placement of an IV recognizing that that would allow her to get medication.  However, she continued to move from side to side and pull her arms across her body and try to sit up repeatedly.  After attempting IM sedation and the first Angiocath being pulled out due to agitation and excessive physical resistance to diagnostic evaluation, patient was transported to an ED room for adequate sedation to proceed with diagnostic evaluation.  In the room, patient was still requesting pain medication for her headache and, a cold drink and to get up and walk to the bathroom.  However, she was also concurrently not cooperative with remaining adequately still to safely place an IV.  At any point in time she was positioned and ultrasound guide attempted, she would roll and try to transition onto her side or her abdomen making it impossible to establish another peripheral access without significant risk of needlestick both inadvertently to myself and the patient.  Patient was equivocal regarding last heroin use.  She had told me it was a month ago but had told the neurologist a week ago.  I asked her if she thought withdrawal could be part of the problem which she denied.  Despite IM Ativan, Haldol 5 mg IM x2, morphine 4 mg IM x2, patient could not be adequately still to place a peripheral IV or do a noncontrast CT scan.  At this time, it became evident patient would have to be intubated and fully sedated to proceed with diagnostic evaluation.  Nursing staff was able to place a small peripheral IV in order to administer medications for intubation.  Patient continued to be physically resistant and moving throughout evaluation until sedated and paralyzed and placed on the ventilator.  Throughout this management, patient exhibited very good strength in all 4 extremities and her core musculature.  Patient intubated without difficulty with glide scope.  Large doses of fentanyl and propofol required to maintain  sedation.  Patient will be admitted to critical care service.  Noncontrast CT does not show acute intracranial findings.  She will require further evaluation for possible neurologic etiology such as CVA or septic emboli, versus agitated delirium due to either withdrawal or other substance use. Final Clinical Impression(s) / ED Diagnoses Final diagnoses:  Stroke (cerebrum) ([place])  History of ETT    Rx / DC Orders ED Discharge Orders    None       Charlesetta Shanks, MD 12/31/19 1715

## 2019-12-31 NOTE — ED Notes (Addendum)
Pt intubated by EDP. Pt given 20 of etomidate and 150 Succinylcholine. Pt given 200 mcg fentanyl for agitation post intubation. Tube placement verified via xray. Pt placed in violent restraints for agitation and combativeness. Pt taken for CT scan.

## 2019-12-31 NOTE — Progress Notes (Signed)
Pharmacy Antibiotic Note  Amber [name] is a 29 y.o. female admitted on 12/31/2019 with left side neglect concerning for embolic stroke.  Pharmacy has been consulted for cefepime dosing for rue out bacteremia, concerning for septic emboli.  Vancomycin loading dose has been ordered.  Scr 0.6, CrCL 109 ml/min, WBC WNL.  Plan: Cefepime 2gm IV Q8H Monitor renal fxn, clinical progress F/U with the need to continue MRSA coverage  Height: 5\' 5"  (165.1 cm) Weight: 84.4 kg (186 lb 1.1 oz) IBW/kg (Calculated) : 57  No data recorded.  Recent Labs  Lab 12/31/19 1300 12/31/19 1304  WBC 7.9  --   CREATININE 0.71 0.60    Estimated Creatinine Clearance: 109.4 mL/min (by C-G formula based on SCr of 0.6 mg/dL).    Allergies  Allergen Reactions  . Clarithromycin Anaphylaxis    Biaxin    Cefepime 10/26 >> Vanc x1 10/26  10/26 covid / flu -  10/26 UCx -  10/26 BCx -    D. Mina Marble, PharmD, BCPS, [place] 12/31/2019, 4:45 PM

## 2019-12-31 NOTE — H&P (Addendum)
Attending Note  12/31/2019 I saw and evaluated the patient. Discussed with resident and agree with resident's findings and plan as documented in the resident's note.  I have seen and evaluated the patient for psychosis and neuro deficits ultimately culminating in respiratory failure.  S:  Hx of IVDA p/w difficulty walking, agitation.  She was noted to have neurological deficits on exam and multiple attempts to sedate her were unsuccessful.  She was ultimately restrained and intubated for airway protection.  Stroke scan pending.  PCCM asked to admit. History per chart review as patient is intubated and sedated.  O: Blood pressure 111/62, pulse (!) 117, resp. rate (!) 22, height 5\' 5"  (1.651 m), weight 84.4 kg, SpO2 95 %.  Young woman in NAD on vent Eyes are dilated, R>L, both reactive to light Not withdrawing to pain for me but has just been induced Starting to gag on tube a bit Ext warm Heart sounds regular, tachycardic, no loud murmurs Skin without rashes  A:  Stroke-like symptoms in patient with hx of IVDA raises concern for septic emboli. Acute hypoxemic respiratory failure due to inability to protect airway and question of drug-induced psychosis Acute encephalopathy question drug effect  P:  - f/u stroke read from CT, may not be in window for intervention per Dr. Leonel Ramsay - Echo, blood cultures, UDS, Pct - Start empiric abx, d/c if cultures and echo neg - Keep heavily sedated for now - MRI at neurology's discretion - Usual VAP prevention bundle - Updated mother Alberteen Sam by phone at [phone], she would like a call if any major changes to plan   Patient critically ill due to respiratory failure, question stroke, question endocarditis, acute encephalopathy Interventions to address this today: ventilator management, abx, echo, labs as above, supportive care Risk of deterioration without these interventions is high  I personally spent 35 minutes providing critical  care not including any separately billable procedures  Erskine Emery MD [facility] 12/31/2019 4:22 PM Personal pager: [phone] If unanswered, please page CCM On-call: [phone]       NAME:  SERINE KEA, MRN:  [id number], DOB:  [birth year], LOS: 0 ADMISSION DATE:  12/31/2019, CONSULTATION DATE:  12/31/2019 REFERRING MD:  Dr. Johnney Killian, CHIEF COMPLAINT:  Left side neglect    Brief History   Ms. Princes Finger is a 29 y/o female with a PMHx of IVDU who presents to the ED with c/o left-sided neglect concerning for embolic stroke.   History of present illness   Ms. Annalysia Willenbring is a 29 y/o female with a PMHx of IVDU who presents to the ED with c/o left-sided neglect concerning for embolic stroke. Patient is intubated and sedated on examination. History per Neurology.   "ESME FREUND is a 29 y.o. female proteinuria, group B streptococcus and urine, and migraine headaches.  Patient states that this past Saturday she did have a migraine headache with pain over the right temporal lobe.  Prior to that on 17 October she also had 2 teeth removed without complications.  She does use heroin but states that last time she had been using heroin was approximately 1 week ago.  She also takes methadone from the methadone clinic and apparently did pick up some methadone this morning.  She went to work this morning when suddenly she started noticing that she was having difficulty walking, right foot dragging then right arm tingling and decreased sensation which then later while in the EMS vehicle switch to the left leg and left  side.  She currently is having a severe right temporal headache which she states is a 10/10.  Through multiple attempts to get CT her main complaint at this time is the headache.  She does not complain of any nausea vomiting.  Multiple attempts to get patient to stay still in the MRI were attempted however were futile.  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  Objective   Blood pressure 111/62, pulse (!) 117, resp. rate (!) 22, height 5\' 5"  (1.651 m), weight 84.4 kg, SpO2 95 %.       No intake or output data in the 24 hours ending 12/31/19 1516 Filed Weights   12/31/19 1200  Weight: 84.4 kg   Examination: General: Intubated and sedated, in no acute distress HENT: Atraumatic   Please see attending's note for rest of physical examination.   Resolved Hospital Problem list   N/A  Assessment & Plan:   # Acute Left-sided Weakness - High suspicion for stroke in a patient that uses IV drugs and hence is at high risk of bacteremia and septic emboli. Neurology noted left hemianopsia with right gaze preference earlier on examination prior to intubation. Differential also includes encephalitis or meningitis, especially HSV encephalitis given her agitation. Still waiting on CT scans due to patient's agitation.  - Neurology following  - CTA Head and Neck, Cerebral perfusion  - Blood + Urine cultures  - Lactic acid  - Procalcitonin  - Hold antibiotics at this time until cultures are drawn  # Acute Agitation - Uncertain on etiology; stroke-related versus withdrawal versus psychological. Since patient is now intubated and sedated on Fentanyl and Propofol.  - Intubated for airway protection. - VAP measures in place  - Continue Fentanyl and Propofol gtt for RASS -1   Best practice:  Diet: NPO Pain/Anxiety/Delirium protocol (if indicated): Fentanyl + Propofol  VAP protocol (if indicated): Ordered DVT  prophylaxis: SCDs GI prophylaxis: Protonix Glucose control: N/A  Mobility: Bedrest  Code Status: FULL  Family Communication: Will update father  Disposition: ICU  Labs   CBC: Recent Labs  Lab 12/31/19 1300 12/31/19 1304  WBC 7.9  --   NEUTROABS 5.6  --   HGB 13.6 13.6  HCT 40.2 40.0  MCV 96.6  --   PLT 263  --     Basic Metabolic Panel: Recent Labs  Lab 12/31/19 1300 12/31/19 1304  NA 137 140  K 3.3* 3.3*  CL 103 105  CO2 24  --   GLUCOSE 136* 134*  BUN <5* 4*  CREATININE 0.71 0.60  CALCIUM 9.3  --    GFR: Estimated Creatinine Clearance: 109.4 mL/min (by C-G formula based on SCr of 0.6 mg/dL). Recent Labs  Lab 12/31/19 1300  WBC 7.9    Liver Function Tests: Recent Labs  Lab 12/31/19 1300  AST 19  ALT 24  ALKPHOS 50  BILITOT 0.6  PROT 7.8  ALBUMIN 4.3   No results for input(s): LIPASE, AMYLASE in the last 168 hours. No results for  input(s): AMMONIA in the last 168 hours.  ABG    Component Value Date/Time   TCO2 24 12/31/2019 1304     Coagulation Profile: Recent Labs  Lab 12/31/19 1300  INR 1.0    Cardiac Enzymes: No results for input(s): CKTOTAL, CKMB, CKMBINDEX, TROPONINI in the last 168 hours.  HbA1C: No results found for: HGBA1C  CBG: Recent Labs  Lab 12/31/19 1258  GLUCAP 137*    Review of Systems:   Unable to obtain due to AMS  Past Medical History  She,  has a past medical history of Abnormal Pap smear, GBS (group B streptococcus) UTI complicating pregnancy (4/31/5400), IC (interstitial cystitis) (04/17/2012), Interstitial cystitis, Ovarian cyst, Protein in urine, and Renal abscess.   Surgical History    Past Surgical History:  Procedure Laterality Date  . DILATION AND CURETTAGE OF UTERUS       Social History   reports that she has been smoking cigarettes. She has a 2.00 pack-year smoking history. She has never used smokeless tobacco. She reports that she does not drink alcohol and does not use drugs.   Family  History   Her family history includes Cancer in her maternal grandmother, maternal uncle, and paternal grandmother; Diabetes in her maternal grandmother and paternal grandmother. There is no history of Other.   Allergies Allergies  Allergen Reactions  . Clarithromycin Anaphylaxis    Biaxin     Home Medications  Prior to Admission medications   Medication Sig Start Date End Date Taking? Authorizing Provider  etonogestrel (NEXPLANON) 68 MG IMPL implant 1 each by Subdermal route once.    [provider]     Dr. Jose Persia Internal Medicine PGY-2  12/31/2019, 3:16 PM

## 2019-12-31 NOTE — Code Documentation (Signed)
Stroke Response Nurse Documentation Code Documentation  Amber [name] is a 29 y.o. female arriving to [facility] ED via [facility] on 12/31/2019 with past medical hx of heroin use  On Methadone. Pt reports having headache over the weekend and not feeling well. Reported vomiting x 2. She took an Excedrin and continued to have headache. This morning, she drove her daughter to meet her uncle and she felt unsafe to drive home. Her uncle drove her home and called EMS after she was noted to be acting strangely and being unable to walk correctly. Code stroke was activated by EMS.   Stroke team at the bedside on patient arrival. Labs drawn and patient cleared for CT by Dr. Johnney Killian. Patient to CT with team. NIHSS 15, see documentation for details and code stroke times. Patient with disoriented, right gaze preference , left hemianopia, bilateral arm weakness, bilateral leg weakness and left neglect on exam.   Pt was combative and would not cooperate for CT. Attempted multiple times to get IV. Unable to Access. Ativan and Haldol given IM while in CT. No improvement. MD Pfeiffer came and attempted [place] IV. Access lost after patient quickly moved. Decision made to return patient to the room, get IV access, and medicate. Pt returned to Room 19.   1340- Attempted [place] IV again. Pt remained combative. Unable to obtain. Decision made by MD to intubate. See ED RN documentation for medications and intubations time.   Finally obtained IV access for patient to be intubated.   1440 - Meds given for intubation. MD intubated. Pt had to be restrained and EJ placed after intubation.   1520 - Xray completed to confirm placement. Pt continued to be monitored.   At 1540 - Pt transferred to CT to rule out bleed. CT, CTA, CTP completed. No hemorrhage or LVO per MD Leonel Ramsay.  Pt is not a tPA candidate due to being outside window. LKW unclear due to patient's neglect of the left side and unknown hemianopia.  Pt to have q2 mNIHSS/VS and be admitted.   Amber [name]  Stroke Response RN

## 2019-12-31 NOTE — Progress Notes (Signed)
EEG complete - results pending 

## 2019-12-31 NOTE — Progress Notes (Signed)
RT transported pt to and from MRI without complications. ?

## 2019-12-31 NOTE — Consult Note (Signed)
Neurology Consultation  Reason for Consult: Neglect Referring Physician: Calla Kicks  CC: Neglect  History is obtained from: Both EMS and patient  HPI: Amber [name] is a 29 y.o. female proteinuria, group B streptococcus and urine, and migraine headaches.  Patient states that this past Saturday she did have a migraine headache with pain over the right temporal lobe.  Prior to that on 17 October she also had 2 teeth removed without complications.  She does use heroin but states that last time she had been using heroin was approximately 1 week ago.  She also takes methadone from the methadone clinic and apparently did pick up some methadone this morning.  She went to work this morning when suddenly she started noticing that she was having difficulty walking, right foot dragging then right arm tingling and decreased sensation which then later while in the EMS vehicle switch to the left leg and left side.  She currently is having a severe right temporal headache which she states is a 10/10.  Throw multiple attempts to get CT her main complaint at this time is the headache.  She does not complain of any nausea vomiting.  Multiple attempts to get patient to stay still in the MRI were attempted however were futile.  Patient had to be brought back to the room in order to give more sedating m in order to obtain CT of head.  LKW: Unclear tpa given?: no, due to unclear last known normal Premorbid modified Rankin scale (mRS): 0     Past Medical History:  Diagnosis Date  . Abnormal Pap smear   . GBS (group B streptococcus) UTI complicating pregnancy 10/22/5629  . IC (interstitial cystitis) 04/17/2012  . Interstitial cystitis   . Ovarian cyst   . Protein in urine   . Renal abscess     Family History  Problem Relation Age of Onset  . Cancer Maternal Uncle   . Diabetes Maternal Grandmother   . Cancer Maternal Grandmother        colon  . Diabetes Paternal Grandmother   . Cancer Paternal Grandmother         bone  . Other Neg Hx    Social History:   reports that she has been smoking cigarettes. She has a 2.00 pack-year smoking history. She has never used smokeless tobacco. She reports that she does not drink alcohol and does not use drugs.  Medications  Current Facility-Administered Medications:  .  LORazepam (ATIVAN) 2 MG/ML injection, , , ,  .  sodium chloride flush (NS) 0.9 % injection 3 mL, 3 mL, Intravenous, Once, Pfeiffer, Marcy, MD  Current Outpatient Medications:  .  etonogestrel (NEXPLANON) 68 MG IMPL implant, 1 each by Subdermal route once., Disp: , Rfl:   ROS: Very difficult to obtain as patient is very agitated and having difficulty getting her to answer questions.  General ROS: -positive for headache Psychological ROS: negative for - behavioral disorder, hallucinations, memory difficulties, mood swings or suicidal ideation Ophthalmic ROS: Positive for -  eye pain  Endocrine ROS: negative for - galactorrhea, hair pattern changes, polydipsia/polyuria or temperature intolerance Respiratory ROS: negative for - cough, hemoptysis, shortness of breath or wheezing Cardiovascular ROS: negative for - chest pain, dyspnea on exertion, edema or irregular heartbeat Gastrointestinal ROS: negative for - abdominal pain, diarrhea, hematemesis, nausea/vomiting or stool incontinence Genito-Urinary ROS: negative for - dysuria, hematuria, incontinence or urinary frequency/urgency Musculoskeletal ROS: Positive for -  muscular weakness Neurological ROS: as noted in HPI Dermatological ROS:  negative for rash and skin lesion changes  Exam: Current vital signs: Ht 5\' 5"  (1.651 m)   Wt 84.4 kg   BMI 30.96 kg/m  Vital signs in last 24 hours: Weight:  [84.4 kg] 84.4 kg (10/26 1200)   Constitutional: Appears well-developed and well-nourished.  Psych: Very agitated Eyes: No scleral injection HENT: No OP obstrucion Head: Normocephalic.  Cardiovascular: Palpable  respiratory: Effort normal,  non-labored breathing GI: Soft.  No distension. There is no tenderness.  Skin: WDI  Neuro: Mental Status: Patient is awake, alert, agitated, able to give history of prior events that led her to the hospital.  Follows some commands when calm. Cranial Nerves: II: Left hemianopsia.  III,IV, VI: Right gaze preference. pupils equal, round and reactive to light V: Facial sensation is symmetric to temperature VII: Facial movement is symmetric.  VIII: hearing is intact to voice X: Palat elevates symmetrically XI: Shoulder shrug is symmetric. XII: tongue is midline without atrophy or fasciculations.  Motor: Tone is normal. Bulk is normal. 5/5 strength was present in all four extremities.  Sensory: She responds to nox stim bilaterally, but senses stimulation ot the left as coming from the right.  DSS Cerebellar: Does not perform.   Labs I have reviewed labs in epic and the results pertinent to this consultation are:  CBC    Component Value Date/Time   WBC 7.9 12/31/2019 1300   RBC 4.16 12/31/2019 1300   HGB 13.6 12/31/2019 1304   HCT 40.0 12/31/2019 1304   PLT 263 12/31/2019 1300   MCV 96.6 12/31/2019 1300   MCH 32.7 12/31/2019 1300   MCHC 33.8 12/31/2019 1300   RDW 12.4 12/31/2019 1300   LYMPHSABS 1.8 12/31/2019 1300   MONOABS 0.4 12/31/2019 1300   EOSABS 0.1 12/31/2019 1300   BASOSABS 0.0 12/31/2019 1300    CMP     Component Value Date/Time   NA 140 12/31/2019 1304   K 3.3 (L) 12/31/2019 1304   CL 105 12/31/2019 1304   CO2 24 12/31/2019 1300   GLUCOSE 134 (H) 12/31/2019 1304   BUN 4 (L) 12/31/2019 1304   CREATININE 0.60 12/31/2019 1304   CALCIUM 9.3 12/31/2019 1300   PROT 7.8 12/31/2019 1300   ALBUMIN 4.3 12/31/2019 1300   AST 19 12/31/2019 1300   ALT 24 12/31/2019 1300   ALKPHOS 50 12/31/2019 1300   BILITOT 0.6 12/31/2019 1300   GFRNONAA >60 12/31/2019 1300   GFRAA >90 08/06/2013 0223    Lipid Panel  No results found for: CHOL, TRIG, HDL, CHOLHDL, VLDL,  LDLCALC, LDLDIRECT   Imaging I have reviewed the images obtained:  CT-scan of the brain  MRI examination of the brain  Etta Quill PA-C [facility] [phone]  M-F  (9:00 am- 5:00 PM)  12/31/2019, 1:51 PM   Assessment: 29 yo F with h/o IV drug use who presents with left hemianopia, left neglect. With her being agnostic to visual symptoms and having something happen this past weekend, I think that last known well is decidedly unclear.  She was wildly agitated and was unable to get a CT scan despite Ativan, Haldol, morphine.  Due to this, after discussion with the ED physician, she was intubated in order to get diagnostic testing. This did show an area of decreased perfusion in the right parietal region consistent with her symptoms.  I suspect that it is likely an embolic infarct, with her history of drug use, I think that septic embolism is a very likely possibility.  Recommendations: - HgbA1c, fasting lipid panel - MRI of the brain without contrast - Frequent neuro checks - Echocardiogram - Prophylactic therapy-Antiplatelet med: Aspirin - dose 325mg  PO or 300mg  PR - Risk factor modification - Telemetry monitoring - PT consult, OT consult, Speech consult - Stroke team to follow  Roland Rack, MD [facility] [phone]  If 7pm- 7am, please page neurology on call as listed in [place].

## 2019-12-31 NOTE — Progress Notes (Signed)
eLink Physician-Brief Progress Note Patient Name: Amber [name] DOB: January 04, 1991 MRN: [id number]   Date of Service  12/31/2019  HPI/Events of Note  Agitation - Request for bilateral soft wrist restraints.  eICU Interventions  Will order bilateral soft wrist restraints for 13 hours.      Intervention Category Major Interventions: Delirium, psychosis, severe agitation - evaluation and management  , [place] 12/31/2019, 8:22 PM

## 2020-01-01 ENCOUNTER — Inpatient Hospital Stay (HOSPITAL_COMMUNITY): Payer: Medicaid Other

## 2020-01-01 DIAGNOSIS — I6389 Other cerebral infarction: Secondary | ICD-10-CM

## 2020-01-01 DIAGNOSIS — R569 Unspecified convulsions: Secondary | ICD-10-CM

## 2020-01-01 DIAGNOSIS — F309 Manic episode, unspecified: Secondary | ICD-10-CM | POA: Diagnosis not present

## 2020-01-01 DIAGNOSIS — J9601 Acute respiratory failure with hypoxia: Secondary | ICD-10-CM

## 2020-01-01 DIAGNOSIS — R531 Weakness: Secondary | ICD-10-CM | POA: Diagnosis not present

## 2020-01-01 LAB — ECHOCARDIOGRAM COMPLETE
Area-P 1/2: 3.03 cm2
Height: 65 in
S' Lateral: 3 cm
Weight: 3044.11 oz

## 2020-01-01 LAB — BASIC METABOLIC PANEL
Anion gap: 7 (ref 5–15)
BUN: <5 mg/dL — ABNORMAL LOW (ref 6–20)
CO2: 23 mmol/L (ref 22–32)
Calcium: 8.4 mg/dL — ABNORMAL LOW (ref 8.9–10.3)
Chloride: 109 mmol/L (ref 98–111)
Creatinine, Ser: 0.62 mg/dL (ref 0.44–1.00)
GFR, Estimated: >60 mL/min (ref >60)
Glucose, Bld: 118 mg/dL — ABNORMAL HIGH (ref 70–99)
Potassium: 3.4 mmol/L — ABNORMAL LOW (ref 3.5–5.1)
Sodium: 139 mmol/L (ref 135–145)

## 2020-01-01 LAB — CBC
HCT: 37.5 % (ref 36.0–46.0)
Hemoglobin: 12.5 g/dL (ref 12.0–15.0)
MCH: 32.2 pg (ref 26.0–34.0)
MCHC: 33.3 g/dL (ref 30.0–36.0)
MCV: 96.6 fL (ref 80.0–100.0)
Platelets: 201 10*3/uL (ref 150–400)
RBC: 3.88 MIL/uL (ref 3.87–5.11)
RDW: 12.4 % (ref 11.5–15.5)
WBC: 8.1 10*3/uL (ref 4.0–10.5)
nRBC: 0 % (ref 0.0–0.2)

## 2020-01-01 LAB — PHOSPHORUS: Phosphorus: 3.2 mg/dL (ref 2.5–4.6)

## 2020-01-01 LAB — LIPID PANEL
Cholesterol: 166 mg/dL (ref 0–200)
HDL: 33 mg/dL — ABNORMAL LOW (ref >40)
LDL Cholesterol: 111 mg/dL — ABNORMAL HIGH (ref 0–99)
Total CHOL/HDL Ratio: 5 RATIO
Triglycerides: 110 mg/dL (ref <150)
VLDL: 22 mg/dL (ref 0–40)

## 2020-01-01 LAB — PROCALCITONIN: Procalcitonin: <0.1 ng/mL

## 2020-01-01 LAB — MAGNESIUM: Magnesium: 1.8 mg/dL (ref 1.7–2.4)

## 2020-01-01 MED ORDER — LORAZEPAM 2 MG/ML IJ SOLN
INTRAMUSCULAR | Status: AC
  Filled 2020-01-01: qty 2

## 2020-01-01 MED ORDER — LORAZEPAM 2 MG/ML IJ SOLN: 2.0000 mg | INTRAMUSCULAR | Status: DC | PRN

## 2020-01-01 MED ORDER — IBUPROFEN 200 MG PO TABS
400.0000 mg | ORAL_TABLET | Freq: Four times a day (QID) | ORAL | Status: DC | PRN
  Administered 2020-01-01 – 2020-01-05 (×6): 400 mg via ORAL
  Filled 2020-01-01 (×6): qty 2

## 2020-01-01 MED ORDER — MAGNESIUM SULFATE 2 GM/50ML IV SOLN
2.0000 g | Freq: Once | INTRAVENOUS | Status: AC
  Administered 2020-01-01: 2 g via INTRAVENOUS
  Filled 2020-01-01: qty 50

## 2020-01-01 MED ORDER — HYDROMORPHONE HCL 1 MG/ML IJ SOLN
1.0000 mg | Freq: Once | INTRAMUSCULAR | Status: AC
  Administered 2020-01-01: 1 mg via INTRAVENOUS
  Filled 2020-01-01: qty 1

## 2020-01-01 MED ORDER — METHADONE HCL 10 MG PO TABS
40.0000 mg | ORAL_TABLET | Freq: Every day | ORAL | Status: DC
  Administered 2020-01-01 (×2): 40 mg via ORAL
  Filled 2020-01-01: qty 4
  Filled 2020-01-01: qty 8

## 2020-01-01 MED ORDER — POTASSIUM CHLORIDE 20 MEQ/15ML (10%) PO SOLN
40.0000 meq | Freq: Once | ORAL | Status: AC
  Administered 2020-01-01: 40 meq
  Filled 2020-01-01: qty 30

## 2020-01-01 MED ORDER — SODIUM CHLORIDE 0.9 % IV SOLN
INTRAVENOUS | Status: DC | PRN
  Administered 2020-01-01: 250 mL via INTRAVENOUS

## 2020-01-01 MED ORDER — TOPIRAMATE 25 MG PO TABS
50.0000 mg | ORAL_TABLET | Freq: Two times a day (BID) | ORAL | Status: DC
  Administered 2020-01-01 – 2020-01-07 (×12): 50 mg via ORAL
  Filled 2020-01-01 (×12): qty 2

## 2020-01-01 MED ORDER — METHADONE HCL 10 MG PO TABS
40.0000 mg | ORAL_TABLET | Freq: Once | ORAL | Status: AC
  Filled 2020-01-01: qty 4

## 2020-01-01 MED ORDER — LORAZEPAM 2 MG/ML IJ SOLN
2.0000 mg | INTRAMUSCULAR | Status: DC | PRN
  Administered 2020-01-01 – 2020-01-02 (×4): 4 mg via INTRAVENOUS
  Administered 2020-01-05: 2 mg via INTRAVENOUS
  Administered 2020-01-05: 4 mg via INTRAVENOUS
  Filled 2020-01-01 (×4): qty 2
  Filled 2020-01-01: qty 1

## 2020-01-01 NOTE — Progress Notes (Signed)
NAME:  Amber [name], MRN:  [id number], DOB:  05-Jan-1991, LOS: 1 ADMISSION DATE:  12/31/2019, CONSULTATION DATE:  12/31/2019 REFERRING MD:  Dr. Johnney Killian, CHIEF COMPLAINT:  Left side neglect    Brief History   Ms. Amber [name] is a 29 y/o female with a PMHx of IVDU who presents to the ED with c/o left-sided neglect concerning for embolic stroke.   History of present illness   Ms. Amber [name] is a 29 y/o female with a PMHx of IVDU who presents to the ED with c/o left-sided neglect concerning for embolic stroke. Patient is intubated and sedated on examination. History per Neurology.   "Amber [name] is a 29 y.o. female with hx of proteinuria, group B streptococcus and urine, and migraine headaches.  Patient states that this past Saturday she did have a migraine headache with pain over the right temporal lobe.  Prior to that on 17 October she also had 2 teeth removed without complications.  She does use heroin but states that last time she had been using heroin was approximately 1 week ago.  She also takes methadone from the methadone clinic and apparently did pick up some methadone this morning.  She went to work this morning when suddenly she started noticing that she was having difficulty walking, right foot dragging then right arm tingling and decreased sensation which then later while in the EMS vehicle switch to the left leg and left side.  She currently is having a severe right temporal headache which she states is a 10/10.  Through multiple attempts to get CT her main complaint at this time is the headache.  She does not complain of any nausea vomiting.  Multiple attempts to get patient to stay still in the MRI were attempted however were futile.  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  Scattered supratentorial and infratentorial chronic microhemorrhages which are greater than expected for age and nonspecific.  Mild paranasal sinus mucosal thickening.  Micro Data:  Blood Cultures (10/26) >> Pending Urine Cultures (10/26) >> Pending   Antimicrobials:  Cefepime 10/26>> Vancomycin 10/26>>  Interim history/subjective:  No overnight events Occasional agitation, still not redirectable at present On vent, stable hemodynamics  Objective   Blood pressure 110/74, pulse 78, temperature 97.6 F (36.4 C), temperature source Axillary, resp. rate 18, height 5\' 5"  (1.651 m), weight 86.3 kg, SpO2 100 %.    Vent Mode: PRVC FiO2 (%):  [40 %-100 %] 40 % Set Rate:  [15 bmp] 15 bmp Vt Set:  [450 mL-500 mL] 450 mL PEEP:  [5 cmH20] 5 cmH20 Plateau Pressure:  [15 cmH20] 15 cmH20   Intake/Output Summary (Last 24 hours) at 01/01/2020 0809 Last data filed at 01/01/2020 0700 Gross per 24 hour  Intake 1843.4 ml  Output --  Net 1843.4 ml   Filed Weights   12/31/19 1200 01/01/20 0426  Weight: 84.4 kg 86.3 kg   Examination: General: Intubated and sedated, in no acute distress HENT: Atraumatic, endotracheal tube in place, moist oral mucosa Neck: Supple, no thyromegaly Chest: Clear breath  sounds bilaterally CVS: S1-S2 appreciated with no murmur Abdomen: Soft, bowel sounds appreciated Extremities: No edema, no clubbing  Chest x-ray reviewed showing no acute infiltrates  Resolved Hospital Problem list   N/A  Assessment & Plan:   Marland Kitchen Acute Left-sided Weakness -Negative CT head -Negative MRI head -U tox positive for benzodiazepines, opiates -Other labs are unremarkable at present -Blood cultures negative at present, MRSA by PCR  negative -Neurology continues to follow  .  Acute agitation -Etiology is uncertain at the present time -Concern for possible withdrawal although appears to still be taking methadone on a regular basis -Intubated for airway protection -Continue medications -Has required multiple medications, trying to tailor this down -Had been on Versed, propofol, Precedex, fentanyl  Echocardiogram pending follow blood cultures Will keep intubated at the present time  Continue antibiotic treatment at present  Best practice:  Diet: NPO Pain/Anxiety/Delirium protocol (if indicated): Fentanyl, Precedex VAP protocol (if indicated): Ordered DVT prophylaxis: SCDs GI prophylaxis: Protonix Glucose control: N/A  Mobility: Bedrest  Code Status: FULL  Family Communication: Discussed with mother at bedside Disposition: ICU  Labs   CBC: Recent Labs  Lab 12/31/19 1300 12/31/19 1304 01/01/20 0124  WBC 7.9  --  8.1  NEUTROABS 5.6  --   --   HGB 13.6 13.6 12.5  HCT 40.2 40.0 37.5  MCV 96.6  --  96.6  PLT 263  --  417    Basic Metabolic Panel: Recent Labs  Lab 12/31/19 1300 12/31/19 1304 01/01/20 0124  NA 137 140 139  K 3.3* 3.3* 3.4*  CL 103 105 109  CO2 24  --  23  GLUCOSE 136* 134* 118*  BUN <5* 4* <5*  CREATININE 0.71 0.60 0.62  CALCIUM 9.3  --  8.4*  MG  --   --  1.8  PHOS  --   --  3.2   GFR: Estimated Creatinine Clearance: 110.5 mL/min (by C-G formula based on SCr of 0.62 mg/dL). Recent Labs  Lab 12/31/19 1300 12/31/19 1615 12/31/19 2000 01/01/20 0124  PROCALCITON  --  <0.10  --  <0.10  WBC 7.9  --   --  8.1  LATICACIDVEN  --  1.5 0.7  --     Liver Function Tests: Recent Labs  Lab 12/31/19 1300  AST 19  ALT 24  ALKPHOS 50  BILITOT 0.6  PROT 7.8  ALBUMIN 4.3   No results for input(s): LIPASE, AMYLASE in the last 168 hours. No results for input(s): AMMONIA in the last 168 hours.  ABG    Component Value Date/Time   PHART 7.339 (L) 12/31/2019 1835   PCO2ART  46.6 12/31/2019 1835   PO2ART 155 (H) 12/31/2019 1835   HCO3 24.4 12/31/2019 1835   TCO2 24 12/31/2019 1304   ACIDBASEDEF 0.6 12/31/2019 1835   O2SAT 98.9 12/31/2019 1835     Coagulation Profile: Recent Labs  Lab 12/31/19 1300  INR 1.0    Cardiac Enzymes: No results for input(s): CKTOTAL, CKMB, CKMBINDEX, TROPONINI in the last 168 hours.  HbA1C: No results found for: HGBA1C  CBG: Recent Labs  Lab 12/31/19 1258 12/31/19 2316  GLUCAP 137* 137*    Review of Systems:   Unable to obtain due to AMS  Past Medical History  She,  has a past medical history of Abnormal Pap smear, GBS (group B streptococcus) UTI complicating pregnancy (06/13/1446), IC (interstitial cystitis) (04/17/2012), Interstitial cystitis, Ovarian cyst, Protein in urine, and Renal abscess.   Surgical History    Past Surgical  History:  Procedure Laterality Date  . DILATION AND CURETTAGE OF UTERUS       Social History   reports that she has been smoking cigarettes. She has a 2.00 pack-year smoking history. She has never used smokeless tobacco. She reports that she does not drink alcohol and does not use drugs.   Family History   Her family history includes Cancer in her maternal grandmother, maternal uncle, and paternal grandmother; Diabetes in her maternal grandmother and paternal grandmother. There is no history of Other.   Allergies Allergies  Allergen Reactions  . Clarithromycin Anaphylaxis    Biaxin     Home Medications  Prior to Admission medications   Medication Sig Start Date End Date Taking? Authorizing Provider  etonogestrel (NEXPLANON) 68 MG IMPL implant 1 each by Subdermal route once.    [provider]     The patient is critically ill with multiple organ systems failure and requires high complexity decision making for assessment and support, frequent evaluation and titration of therapies, application of advanced monitoring technologies and extensive interpretation of multiple  databases. Critical Care Time devoted to patient care services described in this note independent of APP/resident time (if applicable)  is 32 minutes.   Sherrilyn Rist MD [facility] Personal pager: [phone] If unanswered, please page CCM On-call: [phone]

## 2020-01-01 NOTE — Plan of Care (Signed)
29 yo f w IVDU Admitted with left-sided weakness, was intubated for agitation. Extubated today with no focal findings. IVDU history. On methadone. MRI head negative. Neurology following  Transfer to [facility] in AM    6:55 PM

## 2020-01-01 NOTE — Progress Notes (Signed)
Spoke w Miachel Roux who clarified they want EEG done after patient is extubated. Spoke w RN and asked her to call when they have extubated the patient.

## 2020-01-01 NOTE — Procedures (Signed)
Patient Name: Amber [name]  MRN: [id number]  Epilepsy Attending: Lora Havens  Referring Physician/Provider: Dr. Kathrynn Speed Date: 12/31/2019 Duration: 23.17 minutes  Patient history: 29 year old female with history of IV drug use presented with left hemianopsia and neglect.  EEG to evaluate for seizure.  Level of alertness: Comatose  AEDs during EEG study: Propofol, Versed, phenobarbital  Technical aspects: This EEG study was done with scalp electrodes positioned according to the 10-20 International system of electrode placement. Electrical activity was acquired at a sampling rate of 500Hz  and reviewed with a high frequency filter of 70Hz  and a low frequency filter of 1Hz . EEG data were recorded continuously and digitally stored.   Description: EEG showed burst suppression pattern with 5-6 seconds of EEG suppression alternating with 2 to 3seconds polymorphic 8-15 Hz alpha-beta activity. Hyperventilation and photic stimulation were not performed.     ABNORMALITY -Burst suppression, generalized  IMPRESSION: This study is suggestive of profound diffuse encephalopathy, nonspecific etiology but likely related to sedation.  No seizures or epileptiform discharges were seen throughout the recording.    Barbra Sarks

## 2020-01-01 NOTE — Procedures (Signed)
Extubation Procedure Note  Patient Details:   Name: Amber [name] DOB: 1990-04-28 MRN: [id number]   Airway Documentation:    Vent end date: 01/01/20 Vent end time: 1200   Evaluation  O2 sats: stable throughout Complications: No apparent complications Patient did tolerate procedure well. Bilateral Breath Sounds: Clear, Diminished   Yes   Prior to extubation pt did have positive cuff leak. Pt was extubated to [facility]. Pt tolerated well with SVS. Pt was able to state her name. RT will continue to monitor pt.  Rossie Muskrat R 01/01/2020, 12:12 PM

## 2020-01-01 NOTE — Evaluation (Signed)
Occupational Therapy Evaluation Patient Details Name: Amber [name] MRN: [id number] DOB: [birth year] Today's Date: 01/01/2020    History of Present Illness Pt is is a 29 y/o female with a PMHx of IVDU who presents to the ED with c/o left-sided neglect, weakness concerning for embolic stroke. MRI with no acute findings. Revealed scattered supratentorial and infratentorial chronic microhemorrhages. EEG completed 10/27 and suggestive of profound diffuse encephalopathy, nonspecific etiology but likely related to sedation.    Clinical Impression   This 29 y/o female presents with the above. PTA pt reports independence with ADL, iADL and functional mobility. Pt now presenting with the above and below listed deficits including decreased safety awareness, impulsivity and LUE deficits. Pt initially requiring minA (+2 safety) for mobility tasks progressed to close minguard assist; requiring up to minA for ADL. Pt requiring frequent cues for safety due to impulsivity and due to poor awareness of L visual field/L side of body. Pt to benefit from continued acute OT services and recommend follow up outpt neuro OT services to further address cognition and LUE deficits.     Follow Up Recommendations  Outpatient OT;Supervision/Assistance - 24 hour (outpt neuro OT)    Equipment Recommendations  None recommended by OT           Precautions / Restrictions Precautions Precautions: Fall Restrictions Weight Bearing Restrictions: No      Mobility Bed Mobility Overal bed mobility: Needs Assistance Bed Mobility: Supine to Sit;Sit to Supine     Supine to sit: Supervision Sit to supine: Supervision   General bed mobility comments: for safety/lines, pt quick to move     Transfers Overall transfer level: Needs assistance Equipment used: None Transfers: Sit to/from Stand Sit to Stand: Min guard         General transfer comment: close guarding for safety and balance; stood from EOB and toilet      Balance Overall balance assessment: Needs assistance Sitting-balance support: Feet supported Sitting balance-Leahy Scale: Fair     Standing balance support: No upper extremity supported;During functional activity Standing balance-Leahy Scale: Fair Standing balance comment: intermittent sway and minor LOB to the L with some mobility tasks                            ADL either performed or assessed with clinical judgement   ADL Overall ADL's : Needs assistance/impaired Eating/Feeding: Modified independent;Sitting   Grooming: Min guard;Standing;Wash/dry hands   Upper Body Bathing: Min guard;Sitting   Lower Body Bathing: Minimal assistance;Sit to/from stand   Upper Body Dressing : Min guard;Sitting   Lower Body Dressing: Minimal assistance;Sit to/from stand Lower Body Dressing Details (indicate cue type and reason): to don L sock Toilet Transfer: Minimal assistance;+2 for safety/equipment;Ambulation Toilet Transfer Details (indicate cue type and reason): progressed to [employer] Toileting- Water quality scientist and Hygiene: Min guard;Sit to/from stand Toileting - Clothing Manipulation Details (indicate cue type and reason): for safety and balance with pericare      Functional mobility during ADLs: Min guard General ADL Comments: pt with decreased attention to L visual field, poor safety awareness and impulsivity      Vision Baseline Vision/History: Wears glasses (contacts) Wears Glasses: At all times Patient Visual Report: No change from baseline Vision Assessment?: Vision impaired- to be further tested in functional context     Perception Perception Perception Tested?: Yes Perception Deficits: Inattention/neglect Inattention/Neglect: Does not attend to left visual field;Does not attend to left side of body;Impaired- to be  further tested in functional context Comments: pt's L arm out of gown when sitting up, pt attemting to stand and go and completely unaware, LUE  initially bent behind her when transitioning to EOB    Praxis      Pertinent Vitals/Pain Pain Assessment: Faces Faces Pain Scale: Hurts little more Pain Location: headache Pain Descriptors / Indicators: Headache Pain Intervention(s): Limited activity within patient's tolerance;Monitored during session     Hand Dominance Left   Extremity/Trunk Assessment Upper Extremity Assessment Upper Extremity Assessment: LUE deficits/detail LUE Deficits / Details: grossly 4-/5, decreased fine motor, decreased attention to LUE LUE Coordination: decreased fine motor   Lower Extremity Assessment Lower Extremity Assessment: Defer to PT evaluation       Communication Communication Communication: No difficulties   Cognition Arousal/Alertness: Awake/alert Behavior During Therapy: Impulsive Overall Cognitive Status: Impaired/Different from baseline Area of Impairment: Safety/judgement                         Safety/Judgement: Decreased awareness of safety;Decreased awareness of deficits     General Comments: pt impulsive to move and requires frequent cues for safety, for attending to L visual field    General Comments  pt tachy initially with activity up to 141 bpm (max)    Exercises     Shoulder Instructions      Home Living Family/patient expects to be discharged to:: Private residence Living Arrangements: Parent ("pops") Available Help at Discharge: Family Type of Home: House Home Access: Stairs to enter Technical brewer of Steps: "few" Entrance Stairs-Rails: Right;Left;Can reach both Home Layout: One level     Bathroom Shower/Tub: Teacher, early years/pre: Standard     Home Equipment: None          Prior Functioning/Environment Level of Independence: Independent        Comments: working        OT Problem List: Decreased strength;Decreased range of motion;Decreased activity tolerance;Impaired balance (sitting and/or  standing);Decreased coordination;Decreased cognition;Impaired vision/perception;Decreased safety awareness;Impaired UE functional use      OT Treatment/Interventions: Self-care/ADL training;Therapeutic exercise;Energy conservation;DME and/or AE instruction;Therapeutic activities;Cognitive remediation/compensation;Visual/perceptual remediation/compensation;Patient/family education;Balance training    OT Goals(Current goals can be found in the care plan section) Acute Rehab OT Goals Patient Stated Goal: home OT Goal Formulation: With patient Time For Goal Achievement: 01/15/20 Potential to Achieve Goals: Good  OT Frequency: Min 2X/week   Barriers to D/C:            Co-evaluation PT/OT/SLP Co-Evaluation/Treatment: Yes Reason for Co-Treatment: Necessary to address cognition/behavior during functional activity;For patient/therapist safety   OT goals addressed during session: ADL's and self-care      AM-PAC OT "6 Clicks" Daily Activity     Outcome Measure Help from another person eating meals?: None Help from another person taking care of personal grooming?: A Little Help from another person toileting, which includes using toliet, bedpan, or urinal?: A Little Help from another person bathing (including washing, rinsing, drying)?: A Little Help from another person to put on and taking off regular upper body clothing?: A Little Help from another person to put on and taking off regular lower body clothing?: A Little 6 Click Score: 19   End of Session Nurse Communication: Mobility status  Activity Tolerance: Patient tolerated treatment well Patient left: in bed;with call bell/phone within reach;with bed alarm set;with family/visitor present  OT Visit Diagnosis: Other symptoms and signs involving cognitive function;Other symptoms and signs involving the nervous system (R29.898);Muscle weakness (generalized) (  M62.81)                Time: 0867-6195 OT Time Calculation (min): 25  min Charges:  OT General Charges $OT Visit: 1 Visit OT Evaluation $OT Eval Moderate Complexity: [place], OT Acute Rehabilitation Services Pager [phone] Office [phone]   Raymondo Band 01/01/2020, 5:05 PM

## 2020-01-01 NOTE — Evaluation (Addendum)
Physical Therapy Evaluation Patient Details Name: Amber [name] MRN: [id number] DOB: 11/21/1990 Today's Date: 01/01/2020   History of Present Illness  Pt is is a 29 y/o female with a PMHx of IVDU who presents to the ED with c/o left-sided neglect, weakness concerning for embolic stroke. MRI with no acute findings. Revealed scattered supratentorial and infratentorial chronic microhemorrhages. EEG completed 10/27 and suggestive of profound diffuse encephalopathy, nonspecific etiology but likely related to sedation.   Clinical Impression  Pt admitted with/for left sided weakness and neglect with findings described above.  Pt needing min to supervision for basic mobility and gait.Marland Kitchen  Pt currently limited functionally due to the problems listed below.  (see problems list.)  Pt will benefit from PT to maximize function and safety to be able to get home safely with available assist.     Follow Up Recommendations Outpatient PT;Supervision - Intermittent;Supervision/Assistance - 24 hour    Equipment Recommendations  None recommended by PT    Recommendations for Other Services       Precautions / Restrictions Precautions Precautions: Fall Restrictions Weight Bearing Restrictions: No      Mobility  Bed Mobility Overal bed mobility: Needs Assistance Bed Mobility: Supine to Sit;Sit to Supine     Supine to sit: Supervision Sit to supine: Supervision   General bed mobility comments: for safety/lines, pt quick to move     Transfers Overall transfer level: Needs assistance Equipment used: None Transfers: Sit to/from Stand Sit to Stand: Min guard         General transfer comment: close guarding for safety and balance; stood from EOB and toilet   Ambulation/Gait Ambulation/Gait assistance: Min assist;+2 safety/equipment Gait Distance (Feet): 180 Feet Assistive device: None Gait Pattern/deviations: Step-through pattern   Gait velocity interpretation: 1.31 - 2.62 ft/sec, indicative  of limited community ambulator General Gait Details: episodes of unsteadiness with changes in speed and direction.  Dizziness with scanning .  Stairs Stairs: Yes Stairs assistance: Min assist Stair Management: One rail Right;Alternating pattern;Forwards Number of Stairs: 4 General stair comments: too impulsive and uncoordinated to negotiate steps without assist for safety  Wheelchair Mobility    Modified Rankin (Stroke Patients Only) Modified Rankin (Stroke Patients Only) Pre-Morbid Rankin Score: No significant disability Modified Rankin: Moderately severe disability     Balance Overall balance assessment: Needs assistance Sitting-balance support: Feet supported Sitting balance-Leahy Scale: Fair     Standing balance support: No upper extremity supported;During functional activity Standing balance-Leahy Scale: Fair Standing balance comment: intermittent sway and minor LOB to the L with some mobility tasks                              Pertinent Vitals/Pain Pain Assessment: Faces Faces Pain Scale: Hurts little more Pain Location: headache Pain Descriptors / Indicators: Headache Pain Intervention(s): Limited activity within patient's tolerance    Home Living Family/patient expects to be discharged to:: Private residence Living Arrangements: Parent ("pops") Available Help at Discharge: Family Type of Home: House Home Access: Stairs to enter Entrance Stairs-Rails: Right;Left;Can reach both Technical brewer of Steps: "few" Home Layout: One level Home Equipment: None      Prior Function Level of Independence: Independent         Comments: working     Engineer, 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 LLE Coordination: decreased fine motor       Communication   Communication: No difficulties  Cognition Arousal/Alertness: Awake/alert Behavior During Therapy: Impulsive Overall Cognitive Status: Impaired/Different from baseline Area of Impairment: Safety/judgement                         Safety/Judgement: Decreased awareness of safety;Decreased awareness of deficits     General Comments: pt impulsive to move and requires frequent cues for safety, for attending to L visual field       General Comments General comments (skin integrity, edema, etc.): pt tachy initially with activity up to 141 bpm (max)    Exercises     Assessment/Plan    PT Assessment Patient needs continued PT services  PT Problem List Decreased strength;Decreased activity tolerance;Decreased balance;Decreased mobility;Decreased coordination       PT Treatment Interventions DME instruction;Gait training    PT Goals (Current goals can be found in the Care Plan section)  Acute Rehab PT Goals Patient Stated Goal: home PT Goal Formulation: With patient Time For Goal Achievement: 01/15/20 Potential to Achieve Goals: Good    Frequency Min 3X/week   Barriers to discharge        Co-evaluation PT/OT/SLP Co-Evaluation/Treatment: Yes Reason for Co-Treatment: Necessary to address cognition/behavior during functional activity PT goals addressed during session: Mobility/safety with mobility OT goals addressed during session: ADL's and self-care       AM-PAC PT "6 Clicks" Mobility  Outcome Measure Help needed turning from your back to your side while in a flat bed without using bedrails?: None Help needed moving from lying on your back to sitting on the side of a flat bed without using bedrails?: None Help needed moving to and from a bed to a chair (including a  wheelchair)?: A Little Help needed standing up from a chair using your arms (e.g., wheelchair or bedside chair)?: A Little Help needed to walk in hospital room?: A Little Help needed climbing 3-5 steps with a railing? : A Little 6 Click Score: 20    End of Session   Activity Tolerance: Patient tolerated treatment well Patient left: in bed;with call bell/phone within reach;with bed alarm set;with family/visitor present Nurse Communication: Mobility status PT Visit Diagnosis: Unsteadiness on feet (R26.81);Other abnormalities of gait and mobility (R26.89);Other symptoms and signs involving the nervous system (R29.898)    Time: 1530-1609 PT Time Calculation (min) (ACUTE ONLY): 39 min   Charges:   PT Evaluation $PT Eval Moderate Complexity: 1 Mod          01/01/2020  Ginger Carne., PT Acute Rehabilitation Services [phone]  (pager) [phone]  (office)  Tessie Fass  01/01/2020, 5:35 PM

## 2020-01-01 NOTE — Progress Notes (Signed)
Patient arrive to [address] with the following items; 1 pair of pant, 1 sweatshirt, 1 shirt, 1 pair of shoes, 1 cell phone.  Cell phone was given to mother, susan bartley, to take home.

## 2020-01-01 NOTE — Progress Notes (Signed)
K+ 3.4, Mg 1.8 Replaced per protocol

## 2020-01-01 NOTE — Progress Notes (Signed)
OT Cancellation Note  Patient Details Name: AVAGAIL WHITTLESEY MRN: [id number] DOB: 1990-10-17   Cancelled Treatment:    Reason Eval/Treat Not Completed: Patient not medically ready; planning for extubation this AM. Will await extubation and follow up for OT eval as able.  Lou Cal, OT Acute Rehabilitation Services Pager [phone] Office [phone]   Raymondo Band 01/01/2020, 11:26 AM

## 2020-01-01 NOTE — Progress Notes (Signed)
EEG complete - results pending 

## 2020-01-01 NOTE — Progress Notes (Signed)
SLP Cancellation Note  Patient Details Name: Amber [name] MRN: [id number] DOB: June 13, 1990   Cancelled treatment:       Reason Eval/Treat Not Completed: Medical issues which prohibited therapy. Pt on vent, sedated per chart. Will f/u as able.   Osie Bond., M.A. [facility] Pager [phone] Office [phone]  01/01/2020, 8:23 AM

## 2020-01-01 NOTE — Progress Notes (Signed)
Patient extermely anxious and restless upon arival to shift.  Was told that she had a panic attack attempting to get Mri on day shift.  Spoke with neurology and was given an order for prn ativan.  Patient states that she will not be going down for a scan and that she could not do it. Alerted on call neuro MD to this.  Will give prn ativan and continue to observe.  Also received a order for a posey belt from CCM due to fear of patient jumping out of bed.

## 2020-01-01 NOTE — Progress Notes (Signed)
STROKE TEAM PROGRESS NOTE   INTERVAL HISTORY Her mother is at the bedside.  I have personally reviewed history of presenting illness, electronic medical records and imaging films in PACS.  Patient was intubated for agitation and presented with gaze deviation and left-sided neglect.  There is no definite witnessed seizure activity.  She remains sedated on Precedex and fentanyl but is arousable and follows simple midline commands and is able to move all 4 extremities against gravity.  She remains intubated for respiratory failure.  Vital signs are stable.  She has history of drug abuse in the past but urine drug screen is positive only for opiates and benzos at this time  Vitals:   01/01/20 0500 01/01/20 0600 01/01/20 0700 01/01/20 0800  BP: 93/61 (!) 89/60 110/74 (!) 91/58  Pulse: (!) 52 (!) 53 78 (!) 58  Resp: 16 17 18 16   Temp:    97.6 F (36.4 C)  TempSrc:    Axillary  SpO2: 98% 99% 100% 100%  Weight:      Height:       CBC:  Recent Labs  Lab 12/31/19 1300 12/31/19 1300 12/31/19 1304 01/01/20 0124  WBC 7.9  --   --  8.1  NEUTROABS 5.6  --   --   --   HGB 13.6   < > 13.6 12.5  HCT 40.2   < > 40.0 37.5  MCV 96.6  --   --  96.6  PLT 263  --   --  201   < > = values in this interval not displayed.   Basic Metabolic Panel:  Recent Labs  Lab 12/31/19 1300 12/31/19 1300 12/31/19 1304 01/01/20 0124  NA 137   < > 140 139  K 3.3*   < > 3.3* 3.4*  CL 103   < > 105 109  CO2 24  --   --  23  GLUCOSE 136*   < > 134* 118*  BUN <5*   < > 4* <5*  CREATININE 0.71   < > 0.60 0.62  CALCIUM 9.3  --   --  8.4*  MG  --   --   --  1.8  PHOS  --   --   --  3.2   < > = values in this interval not displayed.   Lipid Panel:  Recent Labs  Lab 01/01/20 0124  CHOL 166  TRIG 110  HDL 33*  CHOLHDL 5.0  VLDL 22  LDLCALC 111*   HgbA1c: No results for input(s): HGBA1C in the last 168 hours. Urine Drug Screen:  Recent Labs  Lab 12/31/19 2025  LABOPIA POSITIVE*  COCAINSCRNUR NONE  DETECTED  LABBENZ POSITIVE*  AMPHETMU NONE DETECTED  THCU NONE DETECTED  LABBARB NONE DETECTED    Alcohol Level No results for input(s): ETH in the last 168 hours.  IMAGING past 24 hours CT Code Stroke CTA Head W/WO contrast  Result Date: 12/31/2019 CLINICAL DATA:  Code stroke follow-up, left weakness and neglect EXAM: CT ANGIOGRAPHY HEAD AND NECK CT PERFUSION BRAIN TECHNIQUE: Multidetector CT imaging of the head and neck was performed using the standard protocol during bolus administration of intravenous contrast. Multiplanar CT image reconstructions and MIPs were obtained to evaluate the vascular anatomy. Carotid stenosis measurements (when applicable) are obtained utilizing NASCET criteria, using the distal internal carotid diameter as the denominator. Multiphase CT imaging of the brain was performed following IV bolus contrast injection. Subsequent parametric perfusion maps were calculated using RAPID software. CONTRAST:  192mL  OMNIPAQUE IOHEXOL 350 MG/ML SOLN COMPARISON:  None. FINDINGS: Suboptimal arterial contrast bolus timing. CTA NECK FINDINGS Aortic arch: Great vessel origins are patent. Right carotid system: Patent. No measurable stenosis or evidence of dissection. Left carotid system: Patent. No measurable stenosis or evidence of dissection. Vertebral arteries: Patent. Right vertebral artery slightly dominant. No measurable stenosis or evidence of dissection. Skeleton: No significant abnormality. Other neck: There is a 1.3 cm nodule of the left thyroid. Upper chest: Partially imaged bilateral dependent atelectasis/consolidation. Review of the MIP images confirms the above findings CTA HEAD FINDINGS Anterior circulation: Intracranial internal carotid arteries are patent. Anterior and middle cerebral arteries are patent. Posterior circulation: Intracranial right vertebral artery is patent. There is diminished visualization of the mid left intracranial vertebral artery. Basilar artery is patent.  Posterior cerebral arteries are patent. A right posterior communicating artery is present. Venous sinuses: As permitted by contrast timing, patent. Review of the MIP images confirms the above findings CT Brain Perfusion Findings: CBF (<30%) Volume: 0 mL, 5 mL right parietal lobe with less stringent threshold Perfusion (Tmax>6.0s) volume: 0 mL Mismatch Volume: 0 mL Infarction Location: None. IMPRESSION: No large vessel occlusion. Perfusion imaging demonstrates no evidence of core infarction or penumbra, although there may be some right parietal ischemia using less stringent threshold. Diminished visualization of mid left intracranial vertebral artery, which may be related to small caliber vessel. 1.3 cm left thyroid nodule. Recommend nonemergent thyroid US follow-up by current guidelines. (Ref: J Am Coll Radiol. 2015 Feb;12(2): 143-50). Electronically Signed   By: Macy Mis M.D.   On: 12/31/2019 16:11   CT Code Stroke CTA Neck W/WO contrast  Result Date: 12/31/2019 CLINICAL DATA:  Code stroke follow-up, left weakness and neglect EXAM: CT ANGIOGRAPHY HEAD AND NECK CT PERFUSION BRAIN TECHNIQUE: Multidetector CT imaging of the head and neck was performed using the standard protocol during bolus administration of intravenous contrast. Multiplanar CT image reconstructions and MIPs were obtained to evaluate the vascular anatomy. Carotid stenosis measurements (when applicable) are obtained utilizing NASCET criteria, using the distal internal carotid diameter as the denominator. Multiphase CT imaging of the brain was performed following IV bolus contrast injection. Subsequent parametric perfusion maps were calculated using RAPID software. CONTRAST:  169mL OMNIPAQUE IOHEXOL 350 MG/ML SOLN COMPARISON:  None. FINDINGS: Suboptimal arterial contrast bolus timing. CTA NECK FINDINGS Aortic arch: Great vessel origins are patent. Right carotid system: Patent. No measurable stenosis or evidence of dissection. Left carotid  system: Patent. No measurable stenosis or evidence of dissection. Vertebral arteries: Patent. Right vertebral artery slightly dominant. No measurable stenosis or evidence of dissection. Skeleton: No significant abnormality. Other neck: There is a 1.3 cm nodule of the left thyroid. Upper chest: Partially imaged bilateral dependent atelectasis/consolidation. Review of the MIP images confirms the above findings CTA HEAD FINDINGS Anterior circulation: Intracranial internal carotid arteries are patent. Anterior and middle cerebral arteries are patent. Posterior circulation: Intracranial right vertebral artery is patent. There is diminished visualization of the mid left intracranial vertebral artery. Basilar artery is patent. Posterior cerebral arteries are patent. A right posterior communicating artery is present. Venous sinuses: As permitted by contrast timing, patent. Review of the MIP images confirms the above findings CT Brain Perfusion Findings: CBF (<30%) Volume: 0 mL, 5 mL right parietal lobe with less stringent threshold Perfusion (Tmax>6.0s) volume: 0 mL Mismatch Volume: 0 mL Infarction Location: None. IMPRESSION: No large vessel occlusion. Perfusion imaging demonstrates no evidence of core infarction or penumbra, although there may be some right parietal ischemia using  less stringent threshold. Diminished visualization of mid left intracranial vertebral artery, which may be related to small caliber vessel. 1.3 cm left thyroid nodule. Recommend nonemergent thyroid US follow-up by current guidelines. (Ref: J Am Coll Radiol. 2015 Feb;12(2): 143-50). Electronically Signed   By: Macy Mis M.D.   On: 12/31/2019 16:11   MR BRAIN WO CONTRAST  Result Date: 12/31/2019 CLINICAL DATA:  Stroke, follow-up. EXAM: MRI HEAD WITHOUT CONTRAST TECHNIQUE: Multiplanar, multiecho pulse sequences of the brain and surrounding structures were obtained without intravenous contrast. COMPARISON:  Noncontrast head CT and CT  angiogram head/neck performed earlier the same day 12/31/2019. FINDINGS: Brain: Cerebral volume is normal. There are scattered supratentorial and infratentorial chronic microhemorrhages which are greater than expected for age and nonspecific. There is no acute infarct. No evidence of intracranial mass. No extra-axial fluid collection. No midline shift. Vascular: Expected proximal arterial flow voids. Skull and upper cervical spine: No focal marrow lesion. Sinuses/Orbits: Visualized orbits show no acute finding. Mild paranasal sinus mucosal thickening greatest within the ethmoid, right sphenoid and right maxillary sinuses. No significant mastoid effusion. IMPRESSION: No evidence of acute intracranial abnormality, including acute infarction. Scattered supratentorial and infratentorial chronic microhemorrhages which are greater than expected for age and nonspecific. Mild paranasal sinus mucosal thickening. Electronically Signed   By: Kellie Simmering DO   On: 12/31/2019 21:43   CT Code Stroke Cerebral Perfusion with contrast  Result Date: 12/31/2019 CLINICAL DATA:  Code stroke follow-up, left weakness and neglect EXAM: CT ANGIOGRAPHY HEAD AND NECK CT PERFUSION BRAIN TECHNIQUE: Multidetector CT imaging of the head and neck was performed using the standard protocol during bolus administration of intravenous contrast. Multiplanar CT image reconstructions and MIPs were obtained to evaluate the vascular anatomy. Carotid stenosis measurements (when applicable) are obtained utilizing NASCET criteria, using the distal internal carotid diameter as the denominator. Multiphase CT imaging of the brain was performed following IV bolus contrast injection. Subsequent parametric perfusion maps were calculated using RAPID software. CONTRAST:  123mL OMNIPAQUE IOHEXOL 350 MG/ML SOLN COMPARISON:  None. FINDINGS: Suboptimal arterial contrast bolus timing. CTA NECK FINDINGS Aortic arch: Great vessel origins are patent. Right carotid  system: Patent. No measurable stenosis or evidence of dissection. Left carotid system: Patent. No measurable stenosis or evidence of dissection. Vertebral arteries: Patent. Right vertebral artery slightly dominant. No measurable stenosis or evidence of dissection. Skeleton: No significant abnormality. Other neck: There is a 1.3 cm nodule of the left thyroid. Upper chest: Partially imaged bilateral dependent atelectasis/consolidation. Review of the MIP images confirms the above findings CTA HEAD FINDINGS Anterior circulation: Intracranial internal carotid arteries are patent. Anterior and middle cerebral arteries are patent. Posterior circulation: Intracranial right vertebral artery is patent. There is diminished visualization of the mid left intracranial vertebral artery. Basilar artery is patent. Posterior cerebral arteries are patent. A right posterior communicating artery is present. Venous sinuses: As permitted by contrast timing, patent. Review of the MIP images confirms the above findings CT Brain Perfusion Findings: CBF (<30%) Volume: 0 mL, 5 mL right parietal lobe with less stringent threshold Perfusion (Tmax>6.0s) volume: 0 mL Mismatch Volume: 0 mL Infarction Location: None. IMPRESSION: No large vessel occlusion. Perfusion imaging demonstrates no evidence of core infarction or penumbra, although there may be some right parietal ischemia using less stringent threshold. Diminished visualization of mid left intracranial vertebral artery, which may be related to small caliber vessel. 1.3 cm left thyroid nodule. Recommend nonemergent thyroid US follow-up by current guidelines. (Ref: J Am Coll Radiol. 2015 Feb;12(2): 143-50). Electronically  Signed   By: Macy Mis M.D.   On: 12/31/2019 16:11   DG Chest Port 1 View  Result Date: 01/01/2020 CLINICAL DATA:  Stroke EXAM: PORTABLE CHEST 1 VIEW COMPARISON:  Yesterday FINDINGS: Endotracheal tube with tip at the clavicular heads. The enteric tube at least  reaches the stomach. The lungs are clear. Normal heart size and mediastinal contours. IMPRESSION: 1. Unremarkable hardware. 2. Clear lungs. Electronically Signed   By: Monte Fantasia M.D.   On: 01/01/2020 07:58   DG Chest Port 1 View  Result Date: 12/31/2019 CLINICAL DATA:  Stroke, endotracheal tube EXAM: PORTABLE CHEST 1 VIEW COMPARISON:  2015 FINDINGS: Endotracheal tube is approximately 3 cm above the carina. Lungs are clear. No pleural effusion or pneumothorax. Normal heart size for technique. IMPRESSION: Endotracheal tube 3 cm above the carina. Electronically Signed   By: Macy Mis M.D.   On: 12/31/2019 15:33   DG Abd Portable 1V  Result Date: 12/31/2019 CLINICAL DATA:  OG tube placement EXAM: PORTABLE ABDOMEN - 1 VIEW COMPARISON:  CT abdomen pelvis 05/29/2013 FINDINGS: Please note this is not a full view of the chest or abdomen. Transesophageal tube tip terminates at the level of the gastric body within the left upper quadrant with side port distal to the GE junction. No high-grade obstructive bowel gas pattern is seen. Lung bases are clear. Additional support devices overlie the chest. Osseous structures are unremarkable. IMPRESSION: Transesophageal tube tip terminates at the level of the gastric body within the left upper quadrant with side port distal to the GE junction. Electronically Signed   By: Lovena Le M.D.   On: 12/31/2019 20:21   EEG adult  Result Date: 01/01/2020 Lora Havens, MD     01/01/2020  8:36 AM Patient Name: BERTRICE LEDER MRN: [id number] Epilepsy Attending: Lora Havens Referring Physician/Provider: Dr. Kathrynn Speed Date: 12/31/2019 Duration: 23.17 minutes Patient history: 29 year old female with history of IV drug use presented with left hemianopsia and neglect.  EEG to evaluate for seizure. Level of alertness: Comatose AEDs during EEG study: Propofol, Versed, phenobarbital Technical aspects: This EEG study was done with scalp electrodes positioned  according to the 10-20 International system of electrode placement. Electrical activity was acquired at a sampling rate of 500Hz  and reviewed with a high frequency filter of 70Hz  and a low frequency filter of 1Hz . EEG data were recorded continuously and digitally stored. Description: EEG showed burst suppression pattern with 5-6 seconds of EEG suppression alternating with 2 to 3seconds polymorphic 8-15 Hz alpha-beta activity. Hyperventilation and photic stimulation were not performed.   ABNORMALITY -Burst suppression, generalized IMPRESSION: This study is suggestive of profound diffuse encephalopathy, nonspecific etiology but likely related to sedation.  No seizures or epileptiform discharges were seen throughout the recording. Lora Havens   CT HEAD CODE STROKE WO CONTRAST  Result Date: 12/31/2019 CLINICAL DATA:  Code stroke. EXAM: CT HEAD WITHOUT CONTRAST TECHNIQUE: Contiguous axial images were obtained from the base of the skull through the vertex without intravenous contrast. COMPARISON:  None. FINDINGS: Brain: There is no acute intracranial hemorrhage, mass effect, or edema. Gray-white differentiation is preserved. Ventricles and sulci are normal in size and configuration. There is no extra-axial collection. Vascular: No hyperdense vessel. Skull: Unremarkable. Sinuses/Orbits: Minor mucosal thickening.  Orbits are unremarkable. Other: Mastoid air cells are clear. ASPECTS [facility] Stroke Program Early CT Score) - Ganglionic level infarction (caudate, lentiform nuclei, internal capsule, insula, M1-M3 cortex): 7 - Supraganglionic infarction (M4-M6 cortex): 3 Total score (0-10 with  10 being normal): 10 IMPRESSION: There is no acute intracranial hemorrhage or evidence of acute infarction. ASPECT score is 10. These results were communicated to Dr. Leonel Ramsay at 3:43 pm on 12/31/2019 by text page via the [facility] messaging system. Electronically Signed   By: Macy Mis M.D.   On: 12/31/2019 15:49     PHYSICAL EXAM Mildly obese young Caucasian lady who is intubated not in distress. . Afebrile. Head is nontraumatic. Neck is supple without bruit.    Cardiac exam no murmur or gallop. Lungs are clear to auscultation. Distal pulses are well felt. Neurological Exam : Patient is sedated and intubated.  Eyes are closed.  She responds to tactile and verbal stimuli by opening her eyes partially.  She follows midline commands and gaze in all directions.  She moves all 4 extremities to command against gravity.  Detailed strength testing is not possible.  Deep tendon reflexes are symmetric.  Plantars downgoing.  Gait not tested  ASSESSMENT/PLAN Ms. 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 In ED found to have L hemianopia, L negect who was wildly agitated requiring intubation.    Transient neuro deficit in setting of severe HA -TIA versus seizure or complicated migraine as MRI is negative for acute infarct  Code Stroke CT head No acute abnormality. ASPECTS 10.     CTA head & neck no LVO. 1.3cm L thyroid nodule (needs [place] f/u)  CT perfusion no core, no penumbra  MRI  No acute infarct. Scatter supra and infratentorial microhemorrhages. Sinus dz  2D Echo pending   LDL 111  HgbA1c pending    VTE prophylaxis - SCDs   No antithrombotic prior to admission, now on aspirin 300 mg suppository daily for now. 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 Patient advised to stop using due to stroke risk.  Obesity, Body mass index is 31.66 kg/m., recommend weight loss, diet and exercise as appropriate   Other Active Problems  Hypokalemia  L thyroid nodule (needs [place] f/u)  Hospital day # 1 Patient presented with slightly confusing picture with multifocal symptoms but was found to have documented focal signs in the form of gaze deviation and left-sided neglect and even though the CT angiogram and perfusion studies were negative for traditional cutoffs but there did appear to be an area of hypoperfusion in the right parietal region.  MRI shows no acute infarct but there is slight hyperintensity in the right parietal sulcus of unclear significance.  Recommend extubate patient as tolerated.  Repeat EEG and see previous phone had significant medication effect.  Long discussion with patient and mother at the bedside as well as Dr. Ander Slade critical care medicine. This patient  is critically ill and at significant risk of neurological worsening, death and care requires constant monitoring of vital signs, hemodynamics,respiratory and cardiac monitoring, extensive review of multiple databases, frequent neurological assessment, discussion with family, other specialists and medical decision making of high complexity.I have made any additions or clarifications directly to the above note.This critical care time does not reflect procedure time, or teaching time or supervisory time of PA/NP/Med Resident etc but could involve care discussion time.  I spent 30 minutes of neurocritical care time  in the care of  this patient.    Antony Contras, MD To contact Stroke Continuity provider, please refer to [email or web address] After  hours, contact General Neurology

## 2020-01-01 NOTE — Progress Notes (Signed)
PT Cancellation Note  Patient Details Name: Amber [name] MRN: [id number] DOB: 01/01/1991   Cancelled Treatment:    Reason Eval/Treat Not Completed: Patient not medically ready. RN trying to wean pt off sedation, if pt does well and doesn't become agitated the plan is to extubate patient. PT to return as able to complete PT eval once pt off sedation.  Kittie Plater, PT, DPT Acute Rehabilitation Services Pager #: [phone] Office #: [phone]    Berline Lopes 01/01/2020, 9:18 AM

## 2020-01-01 NOTE — Progress Notes (Signed)
eLink Physician-Brief Progress Note Patient Name: Amber [name] DOB: 27-Aug-1990 MRN: [id number]   Date of Service  01/01/2020  HPI/Events of Note  Agitation - Nursing request for Posey belt.  eICU Interventions  Will order Posey belt restraint X 13 hours.      Intervention Category Major Interventions: Delirium, psychosis, severe agitation - evaluation and management  , [place] 01/01/2020, 8:35 PM

## 2020-01-01 NOTE — Progress Notes (Signed)
*  PRELIMINARY RESULTS* Echocardiogram 2D Echocardiogram has been performed.  Amber [name] 01/01/2020, 8:48 AM

## 2020-01-02 ENCOUNTER — Encounter (HOSPITAL_COMMUNITY): Payer: Medicaid Other

## 2020-01-02 DIAGNOSIS — R519 Headache, unspecified: Secondary | ICD-10-CM

## 2020-01-02 LAB — BASIC METABOLIC PANEL
Anion gap: 10 (ref 5–15)
BUN: <5 mg/dL — ABNORMAL LOW (ref 6–20)
CO2: 23 mmol/L (ref 22–32)
Calcium: 8.8 mg/dL — ABNORMAL LOW (ref 8.9–10.3)
Chloride: 107 mmol/L (ref 98–111)
Creatinine, Ser: 0.64 mg/dL (ref 0.44–1.00)
GFR, Estimated: >60 mL/min (ref >60)
Glucose, Bld: 94 mg/dL (ref 70–99)
Potassium: 3 mmol/L — ABNORMAL LOW (ref 3.5–5.1)
Sodium: 140 mmol/L (ref 135–145)

## 2020-01-02 LAB — MAGNESIUM: Magnesium: 1.8 mg/dL (ref 1.7–2.4)

## 2020-01-02 LAB — URINE CULTURE: Culture: NO GROWTH

## 2020-01-02 LAB — HEMOGLOBIN A1C
Hgb A1c MFr Bld: 5.5 % (ref 4.8–5.6)
Mean Plasma Glucose: 111 mg/dL

## 2020-01-02 LAB — C-REACTIVE PROTEIN: CRP: 5.8 mg/dL — ABNORMAL HIGH (ref <1.0)

## 2020-01-02 LAB — SEDIMENTATION RATE: Sed Rate: 40 mm/hr — ABNORMAL HIGH (ref 0–22)

## 2020-01-02 LAB — PROCALCITONIN: Procalcitonin: <0.1 ng/mL

## 2020-01-02 MED ORDER — MAGNESIUM SULFATE 2 GM/50ML IV SOLN
2.0000 g | Freq: Once | INTRAVENOUS | Status: AC
  Administered 2020-01-02: 2 g via INTRAVENOUS
  Filled 2020-01-02: qty 50

## 2020-01-02 MED ORDER — POTASSIUM CHLORIDE CRYS ER 20 MEQ PO TBCR
20.0000 meq | EXTENDED_RELEASE_TABLET | ORAL | Status: DC
  Administered 2020-01-02: 20 meq via ORAL
  Filled 2020-01-02: qty 1

## 2020-01-02 MED ORDER — POTASSIUM CHLORIDE CRYS ER 20 MEQ PO TBCR
40.0000 meq | EXTENDED_RELEASE_TABLET | ORAL | Status: AC
  Administered 2020-01-02 (×2): 40 meq via ORAL
  Filled 2020-01-02 (×2): qty 2

## 2020-01-02 MED ORDER — POTASSIUM CHLORIDE CRYS ER 20 MEQ PO TBCR: 30.0000 meq | EXTENDED_RELEASE_TABLET | ORAL | Status: DC

## 2020-01-02 MED ORDER — POTASSIUM CHLORIDE 10 MEQ/100ML IV SOLN: 10.0000 meq | INTRAVENOUS | Status: DC

## 2020-01-02 MED ORDER — METHADONE HCL 10 MG PO TABS
80.0000 mg | ORAL_TABLET | Freq: Every day | ORAL | Status: DC
  Administered 2020-01-02 – 2020-01-07 (×6): 80 mg via ORAL
  Filled 2020-01-02 (×6): qty 8

## 2020-01-02 NOTE — Procedures (Addendum)
Patient Name: Amber [name]  MRN: [id number]  Epilepsy Attending: Lora Havens  Referring Physician/Provider: Burnetta Sabin Date: 01/01/2020 Duration: 22.32 mins  Patient history: 29 year old female with history of IV drug use presented with left hemianopsia and neglect. EEG to evaluate for seizure.  Level of alertness: awake  AEDs during EEG study: Lorazepam, topiramate  Technical aspects: This EEG study was done with scalp electrodes positioned according to the 10-20 International system of electrode placement. Electrical activity was acquired at a sampling rate of 500Hz  and reviewed with a high frequency filter of 70Hz  and a low frequency filter of 1Hz . EEG data were recorded continuously and digitally stored.   Description: EEG showed continuous rhythmic 2-3hz  delta slowing as well 6-9hz  theta-alpha activity in right hemisphere. Hyperventilation and photic stimulation were not performed.     ABNORMALITY - Continuous rhythmic delta slow,  Lateralized right hemisphere - Continuous slow, generalized  IMPRESSION: This study showed lateralized rhythmic delta activity in right hemisphere ( LRDA) which is on the ictal-interictal continuum with low potential for seizures and can also be seen secondary to underlying structural abnormality, post-ictal state.  Additionally, there is evidence of moderate diffuse encephalopathy, nonspecific etiology. No definite seizures or epileptiform discharges were seen throughout the recording.    Barbra Sarks

## 2020-01-02 NOTE — Progress Notes (Addendum)
SLP Cancellation Note  Patient Details Name: Amber [name] MRN: [id number] DOB: 1990/09/11   Cancelled treatment:       Reason Eval/Treat Not Completed: Fatigue/lethargy limiting ability to participate. Attempted cognitive linguistic eval. Pt tearful and restless. Could not sustain attention to assessment. Mother requested SLP come back another time.   Herbie Baltimore, MA CCC-SLP  Acute Rehabilitation Services Pager [phone] Office [phone]  Lynann Beaver 01/02/2020, 9:25 AM

## 2020-01-02 NOTE — Progress Notes (Signed)
Subjective: Patient has improved, though not back to baseline  Exam: Vitals:   01/02/20 0733 01/02/20 0800  BP: (!) 124/94 119/85  Pulse: 67 73  Resp: 12 12  Temp:  99.5 F (37.5 C)  SpO2: 98% 100%   Gen: In bed, NAD Resp: non-labored breathing, no acute distress Abd: soft, nt  Neuro: MS: Awake, alert, less evidence of neglect than on my previous exam 2 days ago, but she still has some mild neglect. CN: Pupils equal round and reactive, she crosses midline in both directions, visual fields are full Motor: She moves right side well, suspect mild motor neglect versus apraxia on the left Sensory: She endorses symmetric sensation and does not extinguish to double simultaneous stimulation today.  Pertinent Labs: Cr 0.64 PCT < 0.1 UDS + for opiates and benzos(both given in ED) Blood culture negative x 2 days  Impression: 29 year old female with focal right parietal deficits of unclear etiology.  Her initial CT perfusion scan did suggest hypoperfusion in this area which originally made me suspect that this was ischemia, but with persistent negative MRI as well as persistent symptoms, I think that stroke is very unlikely.  With headaches, I do think that CSF sampling is needed.  One other call of hypoperfusion is post ictal state, and if she is intermittently having seizures, this could explain her symptoms.  Autoimmune processes to be considered as well.  Recommendations: 1) LP for cells, glucose, protein, ACE, crypto antigen, fungal smear, further testing depending on initial results 2) overnight EEG 3) neurology will continue to follow  Roland Rack, MD [facility] [phone]  If 7pm- 7am, please page neurology on call as listed in [place].

## 2020-01-02 NOTE — Progress Notes (Signed)
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 In the ED was having severe right temporal headache 10 out of 10 multiple attempts to get CT were unsuccessful subsequently needing sedation, intubation and was managed in ICU seen by PCCM, neurology Blood culture urine cultures were ordered 10/26, patient was placed on empiric cefepime, vancomycin Forehead no evidence of acute abnormality chronic microhemorrhages, underwent EEG. Patient was extubated 10/27 Transferred to [facility] 10/28 Underwent repeat EEG 10/28  Subjective:  Overnight was placed in [place] This morning she appears calm alert awake oriented x3.  She is not in any restraint. Denies any complaints nausea vomiting headache focal weakness numbness tingling double vision. Nursing at the bedside, reports she could not tolerate MRI yesterday despite Ativan and does not want to go through it. Asking when she can go home.  Assessment & Plan:  Transient neuro deficit in the setting of severe headache TIA versus seizure or complicated migraine: Seen by neurology MRI brain negative for acute stroke, scattered supra and infratentorial microhemorrhages, underwent a stroke work-up with LDL 111, 2D echo-LVEF 60 to 27%, normal diastolic parameters normal right ventricular function-no other acute findings, hemoglobin A1c 5.5.  Undergoing extensive evaluation by neuro, had EEG overnight, see report as below. Seen the patient and advised LP for cells glucose protein ACE, crypto antigen and fungal smear given patient's headache.  And continue with current EEG.Repeat MRI brain with and without contrast ordered by neurology-but she is reluctant to go.   Acute agitation  unclear etiology concern for withdrawal but seems to be taking methadone.  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  Low-grade fever overnight 100.5: Culture blood culture negative since 10/26.and chest x-ray 10/27 was clear.  Was placed on vanco/cefepime on admission and currently on cefepime.  Hypokalemia being repleted aggressively in the setting of prolonged QTC  Acute respiratory failure needing intubation for agitation now extubated 10/27,   Thyroid nodule, left, will need ultrasound f/u  Nutrition: Diet Order            Diet regular Room service appropriate? Yes; Fluid consistency: Thin  Diet effective now                  Body mass index is 31.66 kg/m.  DVT prophylaxis: SCDs Start: 12/31/19 1522 Code Status:   Code Status: Full Code  Family Communication: plan of care discussed with patient at bedside.  Status is: Inpatient  Remains inpatient appropriate because:Ongoing diagnostic testing needed not appropriate for outpatient work up and Inpatient level of care appropriate due to severity of illness   Dispo: The patient is from: Home              Anticipated d/c is to: TBD              Anticipated d/c date is: 3 days              Patient currently is not medically stable to d/c.  Patient advised to stay here to  complete the work-up and not to leave [place].  She is alert awake oriented x3 verbalized understanding of the recommendations.  Consultants:see note  Procedures:see note  EEG 10/27 : On propofol Versed phenobarbital.  This study is suggestive of profound diffuse encephalopathy, nonspecific etiology but likely related to sedation.  No seizures or epileptiform discharges  were seen throughout the recording.  EEG 10/28:This studyshowed lateralized rhythmic delta activity in right hemisphere ( LRDA) which is on the ictal-interictal continuum with low potential for seizures and can also be seen secondary to underlying structural abnormality, post-ictal state.  Additionally, there is evidence of moderate diffuse encephalopathy, nonspecific etiology. No definite seizures or epileptiform discharges were seen throughout the recording   Culture/Microbiology    Component Value Date/Time   SDES URINE, RANDOM 12/31/2019 2028   [place] 12/31/2019 2028   CULT  12/31/2019 2028    NO GROWTH Performed at [facility] [address], [place], [place] [address]    REPTSTATUS 01/02/2020 FINAL 12/31/2019 2028    Other culture-see note  Medications: Scheduled Meds: . aspirin  325 mg Per Tube Daily   Or  . aspirin  300 mg Rectal Daily  . chlorhexidine gluconate (MEDLINE KIT)  15 mL Mouth Rinse BID  . Chlorhexidine Gluconate Cloth  6 each Topical Daily  . methadone  80 mg Oral Daily  . pantoprazole (PROTONIX) IV  40 mg Intravenous QHS  . potassium chloride  40 mEq Oral Q4H  . topiramate  50 mg Oral BID   Continuous Infusions: . sodium chloride Stopped (01/02/20 0347)  . ceFEPime (MAXIPIME) IV Stopped (01/02/20 0207)  . dexmedetomidine (PRECEDEX) IV infusion Stopped (01/01/20 1123)  . fentaNYL infusion INTRAVENOUS 100 mcg/hr (01/01/20 1000)    Antimicrobials: Anti-infectives (From admission, onward)   Start     Dose/Rate Route Frequency Ordered Stop   01/01/20 0200  ceFEPIme (MAXIPIME) 2 g in sodium chloride 0.9 % 100 mL IVPB        2 g 200 mL/hr over 30 Minutes Intravenous Every 8 hours 12/31/19 1646     12/31/19 1700  vancomycin (VANCOREADY) IVPB 2000 mg/400 mL        2,000 mg 200 mL/hr over 120 Minutes Intravenous  Once 12/31/19 1626 12/31/19 1921   12/31/19 1700  ceFEPIme (MAXIPIME) 2 g in sodium chloride 0.9 % 100 mL IVPB        2 g 200  mL/hr over 30 Minutes Intravenous STAT 12/31/19 1646 12/31/19 1758     Objective: Vitals: Today's Vitals   01/02/20 0000 01/02/20 0344 01/02/20 0400 01/02/20 0733  BP: 118/80 129/84  (!) 124/94  Pulse: 76 82  67  Resp:    12  Temp: 99.1 F (37.3 C)  98.6 F (37 C)   TempSrc: Oral  Oral   SpO2: 97% 100%  98%  Weight:      Height:      PainSc:  4       Intake/Output Summary (Last 24 hours) at 01/02/2020 0751 Last data filed at 01/02/2020 0400 Gross per 24 hour  Intake 993.97 ml  Output --  Net 993.97 ml   Filed Weights   12/31/19 1200 01/01/20 0426  Weight: 84.4 kg 86.3 kg   Weight change:   Intake/Output from previous day: 10/27 0701 - 10/28 0700 In: 994 [P.O.:480; I.V.:203.5; IV Piggyback:310.5] Out: -  Intake/Output this shift: No intake/output data recorded.  Examination: General exam: AAOx3 ,NAD, weak appearing.  Tearful. HEENT:Oral mucosa moist, Ear/Nose  WNL grossly,dentition normal. Respiratory system: bilaterally clear,no wheezing or crackles,no use of accessory muscle, non tender. Cardiovascular system: S1 & S2 +, regular, No JVD. Gastrointestinal system: Abdomen soft, NT,ND, BS+. Nervous System:Alert, awake, moving extremities and grossly nonfocal Extremities: No edema, distal peripheral pulses palpable.  Skin: No rashes,no icterus. MSK: Normal muscle bulk,tone, power  Data Reviewed: I have personally reviewed following labs and imaging studies CBC: Recent Labs  Lab 12/31/19 1300 12/31/19 1304 01/01/20 0124  WBC 7.9  --  8.1  NEUTROABS 5.6  --   --   HGB 13.6 13.6 12.5  HCT 40.2 40.0 37.5  MCV 96.6  --  96.6  PLT 263  --  201   Basic Metabolic Panel: Recent Labs  Lab 12/31/19 1300 12/31/19 1304 01/01/20 0124 01/02/20 0034  NA 137 140 139 140  K 3.3* 3.3* 3.4* 3.0*  CL 103 105 109 107  CO2 24  --  23 23  GLUCOSE 136* 134* 118* 94  BUN <5* 4* <5* <5*  CREATININE 0.71 0.60 0.62 0.64  CALCIUM 9.3  --  8.4* 8.8*  MG  --   --  1.8 1.8   PHOS  --   --  3.2  --    GFR: Estimated Creatinine Clearance: 110.5 mL/min (by C-G formula based on SCr of 0.64 mg/dL). Liver Function Tests: Recent Labs  Lab 12/31/19 1300  AST 19  ALT 24  ALKPHOS 50  BILITOT 0.6  PROT 7.8  ALBUMIN 4.3   No results for input(s): LIPASE, AMYLASE in the last 168 hours. No results for input(s): AMMONIA in the last 168 hours. Coagulation Profile: Recent Labs  Lab 12/31/19 1300  INR 1.0   Cardiac Enzymes: No results for input(s): CKTOTAL, CKMB, CKMBINDEX, TROPONINI in the last 168 hours. BNP (last 3 results) No results for input(s): PROBNP in the last 8760 hours. HbA1C: Recent Labs    01/01/20 0124  HGBA1C 5.5   CBG: Recent Labs  Lab 12/31/19 1258 12/31/19 2316  GLUCAP 137* 137*   Lipid Profile: Recent Labs    12/31/19 2000 01/01/20 0124  CHOL  --  166  HDL  --  33*  LDLCALC  --  111*  TRIG 91 110  CHOLHDL  --  5.0   Thyroid Function Tests: No results for input(s): TSH, T4TOTAL, FREET4, T3FREE, THYROIDAB in the last 72 hours. Anemia Panel: No results for input(s): VITAMINB12, FOLATE, FERRITIN, TIBC, IRON, RETICCTPCT in the last 72 hours. Sepsis Labs: Recent Labs  Lab 12/31/19 1615 12/31/19 2000 01/01/20 0124  PROCALCITON <0.10  --  <0.10  LATICACIDVEN 1.5 0.7  --     Recent Results (from the past 240 hour(s))  Respiratory Panel by RT PCR (Flu A&B, Covid) - Nasopharyngeal Swab     Status: None   Collection Time: 12/31/19  3:25 PM   Specimen: Nasopharyngeal Swab  Result Value Ref Range Status   SARS Coronavirus 2 by RT PCR NEGATIVE NEGATIVE Final    Comment: (NOTE) SARS-CoV-2 target nucleic acids are NOT DETECTED.  The SARS-CoV-2 RNA is generally detectable in upper respiratoy specimens during the acute phase of infection. The lowest concentration of SARS-CoV-2 viral copies this assay can detect is 131 copies/mL. A negative result does not preclude SARS-Cov-2 infection and should not be used as the sole basis  for treatment or other patient management decisions. A negative result may occur with  improper specimen collection/handling, submission of specimen other than nasopharyngeal swab, presence of viral mutation(s) within the areas targeted  by this assay, and inadequate number of viral copies (<131 copies/mL). A negative result must be combined with clinical observations, patient history, and epidemiological information. The expected result is Negative.  Fact Sheet for Patients:  PinkCheek.be  Fact Sheet for Healthcare Providers:  GravelBags.it  This test is no t yet approved or cleared by the [place] FDA and  has been authorized for detection and/or diagnosis of SARS-CoV-2 by FDA under an Emergency Use Authorization (EUA). This EUA will remain  in effect (meaning this test can be used) for the duration of the COVID-19 declaration under Section 564(b)(1) of the Act, 21 U.S.C. section 360bbb-3(b)(1), unless the authorization is terminated or revoked sooner.     Influenza A by PCR NEGATIVE NEGATIVE Final   Influenza B by PCR NEGATIVE NEGATIVE Final    Comment: (NOTE) The Xpert Xpress SARS-CoV-2/FLU/RSV assay is intended as an aid in  the diagnosis of influenza from Nasopharyngeal swab specimens and  should not be used as a sole basis for treatment. Nasal washings and  aspirates are unacceptable for Xpert Xpress SARS-CoV-2/FLU/RSV  testing.  Fact Sheet for Patients: PinkCheek.be  Fact Sheet for Healthcare Providers: GravelBags.it  This test is not yet approved or cleared by the [place] FDA and  has been authorized for detection and/or diagnosis of SARS-CoV-2 by  FDA under an Emergency Use Authorization (EUA). This EUA will remain  in effect (meaning this test can be used) for the duration of the  Covid-19 declaration under Section 564(b)(1) of the Act, 21    U.S.C. section 360bbb-3(b)(1), unless the authorization is  terminated or revoked. Performed at [facility], [place] [address], [place], [place] [address]   Culture, blood (routine x 2)     Status: None (Preliminary result)   Collection Time: 12/31/19  4:15 PM   Specimen: BLOOD  Result Value Ref Range Status   Specimen Description BLOOD SITE NOT SPECIFIED  Final   Special Requests   Final    BOTTLES DRAWN AEROBIC AND ANAEROBIC Blood Culture adequate volume   Culture   Final    NO GROWTH 2 DAYS Performed at [facility], [place] [address] [place], [place] [address]    Report Status PENDING  Incomplete  Culture, blood (routine x 2)     Status: None (Preliminary result)   Collection Time: 12/31/19  4:36 PM   Specimen: BLOOD  Result Value Ref Range Status   Specimen Description BLOOD SITE NOT SPECIFIED  Final   Special Requests   Final    BOTTLES DRAWN AEROBIC ONLY Blood Culture results may not be optimal due to an inadequate volume of blood received in culture bottles   Culture   Final    NO GROWTH 2 DAYS Performed at [facility], [place] [address] [place], [place] [address]    Report Status PENDING  Incomplete  MRSA PCR Screening     Status: None   Collection Time: 12/31/19  7:13 PM   Specimen: Nasal Mucosa; Nasopharyngeal  Result Value Ref Range Status   MRSA by PCR NEGATIVE NEGATIVE Final    Comment:        The GeneXpert MRSA Assay (FDA approved for NASAL specimens only), is one component of a comprehensive MRSA colonization surveillance program. It is not intended to diagnose MRSA infection nor to guide or monitor treatment for MRSA infections. Performed at [facility], [place] [address], [place], [place] [address]   Urine culture  Status: None   Collection Time: 12/31/19  8:28 PM   Specimen: Urine, Random  Result Value Ref Range Status   Specimen Description URINE, RANDOM  Final   Special Requests NONE  Final   Culture   Final    NO  GROWTH Performed at [facility], [place] [address] [place], [place] [address]    Report Status 01/02/2020 FINAL  Final     Radiology Studies: CT Code Stroke CTA Head W/WO contrast  Result Date: 12/31/2019 CLINICAL DATA:  Code stroke follow-up, left weakness and neglect EXAM: CT ANGIOGRAPHY HEAD AND NECK CT PERFUSION BRAIN TECHNIQUE: Multidetector CT imaging of the head and neck was performed using the standard protocol during bolus administration of intravenous contrast. Multiplanar CT image reconstructions and MIPs were obtained to evaluate the vascular anatomy. Carotid stenosis measurements (when applicable) are obtained utilizing NASCET criteria, using the distal internal carotid diameter as the denominator. Multiphase CT imaging of the brain was performed following IV bolus contrast injection. Subsequent parametric perfusion maps were calculated using RAPID software. CONTRAST:  185mL OMNIPAQUE IOHEXOL 350 MG/ML SOLN COMPARISON:  None. FINDINGS: Suboptimal arterial contrast bolus timing. CTA NECK FINDINGS Aortic arch: Great vessel origins are patent. Right carotid system: Patent. No measurable stenosis or evidence of dissection. Left carotid system: Patent. No measurable stenosis or evidence of dissection. Vertebral arteries: Patent. Right vertebral artery slightly dominant. No measurable stenosis or evidence of dissection. Skeleton: No significant abnormality. Other neck: There is a 1.3 cm nodule of the left thyroid. Upper chest: Partially imaged bilateral dependent atelectasis/consolidation. Review of the MIP images confirms the above findings CTA HEAD FINDINGS Anterior circulation: Intracranial internal carotid arteries are patent. Anterior and middle cerebral arteries are patent. Posterior circulation: Intracranial right vertebral artery is patent. There is diminished visualization of the mid left intracranial vertebral artery. Basilar artery is patent. Posterior cerebral arteries are patent. A  right posterior communicating artery is present. Venous sinuses: As permitted by contrast timing, patent. Review of the MIP images confirms the above findings CT Brain Perfusion Findings: CBF (<30%) Volume: 0 mL, 5 mL right parietal lobe with less stringent threshold Perfusion (Tmax>6.0s) volume: 0 mL Mismatch Volume: 0 mL Infarction Location: None. IMPRESSION: No large vessel occlusion. Perfusion imaging demonstrates no evidence of core infarction or penumbra, although there may be some right parietal ischemia using less stringent threshold. Diminished visualization of mid left intracranial vertebral artery, which may be related to small caliber vessel. 1.3 cm left thyroid nodule. Recommend nonemergent thyroid US follow-up by current guidelines. (Ref: J Am Coll Radiol. 2015 Feb;12(2): 143-50). Electronically Signed   By: Macy Mis M.D.   On: 12/31/2019 16:11   CT Code Stroke CTA Neck W/WO contrast  Result Date: 12/31/2019 CLINICAL DATA:  Code stroke follow-up, left weakness and neglect EXAM: CT ANGIOGRAPHY HEAD AND NECK CT PERFUSION BRAIN TECHNIQUE: Multidetector CT imaging of the head and neck was performed using the standard protocol during bolus administration of intravenous contrast. Multiplanar CT image reconstructions and MIPs were obtained to evaluate the vascular anatomy. Carotid stenosis measurements (when applicable) are obtained utilizing NASCET criteria, using the distal internal carotid diameter as the denominator. Multiphase CT imaging of the brain was performed following IV bolus contrast injection. Subsequent parametric perfusion maps were calculated using RAPID software. CONTRAST:  119mL OMNIPAQUE IOHEXOL 350 MG/ML SOLN COMPARISON:  None. FINDINGS: Suboptimal arterial contrast bolus timing. CTA NECK FINDINGS Aortic arch: Great vessel origins are patent. Right carotid system: Patent. No measurable stenosis or evidence of  dissection. Left carotid system: Patent. No measurable stenosis or  evidence of dissection. Vertebral arteries: Patent. Right vertebral artery slightly dominant. No measurable stenosis or evidence of dissection. Skeleton: No significant abnormality. Other neck: There is a 1.3 cm nodule of the left thyroid. Upper chest: Partially imaged bilateral dependent atelectasis/consolidation. Review of the MIP images confirms the above findings CTA HEAD FINDINGS Anterior circulation: Intracranial internal carotid arteries are patent. Anterior and middle cerebral arteries are patent. Posterior circulation: Intracranial right vertebral artery is patent. There is diminished visualization of the mid left intracranial vertebral artery. Basilar artery is patent. Posterior cerebral arteries are patent. A right posterior communicating artery is present. Venous sinuses: As permitted by contrast timing, patent. Review of the MIP images confirms the above findings CT Brain Perfusion Findings: CBF (<30%) Volume: 0 mL, 5 mL right parietal lobe with less stringent threshold Perfusion (Tmax>6.0s) volume: 0 mL Mismatch Volume: 0 mL Infarction Location: None. IMPRESSION: No large vessel occlusion. Perfusion imaging demonstrates no evidence of core infarction or penumbra, although there may be some right parietal ischemia using less stringent threshold. Diminished visualization of mid left intracranial vertebral artery, which may be related to small caliber vessel. 1.3 cm left thyroid nodule. Recommend nonemergent thyroid US follow-up by current guidelines. (Ref: J Am Coll Radiol. 2015 Feb;12(2): 143-50). Electronically Signed   By: Guadlupe Spanish M.D.   On: 12/31/2019 16:11   MR BRAIN WO CONTRAST  Result Date: 12/31/2019 CLINICAL DATA:  Stroke, follow-up. EXAM: MRI HEAD WITHOUT CONTRAST TECHNIQUE: Multiplanar, multiecho pulse sequences of the brain and surrounding structures were obtained without intravenous contrast. COMPARISON:  Noncontrast head CT and CT angiogram head/neck performed earlier the same  day 12/31/2019. FINDINGS: Brain: Cerebral volume is normal. There are scattered supratentorial and infratentorial chronic microhemorrhages which are greater than expected for age and nonspecific. There is no acute infarct. No evidence of intracranial mass. No extra-axial fluid collection. No midline shift. Vascular: Expected proximal arterial flow voids. Skull and upper cervical spine: No focal marrow lesion. Sinuses/Orbits: Visualized orbits show no acute finding. Mild paranasal sinus mucosal thickening greatest within the ethmoid, right sphenoid and right maxillary sinuses. No significant mastoid effusion. IMPRESSION: No evidence of acute intracranial abnormality, including acute infarction. Scattered supratentorial and infratentorial chronic microhemorrhages which are greater than expected for age and nonspecific. Mild paranasal sinus mucosal thickening. Electronically Signed   By: Jackey Loge DO   On: 12/31/2019 21:43   CT Code Stroke Cerebral Perfusion with contrast  Result Date: 12/31/2019 CLINICAL DATA:  Code stroke follow-up, left weakness and neglect EXAM: CT ANGIOGRAPHY HEAD AND NECK CT PERFUSION BRAIN TECHNIQUE: Multidetector CT imaging of the head and neck was performed using the standard protocol during bolus administration of intravenous contrast. Multiplanar CT image reconstructions and MIPs were obtained to evaluate the vascular anatomy. Carotid stenosis measurements (when applicable) are obtained utilizing NASCET criteria, using the distal internal carotid diameter as the denominator. Multiphase CT imaging of the brain was performed following IV bolus contrast injection. Subsequent parametric perfusion maps were calculated using RAPID software. CONTRAST:  OMNIPAQUE IOHEXOL 350 MG/ML SOLN COMPARISON:  None. FINDINGS: Suboptimal arterial contrast bolus timing. CTA NECK FINDINGS Aortic arch: Great vessel origins are patent. Right carotid system: Patent. No measurable stenosis or evidence of  dissection. Left carotid system: Patent. No measurable stenosis or evidence of dissection. Vertebral arteries: Patent. Right vertebral artery slightly dominant. No measurable stenosis or evidence of dissection. Skeleton: No significant abnormality. Other neck: There is a 1.3 cm nodule of  the left thyroid. Upper chest: Partially imaged bilateral dependent atelectasis/consolidation. Review of the MIP images confirms the above findings CTA HEAD FINDINGS Anterior circulation: Intracranial internal carotid arteries are patent. Anterior and middle cerebral arteries are patent. Posterior circulation: Intracranial right vertebral artery is patent. There is diminished visualization of the mid left intracranial vertebral artery. Basilar artery is patent. Posterior cerebral arteries are patent. A right posterior communicating artery is present. Venous sinuses: As permitted by contrast timing, patent. Review of the MIP images confirms the above findings CT Brain Perfusion Findings: CBF (<30%) Volume: 0 mL, 5 mL right parietal lobe with less stringent threshold Perfusion (Tmax>6.0s) volume: 0 mL Mismatch Volume: 0 mL Infarction Location: None. IMPRESSION: No large vessel occlusion. Perfusion imaging demonstrates no evidence of core infarction or penumbra, although there may be some right parietal ischemia using less stringent threshold. Diminished visualization of mid left intracranial vertebral artery, which may be related to small caliber vessel. 1.3 cm left thyroid nodule. Recommend nonemergent thyroid US follow-up by current guidelines. (Ref: J Am Coll Radiol. 2015 Feb;12(2): 143-50). Electronically Signed   By: Macy Mis M.D.   On: 12/31/2019 16:11   DG Chest Port 1 View  Result Date: 01/01/2020 CLINICAL DATA:  Stroke EXAM: PORTABLE CHEST 1 VIEW COMPARISON:  Yesterday FINDINGS: Endotracheal tube with tip at the clavicular heads. The enteric tube at least reaches the stomach. The lungs are clear. Normal heart size  and mediastinal contours. IMPRESSION: 1. Unremarkable hardware. 2. Clear lungs. Electronically Signed   By: Monte Fantasia M.D.   On: 01/01/2020 07:58   DG Chest Port 1 View  Result Date: 12/31/2019 CLINICAL DATA:  Stroke, endotracheal tube EXAM: PORTABLE CHEST 1 VIEW COMPARISON:  2015 FINDINGS: Endotracheal tube is approximately 3 cm above the carina. Lungs are clear. No pleural effusion or pneumothorax. Normal heart size for technique. IMPRESSION: Endotracheal tube 3 cm above the carina. Electronically Signed   By: Macy Mis M.D.   On: 12/31/2019 15:33   DG Abd Portable 1V  Result Date: 12/31/2019 CLINICAL DATA:  OG tube placement EXAM: PORTABLE ABDOMEN - 1 VIEW COMPARISON:  CT abdomen pelvis 05/29/2013 FINDINGS: Please note this is not a full view of the chest or abdomen. Transesophageal tube tip terminates at the level of the gastric body within the left upper quadrant with side port distal to the GE junction. No high-grade obstructive bowel gas pattern is seen. Lung bases are clear. Additional support devices overlie the chest. Osseous structures are unremarkable. IMPRESSION: Transesophageal tube tip terminates at the level of the gastric body within the left upper quadrant with side port distal to the GE junction. Electronically Signed   By: Lovena Le M.D.   On: 12/31/2019 20:21   EEG adult  Result Date: 01/01/2020 Lora Havens, MD     01/01/2020  8:36 AM Patient Name: Amber [name] MRN: [id number] Epilepsy Attending: Lora Havens Referring Physician/Provider: Dr. Kathrynn Speed Date: 12/31/2019 Duration: 23.17 minutes Patient history: 29 year old female with history of IV drug use presented with left hemianopsia and neglect.  EEG to evaluate for seizure. Level of alertness: Comatose AEDs during EEG study: Propofol, Versed, phenobarbital Technical aspects: This EEG study was done with scalp electrodes positioned according to the 10-20 International system of electrode  placement. Electrical activity was acquired at a sampling rate of $Remov'500Hz'nwxrUx$  and reviewed with a high frequency filter of $RemoveB'70Hz'PBsmtgop$  and a low frequency filter of $RemoveB'1Hz'aGZOWNLA$ . EEG data were recorded continuously and digitally stored. Description: EEG showed burst  suppression pattern with 5-6 seconds of EEG suppression alternating with 2 to 3seconds polymorphic 8-15 Hz alpha-beta activity. Hyperventilation and photic stimulation were not performed.   ABNORMALITY -Burst suppression, generalized IMPRESSION: This study is suggestive of profound diffuse encephalopathy, nonspecific etiology but likely related to sedation.  No seizures or epileptiform discharges were seen throughout the recording. Charlsie Quest   ECHOCARDIOGRAM COMPLETE  Result Date: 01/01/2020    ECHOCARDIOGRAM REPORT   Patient Name:   PROSPERITY DARROUGH Date of Exam: 01/01/2020 Medical Rec #:  [id number]     Height:       65.0 in Accession #:    [id number]    Weight:       190.3 lb Date of Birth:  01-27-1991     BSA:          1.937 m Patient Age:    29 years      BP:           91/58 mmHg Patient Gender: F             HR:           58 bpm. Exam Location:  Inpatient Procedure: 2D Echo, Cardiac Doppler and Color Doppler Indications:    CVA  History:        Patient has no prior history of Echocardiogram examinations.                 Stroke, Signs/Symptoms:Fever; Risk Factors:Current Smoker. IV                 drug abuse, headache, Resp. failure, sepsis.  Sonographer:    Lavenia Atlas Referring Phys: [id number] Vinnie Level SMITH IMPRESSIONS  1. Left ventricular ejection fraction, by estimation, is 60 to 65%. The left ventricle has normal function. The left ventricle has no regional wall motion abnormalities. Left ventricular diastolic parameters were normal.  2. Right ventricular systolic function is normal. The right ventricular size is normal. Tricuspid regurgitation signal is inadequate for assessing PA pressure.  3. The mitral valve is normal in structure. Trivial mitral  valve regurgitation. No evidence of mitral stenosis.  4. The aortic valve is tricuspid. Aortic valve regurgitation is not visualized. No aortic stenosis is present. Comparison(s): No prior Echocardiogram. Conclusion(s)/Recommendation(s): Normal biventricular function without evidence of hemodynamically significant valvular heart disease. No intracardiac source of embolism detected on this transthoracic study. A transesophageal echocardiogram is recommended to exclude cardiac source of embolism if clinically indicated. FINDINGS  Left Ventricle: Left ventricular ejection fraction, by estimation, is 60 to 65%. The left ventricle has normal function. The left ventricle has no regional wall motion abnormalities. The left ventricular internal cavity size was normal in size. There is  no left ventricular hypertrophy. Left ventricular diastolic parameters were normal. Right Ventricle: The right ventricular size is normal. No increase in right ventricular wall thickness. Right ventricular systolic function is normal. Tricuspid regurgitation signal is inadequate for assessing PA pressure. Left Atrium: Left atrial size was normal in size. Right Atrium: Right atrial size was normal in size. Pericardium: Trivial pericardial effusion is present. Mitral Valve: The mitral valve is normal in structure. Trivial mitral valve regurgitation. No evidence of mitral valve stenosis. Tricuspid Valve: The tricuspid valve is normal in structure. Tricuspid valve regurgitation is trivial. No evidence of tricuspid stenosis. Aortic Valve: The aortic valve is tricuspid. Aortic valve regurgitation is not visualized. No aortic stenosis is present. Pulmonic Valve: The pulmonic valve was not well visualized. Pulmonic valve regurgitation is not visualized. No  evidence of pulmonic stenosis. Aorta: The aortic root, ascending aorta and aortic arch are all structurally normal, with no evidence of dilitation or obstruction. Venous: IVC assessment for right  atrial pressure unable to be performed due to mechanical ventilation. IAS/Shunts: No atrial level shunt detected by color flow Doppler.  LEFT VENTRICLE PLAX 2D LVIDd:         4.70 cm  Diastology LVIDs:         3.00 cm  LV e' medial:    8.81 cm/s LV PW:         0.90 cm  LV E/e' medial:  10.6 LV IVS:        1.10 cm  LV e' lateral:   10.80 cm/s LVOT diam:     2.00 cm  LV E/e' lateral: 8.6 LV SV:         75 LV SV Index:   39 LVOT Area:     3.14 cm  RIGHT VENTRICLE RV Basal diam:  2.70 cm RV S prime:     10.10 cm/s TAPSE (M-mode): 2.8 cm LEFT ATRIUM             Index       RIGHT ATRIUM           Index LA diam:        3.40 cm 1.76 cm/m  RA Area:     14.00 cm LA Vol (A2C):   36.3 ml 18.74 ml/m RA Volume:   33.80 ml  17.45 ml/m LA Vol (A4C):   46.4 ml 23.96 ml/m LA Biplane Vol: 43.3 ml 22.36 ml/m  AORTIC VALVE LVOT Vmax:   106.00 cm/s LVOT Vmean:  69.400 cm/s LVOT VTI:    0.238 m  AORTA Ao Root diam: 2.90 cm MITRAL VALVE MV Area (PHT): 3.03 cm    SHUNTS MV Decel Time: 250 msec    Systemic VTI:  0.24 m MV E velocity: 93.40 cm/s  Systemic Diam: 2.00 cm MV A velocity: 54.00 cm/s MV E/A ratio:  1.73 Buford Dresser MD Electronically signed by Buford Dresser MD Signature Date/Time: 01/01/2020/12:24:20 PM    Final    CT HEAD CODE STROKE WO CONTRAST  Result Date: 12/31/2019 CLINICAL DATA:  Code stroke. EXAM: CT HEAD WITHOUT CONTRAST TECHNIQUE: Contiguous axial images were obtained from the base of the skull through the vertex without intravenous contrast. COMPARISON:  None. FINDINGS: Brain: There is no acute intracranial hemorrhage, mass effect, or edema. Gray-white differentiation is preserved. Ventricles and sulci are normal in size and configuration. There is no extra-axial collection. Vascular: No hyperdense vessel. Skull: Unremarkable. Sinuses/Orbits: Minor mucosal thickening.  Orbits are unremarkable. Other: Mastoid air cells are clear. ASPECTS ([place] Stroke Program Early CT Score) - Ganglionic  level infarction (caudate, lentiform nuclei, internal capsule, insula, M1-M3 cortex): 7 - Supraganglionic infarction (M4-M6 cortex): 3 Total score (0-10 with 10 being normal): 10 IMPRESSION: There is no acute intracranial hemorrhage or evidence of acute infarction. ASPECT score is 10. These results were communicated to Dr. Leonel Ramsay at 3:43 pm on 12/31/2019 by text page via the [facility] messaging system. Electronically Signed   By: Macy Mis M.D.   On: 12/31/2019 15:49     LOS: 2 days   Antonieta Pert, MD [facility]  01/02/2020, 7:51 AM

## 2020-01-02 NOTE — Progress Notes (Signed)
Tech attempted EEG hookup. Pt refused EEG. Pt told nurse as well she didn't want the EEG. Notified Dr.Kirkpatrick

## 2020-01-02 NOTE — Progress Notes (Addendum)
K+ 3.0, Mg 1.8 Replaced per protocol

## 2020-01-02 NOTE — Procedures (Signed)
Indication: Right parietal deficits of unclear etiology  Risks of the procedure were dicussed with the patient including post-LP headache, bleeding, infection, weakness/numbness of legs(radiculopathy), death.  The patient's proxy agreed and written consent was obtained.   The patient was prepped and draped, and using sterile technique a 20 gauge quinke spinal needle was inserted in the L3-4 space.  Patient was unable to stay still thus no fluid was able to be obtained.  3 attempts were made before patient tried to sit up and room in the sterile field.  Patient will need to be done under sedation by IR.  Etta Quill PA-C [facility] [phone]  M-F  (9:00 am- 5:00 PM)  01/02/2020, 1:15 PM

## 2020-01-03 ENCOUNTER — Inpatient Hospital Stay (HOSPITAL_COMMUNITY): Payer: Medicaid Other

## 2020-01-03 HISTORY — PX: IR FLUORO GUIDED NEEDLE PLC ASPIRATION/INJECTION LOC: IMG2395

## 2020-01-03 LAB — COMPREHENSIVE METABOLIC PANEL
ALT: 21 U/L (ref 0–44)
AST: 33 U/L (ref 15–41)
Albumin: 4 g/dL (ref 3.5–5.0)
Alkaline Phosphatase: 44 U/L (ref 38–126)
Anion gap: 9 (ref 5–15)
BUN: 6 mg/dL (ref 6–20)
CO2: 21 mmol/L — ABNORMAL LOW (ref 22–32)
Calcium: 9.7 mg/dL (ref 8.9–10.3)
Chloride: 107 mmol/L (ref 98–111)
Creatinine, Ser: 0.74 mg/dL (ref 0.44–1.00)
GFR, Estimated: >60 mL/min (ref >60)
Glucose, Bld: 105 mg/dL — ABNORMAL HIGH (ref 70–99)
Potassium: 4 mmol/L (ref 3.5–5.1)
Sodium: 137 mmol/L (ref 135–145)
Total Bilirubin: 0.9 mg/dL (ref 0.3–1.2)
Total Protein: 7.8 g/dL (ref 6.5–8.1)

## 2020-01-03 LAB — CBC WITH DIFFERENTIAL/PLATELET
Abs Immature Granulocytes: 0.02 10*3/uL (ref 0.00–0.07)
Basophils Absolute: 0 10*3/uL (ref 0.0–0.1)
Basophils Relative: 1 %
Eosinophils Absolute: 0.1 10*3/uL (ref 0.0–0.5)
Eosinophils Relative: 2 %
HCT: 38 % (ref 36.0–46.0)
Hemoglobin: 13.1 g/dL (ref 12.0–15.0)
Immature Granulocytes: 0 %
Lymphocytes Relative: 37 %
Lymphs Abs: 2.2 10*3/uL (ref 0.7–4.0)
MCH: 32.7 pg (ref 26.0–34.0)
MCHC: 34.5 g/dL (ref 30.0–36.0)
MCV: 94.8 fL (ref 80.0–100.0)
Monocytes Absolute: 0.4 10*3/uL (ref 0.1–1.0)
Monocytes Relative: 7 %
Neutro Abs: 3.2 10*3/uL (ref 1.7–7.7)
Neutrophils Relative %: 53 %
Platelets: 261 10*3/uL (ref 150–400)
RBC: 4.01 MIL/uL (ref 3.87–5.11)
RDW: 12.3 % (ref 11.5–15.5)
WBC: 6 10*3/uL (ref 4.0–10.5)
nRBC: 0 % (ref 0.0–0.2)

## 2020-01-03 LAB — CRYPTOCOCCAL ANTIGEN, CSF: Crypto Ag: NEGATIVE

## 2020-01-03 LAB — CSF CELL COUNT WITH DIFFERENTIAL
Eosinophils, CSF: 0 % (ref 0–1)
Lymphs, CSF: 97 % — ABNORMAL HIGH (ref 40–80)
Monocyte-Macrophage-Spinal Fluid: 3 % — ABNORMAL LOW (ref 15–45)
RBC Count, CSF: 73 /mm3 — ABNORMAL HIGH
Segmented Neutrophils-CSF: 0 % (ref 0–6)
Tube #: 1
WBC, CSF: 14 /mm3 (ref 0–5)

## 2020-01-03 LAB — ANTIEXTRACTABLE NUCLEAR AG
ENA SM Ab Ser-aCnc: <0.2 AI (ref 0.0–0.9)
Ribonucleic Protein: 0.3 AI (ref 0.0–0.9)

## 2020-01-03 LAB — TRIGLYCERIDES: Triglycerides: 109 mg/dL (ref <150)

## 2020-01-03 LAB — PROTEIN AND GLUCOSE, CSF
Glucose, CSF: 66 mg/dL (ref 40–70)
Total  Protein, CSF: 60 mg/dL — ABNORMAL HIGH (ref 15–45)

## 2020-01-03 LAB — MAGNESIUM: Magnesium: 2 mg/dL (ref 1.7–2.4)

## 2020-01-03 LAB — ANA W/REFLEX IF POSITIVE: Anti Nuclear Antibody (ANA): NEGATIVE

## 2020-01-03 MED ORDER — DEXTROSE 5 % IV SOLN
670.0000 mg | Freq: Three times a day (TID) | INTRAVENOUS | Status: DC
  Administered 2020-01-03 – 2020-01-05 (×5): 670 mg via INTRAVENOUS
  Filled 2020-01-03 (×7): qty 13.4

## 2020-01-03 MED ORDER — FENTANYL CITRATE (PF) 100 MCG/2ML IJ SOLN
INTRAMUSCULAR | Status: AC
  Filled 2020-01-03: qty 2

## 2020-01-03 MED ORDER — FENTANYL CITRATE (PF) 100 MCG/2ML IJ SOLN
INTRAMUSCULAR | Status: AC | PRN
Start: 2020-01-03 — End: 2020-01-03
  Administered 2020-01-03 (×2): 25 ug via INTRAVENOUS

## 2020-01-03 MED ORDER — LIDOCAINE HCL 1 % IJ SOLN
INTRAMUSCULAR | Status: AC
  Filled 2020-01-03: qty 20

## 2020-01-03 MED ORDER — MIDAZOLAM HCL 2 MG/2ML IJ SOLN
INTRAMUSCULAR | Status: AC
  Filled 2020-01-03: qty 2

## 2020-01-03 MED ORDER — MIDAZOLAM HCL 2 MG/2ML IJ SOLN
INTRAMUSCULAR | Status: AC | PRN
  Administered 2020-01-03: 0.5 mg via INTRAVENOUS

## 2020-01-03 NOTE — Progress Notes (Signed)
Occupational Therapy Treatment Patient Details Name: Amber [name] MRN: [id number] DOB: [birth year] Today's Date: 01/03/2020    History of present illness Pt is is a 29 y/o female with a PMHx of IVDU who presents to the ED with c/o left-sided neglect, weakness concerning for embolic stroke. MRI with no acute findings. Revealed scattered supratentorial and infratentorial chronic microhemorrhages. EEG completed 10/27 and suggestive of profound diffuse encephalopathy, nonspecific etiology but likely related to sedation.    OT comments  Pt progressing towards acute OT goals. LUE deficits appear to have resolved. Pt's mobility has improved. Some residual cognitive deficits remain (see cognition section below). Also noted decreased speed of speech and needing a little extra time to get her words out at times. D/c plan remains appropriate.    Follow Up Recommendations  Outpatient OT;Supervision - Intermittent    Equipment Recommendations  None recommended by OT    Recommendations for Other Services  Speech Consult    Precautions / Restrictions Precautions Precautions: Fall Restrictions Weight Bearing Restrictions: No       Mobility Bed Mobility Overal bed mobility: Independent             General bed mobility comments: up in recliner  Transfers Overall transfer level: Independent Equipment used: None Transfers: Sit to/from Stand Sit to Stand: Independent              Balance Overall balance assessment: Independent                             High Level Balance Comments: Pt able to perform tandem standing and rhomberg without difficulty           ADL either performed or assessed with clinical judgement   ADL Overall ADL's : Needs assistance/impaired                                       General ADL Comments: Pt much improved from previous OT session. Some mild residual cognitive deficits still noted. Decreased insight into  deficits.     Vision   Additional Comments: mild L side neglect noted   Perception     Praxis      Cognition Arousal/Alertness: Awake/alert Behavior During Therapy: WFL for tasks assessed/performed Overall Cognitive Status: Impaired/Different from baseline Area of Impairment: Safety/judgement;Attention;Awareness                   Current Attention Level: Selective     Safety/Judgement: Decreased awareness of safety;Decreased awareness of deficits     General Comments: tangential responses. emergent to anticipatory awareness.        Exercises     Shoulder Instructions       General Comments      Pertinent Vitals/ Pain       Pain Assessment: Faces Pain Score: 10-Worst pain ever Faces Pain Scale: No hurt Pain Location: head Pain Descriptors / Indicators: Headache Pain Intervention(s): Patient requesting pain meds-RN notified  Home Living                                          Prior Functioning/Environment              Frequency  Min 2X/week        Progress Toward  Goals  OT Goals(current goals can now be found in the care plan section)  Progress towards OT goals: Progressing toward goals  Acute Rehab OT Goals Patient Stated Goal: home OT Goal Formulation: With patient Time For Goal Achievement: 01/15/20 Potential to Achieve Goals: Good ADL Goals Pt Will Perform Lower Body Dressing: with modified independence;sit to/from stand Pt Will Transfer to Toilet: with modified independence;ambulating Pt Will Perform Toileting - Clothing Manipulation and hygiene: with modified independence;sit to/from stand Pt/caregiver will Perform Home Exercise Program:  (goal met) Additional ADL Goal #1: Pt will consistently demonstrate anticipatory awareness during ADL/functional task. Additional ADL Goal #2: Pt will attend to L side of body/L visual field with no more than min cues during functional task.  Plan Discharge plan remains  appropriate    Co-evaluation                 AM-PAC OT "6 Clicks" Daily Activity     Outcome Measure   Help from another person eating meals?: None Help from another person taking care of personal grooming?: A Little Help from another person toileting, which includes using toliet, bedpan, or urinal?: None Help from another person bathing (including washing, rinsing, drying)?: A Little Help from another person to put on and taking off regular upper body clothing?: None Help from another person to put on and taking off regular lower body clothing?: None 6 Click Score: 22    End of Session    OT Visit Diagnosis: Other symptoms and signs involving cognitive function;Other symptoms and signs involving the nervous system (R29.898);Muscle weakness (generalized) (M62.81)   Activity Tolerance Patient tolerated treatment well   Patient Left in chair;with call bell/phone within reach   Nurse Communication          Time: 8676-7209 OT Time Calculation (min): 16 min  Charges: OT General Charges $OT Visit: 1 Visit OT Treatments $Self Care/Home Management : 8-22 mins  Tyrone Schimke, OT Acute Rehabilitation Services Pager: [phone] Office: [phone]    Hortencia Pilar 01/03/2020, 1:52 PM

## 2020-01-03 NOTE — Procedures (Signed)
Procedure:  Fluoro lp  EBL: none  Sample: 10cc crystal clear csf in 4 tubes  Complications: None  Recommendations:  - 2 hrs supine - follow up labs   Signed,  Dulcy Fanny. Earleen Newport, DO

## 2020-01-03 NOTE — Progress Notes (Signed)
Pharmacy Antibiotic Note  Amber [name] is a 29 y.o. female admitted on 12/31/2019 with possible meningitis. Pharmacy has been consulted for acyclovir dosing.  ClCr >100 ml/min, BMI 30. Will use adjusted body weight.   Plan: Start acyclovir 670mg  IV Q8 hr Monitor CSF cultures, clinical status, renal fx Narrow abx as able and f/u duration   Height: 5\' 5"  (165.1 cm) Weight: 83.6 kg (184 lb 4.9 oz) IBW/kg (Calculated) : 57  Temp (24hrs), Avg:98.1 F (36.7 C), Min:97.6 F (36.4 C), Max:98.7 F (37.1 C)  Recent Labs  Lab 12/31/19 1300 12/31/19 1304 12/31/19 1615 12/31/19 2000 01/01/20 0124 01/02/20 0034 01/03/20 0244  WBC 7.9  --   --   --  8.1  --  6.0  CREATININE 0.71 0.60  --   --  0.62 0.64 0.74  LATICACIDVEN  --   --  1.5 0.7  --   --   --     Estimated Creatinine Clearance: 108.7 mL/min (by C-G formula based on SCr of 0.74 mg/dL).    Allergies  Allergen Reactions  . Clarithromycin Anaphylaxis    Biaxin    Antimicrobials this admission: Vanc  10/26  Cefepime 10/26 >>  Acyclovir 10/29>>    Microbiology results: 10/28 CSF: +WBC & RBC, protein 60, BG 66 10/26 BCx: ngtd 10/26 UCx ngtd  10/26 MRSA neg   Thank you for allowing pharmacy to be a part of this patient's care.  Benetta Spar, PharmD, BCPS, BCCP Clinical Pharmacist  Please check AMION for all [place] phone numbers After 10:00 PM, call [place] [phone]

## 2020-01-03 NOTE — Progress Notes (Signed)
Subjective: Patient continues to improve.   Exam: Vitals:   01/03/20 1600 01/03/20 1602  BP: 119/79 106/83  Pulse: 70 82  Resp: 16 16  Temp:    SpO2: 100% 100%   Gen: In bed, NAD Resp: non-labored breathing, no acute distress Abd: soft, nt  Neuro: MS: Awake, alert, no clear neglect today CN: Pupils equal round and reactive, she crosses midline in both directions, visual fields are full Motor: She moves both sides relatively equally today, but with 4+/5 weakness on the left.  Sensory: She endorses symmetric sensation and does not extinguish to double simultaneous stimulation   Pertinent Labs: Cr 0.64 PCT < 0.1 UDS + for opiates and benzos(both given in ED) Blood culture negative x 2 days  Impression: 29 year old female with focal right parietal deficits of unclear etiology.  Her initial CT perfusion scan did suggest hypoperfusion in this area which originally made me suspect that this was ischemia, but with persistent negative MRI as well as persistent symptoms, I think that stroke is very unlikely.  With headaches, I do think that CSF sampling is needed.  One other cause of hypoperfusion is post ictal state, and if she is intermittently having seizures, this could explain her symptoms.    She has refused continuous EEG.   Recommendations: 1) LP for cells, glucose, protein, ACE, crypto antigen, fungal smear, further testing depending on initial results 2) neurology will continue to follow  Roland Rack, MD [facility] [phone]  If 7pm- 7am, please page neurology on call as listed in [place].

## 2020-01-03 NOTE — Progress Notes (Addendum)
Dr. Doristine Bosworth responded with update with instruction to update Kirpatrick. Dr. Leonel Ramsay updated via AMION Dr. Doristine Bosworth updated with Critical  WBC value on CSF at 14 via secure chat and AMION.

## 2020-01-03 NOTE — Progress Notes (Signed)
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 In the ED was having severe right temporal headache 10 out of 10 multiple attempts to get CT were unsuccessful subsequently needing sedation, intubation and was managed in ICU seen by PCCM, neurology Blood culture urine cultures were ordered 10/26, patient was placed on empiric cefepime, vancomycin Forehead no evidence of acute abnormality chronic microhemorrhages, underwent EEG. Patient was extubated 10/27 Transferred to [facility] 10/28 Underwent repeat EEG 10/28   Assessment & Plan:  Transient neuro deficit in the setting of severe headache: TIA versus seizure or complicated migraine: Seen by neurology MRI brain negative for acute stroke, scattered supra and infratentorial microhemorrhages, underwent a stroke work-up with LDL 111, 2D echo-LVEF 60 to 26%, normal diastolic parameters normal right ventricular function-no other acute findings, hemoglobin A1c 5.5.  Undergoing extensive evaluation by neuro, had EEG which also does not support epileptiform activity.  Has elevated CRP and ESR.  Neurology wants to pursue LP for cells, glucose, protein, ACE, crypto antigen, fungal smear and further testing and also attempted but failed.  IR has been consulted for LP now.  Acute agitation unclear etiology concern for withdrawal but seems to be taking methadone.  Intubated in the ED for airway protection , off sedation, continue medication as needed.  Currently stable.  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  Continue methadone and increase slowly at home dose 115 mg, noted to have prolonged QTC 569 so unable to use a full dose-pharmacy  monitoring, continue dose per pharmacy monitor electrolytes.  Low-grade fever overnight 100.5: Last temperature on 01/01/2020.  Culture blood culture negative since 10/26.and chest x-ray 10/27 was clear.  Was placed on vanco/cefepime on admission and currently on cefepime.  Remains afebrile.  No leukocytosis.  Hypokalemia : Resolved.  Acute respiratory failure :needing intubation for agitation now extubated 10/27,   Thyroid nodule, left, will need ultrasound f/u as outpatient.  Nutrition: Diet Order            Diet NPO time specified  Diet effective now                  Body mass index is 30.67 kg/m.  DVT prophylaxis: SCDs Start: 12/31/19 1522 Code Status:   Code Status: Full Code  Family Communication: plan of care discussed with patient at bedside.  Status is: Inpatient  Remains inpatient appropriate because:Ongoing diagnostic testing needed not appropriate for outpatient work up and Inpatient level of care appropriate due to severity of illness   Dispo: The patient is from: Home              Anticipated d/c is to: TBD              Anticipated d/c date is: 3 days              Patient currently is not medically stable to d/c.    Consultants:see note  Procedures:see note  EEG 10/27 : On propofol Versed phenobarbital.  This study is suggestive of profound diffuse encephalopathy, nonspecific etiology but likely related to sedation.  No seizures or epileptiform discharges were seen throughout the recording.  EEG 10/28:This studyshowed lateralized rhythmic delta activity in right hemisphere ( LRDA) which is on  the ictal-interictal continuum with low potential for seizures and can also be seen secondary to underlying structural abnormality, post-ictal state.  Additionally, there is evidence of moderate diffuse encephalopathy, nonspecific etiology. No definite seizures or epileptiform discharges were seen throughout the recording   Culture/Microbiology    Component Value  Date/Time   SDES URINE, RANDOM 12/31/2019 2028   [place] 12/31/2019 2028   CULT  12/31/2019 2028    NO GROWTH Performed at [facility] [address] [place], [place] [address]    REPTSTATUS 01/02/2020 FINAL 12/31/2019 2028    Other culture-see note  Medications: Scheduled Meds: . aspirin  325 mg Per Tube Daily   Or  . aspirin  300 mg Rectal Daily  . chlorhexidine gluconate (MEDLINE KIT)  15 mL Mouth Rinse BID  . Chlorhexidine Gluconate Cloth  6 each Topical Daily  . methadone  80 mg Oral Daily  . pantoprazole (PROTONIX) IV  40 mg Intravenous QHS  . topiramate  50 mg Oral BID   Continuous Infusions: . sodium chloride Stopped (01/02/20 0347)  . ceFEPime (MAXIPIME) IV 2 g (01/03/20 0951)    Antimicrobials: Anti-infectives (From admission, onward)   Start     Dose/Rate Route Frequency Ordered Stop   01/01/20 0200  ceFEPIme (MAXIPIME) 2 g in sodium chloride 0.9 % 100 mL IVPB        2 g 200 mL/hr over 30 Minutes Intravenous Every 8 hours 12/31/19 1646     12/31/19 1700  vancomycin (VANCOREADY) IVPB 2000 mg/400 mL        2,000 mg 200 mL/hr over 120 Minutes Intravenous  Once 12/31/19 1626 12/31/19 1921   12/31/19 1700  ceFEPIme (MAXIPIME) 2 g in sodium chloride 0.9 % 100 mL IVPB        2 g 200 mL/hr over 30 Minutes Intravenous STAT 12/31/19 1646 12/31/19 1758    Subjective Patient seen and examined the assisted the bedside.  Patient complains of throbbing right temporal headache and claims that it has been constant.  She claimed 10 out of 10 pain but looked comfortable.  No other complaint.  Objective: Vitals: Today's Vitals   01/03/20 0216 01/03/20 0538 01/03/20 0813 01/03/20 1138  BP: 109/64 (!) 98/54 101/67 110/76  Pulse: 70 72 83 (!) 108  Resp: $Remo'16 18 18 16  'qcmzY$ Temp: 98.7 F (37.1 C) 97.6 F (36.4 C) 97.8 F (36.6 C) 98 F (36.7 C)  TempSrc: Oral Oral Oral Oral  SpO2: 96% 98% 99% 98%  Weight:  83.6 kg    Height:      PainSc:         Intake/Output Summary (Last 24 hours) at 01/03/2020 1409 Last data filed at 01/03/2020 0600 Gross per 24 hour  Intake 440 ml  Output --  Net 440 ml   Filed Weights   12/31/19 1200 01/01/20 0426 01/03/20 0538  Weight: 84.4 kg 86.3 kg 83.6 kg   Weight change:   Intake/Output from previous day: 10/28 0701 - 10/29 0700 In: 540 [P.O.:240; IV Piggyback:300] Out: -  Intake/Output this shift: No intake/output data recorded.  Examination: General exam: Appears calm and comfortable  Respiratory system: Clear to auscultation. Respiratory effort normal. Cardiovascular system: S1 & S2 heard, RRR. No JVD, murmurs, rubs, gallops or clicks. No pedal edema. Gastrointestinal system: Abdomen is nondistended, soft and nontender. No organomegaly or masses felt. Normal bowel sounds heard. Central nervous system: Alert and oriented. No focal neurological deficits. Extremities: Symmetric 5 x 5 power. Skin: No rashes,  lesions or ulcers.  Psychiatry: Judgement and insight appear normal. Mood & affect appropriate.    Data Reviewed: I have personally reviewed following labs and imaging studies CBC: Recent Labs  Lab 12/31/19 1300 12/31/19 1304 01/01/20 0124 01/03/20 0244  WBC 7.9  --  8.1 6.0  NEUTROABS 5.6  --   --  3.2  HGB 13.6 13.6 12.5 13.1  HCT 40.2 40.0 37.5 38.0  MCV 96.6  --  96.6 94.8  PLT 263  --  201 261   Basic Metabolic Panel: Recent Labs  Lab 12/31/19 1300 12/31/19 1304 01/01/20 0124 01/02/20 0034 01/03/20 0244  NA 137 140 139 140 137  K 3.3* 3.3* 3.4* 3.0* 4.0  CL 103 105 109 107 107  CO2 24  --  23 23 21*  GLUCOSE 136* 134* 118* 94 105*  BUN <5* 4* <5* <5* 6  CREATININE 0.71 0.60 0.62 0.64 0.74  CALCIUM 9.3  --  8.4* 8.8* 9.7  MG  --   --  1.8 1.8 2.0  PHOS  --   --  3.2  --   --    GFR: Estimated Creatinine Clearance: 108.7 mL/min (by C-G formula based on SCr of 0.74 mg/dL). Liver Function Tests: Recent Labs  Lab 12/31/19 1300 01/03/20 0244  AST 19 33   ALT 24 21  ALKPHOS 50 44  BILITOT 0.6 0.9  PROT 7.8 7.8  ALBUMIN 4.3 4.0   No results for input(s): LIPASE, AMYLASE in the last 168 hours. No results for input(s): AMMONIA in the last 168 hours. Coagulation Profile: Recent Labs  Lab 12/31/19 1300  INR 1.0   Cardiac Enzymes: No results for input(s): CKTOTAL, CKMB, CKMBINDEX, TROPONINI in the last 168 hours. BNP (last 3 results) No results for input(s): PROBNP in the last 8760 hours. HbA1C: Recent Labs    01/01/20 0124  HGBA1C 5.5   CBG: Recent Labs  Lab 12/31/19 1258 12/31/19 2316  GLUCAP 137* 137*   Lipid Profile: Recent Labs    12/31/19 2000 01/01/20 0124  CHOL  --  166  HDL  --  33*  LDLCALC  --  111*  TRIG 91 110  CHOLHDL  --  5.0   Thyroid Function Tests: No results for input(s): TSH, T4TOTAL, FREET4, T3FREE, THYROIDAB in the last 72 hours. Anemia Panel: No results for input(s): VITAMINB12, FOLATE, FERRITIN, TIBC, IRON, RETICCTPCT in the last 72 hours. Sepsis Labs: Recent Labs  Lab 12/31/19 1615 12/31/19 2000 01/01/20 0124 01/02/20 0034  PROCALCITON <0.10  --  <0.10 <0.10  LATICACIDVEN 1.5 0.7  --   --     Recent Results (from the past 240 hour(s))  Respiratory Panel by RT PCR (Flu A&B, Covid) - Nasopharyngeal Swab     Status: None   Collection Time: 12/31/19  3:25 PM   Specimen: Nasopharyngeal Swab  Result Value Ref Range Status   SARS Coronavirus 2 by RT PCR NEGATIVE NEGATIVE Final    Comment: (NOTE) SARS-CoV-2 target nucleic acids are NOT DETECTED.  The SARS-CoV-2 RNA is generally detectable in upper respiratoy specimens during the acute phase of infection. The lowest concentration of SARS-CoV-2 viral copies this assay can detect is 131 copies/mL. A negative result does not preclude SARS-Cov-2 infection and should not be used as the sole basis for treatment or other patient management decisions. A negative result may occur with  improper specimen collection/handling, submission of  specimen other than nasopharyngeal swab, presence of viral mutation(s) within the areas targeted by this assay, and  inadequate number of viral copies (<131 copies/mL). A negative result must be combined with clinical observations, patient history, and epidemiological information. The expected result is Negative.  Fact Sheet for Patients:  PinkCheek.be  Fact Sheet for Healthcare Providers:  GravelBags.it  This test is no t yet approved or cleared by the [place] FDA and  has been authorized for detection and/or diagnosis of SARS-CoV-2 by FDA under an Emergency Use Authorization (EUA). This EUA will remain  in effect (meaning this test can be used) for the duration of the COVID-19 declaration under Section 564(b)(1) of the Act, 21 U.S.C. section 360bbb-3(b)(1), unless the authorization is terminated or revoked sooner.     Influenza A by PCR NEGATIVE NEGATIVE Final   Influenza B by PCR NEGATIVE NEGATIVE Final    Comment: (NOTE) The Xpert Xpress SARS-CoV-2/FLU/RSV assay is intended as an aid in  the diagnosis of influenza from Nasopharyngeal swab specimens and  should not be used as a sole basis for treatment. Nasal washings and  aspirates are unacceptable for Xpert Xpress SARS-CoV-2/FLU/RSV  testing.  Fact Sheet for Patients: PinkCheek.be  Fact Sheet for Healthcare Providers: GravelBags.it  This test is not yet approved or cleared by the [place] FDA and  has been authorized for detection and/or diagnosis of SARS-CoV-2 by  FDA under an Emergency Use Authorization (EUA). This EUA will remain  in effect (meaning this test can be used) for the duration of the  Covid-19 declaration under Section 564(b)(1) of the Act, 21  U.S.C. section 360bbb-3(b)(1), unless the authorization is  terminated or revoked. Performed at [facility], [place] [address],  [place], [place] [address]   Culture, blood (routine x 2)     Status: None (Preliminary result)   Collection Time: 12/31/19  4:15 PM   Specimen: BLOOD  Result Value Ref Range Status   Specimen Description BLOOD SITE NOT SPECIFIED  Final   Special Requests   Final    BOTTLES DRAWN AEROBIC AND ANAEROBIC Blood Culture adequate volume   Culture   Final    NO GROWTH 2 DAYS Performed at [facility], [place] [address] [place], [place] [address]    Report Status PENDING  Incomplete  Culture, blood (routine x 2)     Status: None (Preliminary result)   Collection Time: 12/31/19  4:36 PM   Specimen: BLOOD  Result Value Ref Range Status   Specimen Description BLOOD SITE NOT SPECIFIED  Final   Special Requests   Final    BOTTLES DRAWN AEROBIC ONLY Blood Culture results may not be optimal due to an inadequate volume of blood received in culture bottles   Culture   Final    NO GROWTH 2 DAYS Performed at [facility], [place] [address], [place], [place] [address]    Report Status PENDING  Incomplete  MRSA PCR Screening     Status: None   Collection Time: 12/31/19  7:13 PM   Specimen: Nasal Mucosa; Nasopharyngeal  Result Value Ref Range Status   MRSA by PCR NEGATIVE NEGATIVE Final    Comment:        The GeneXpert MRSA Assay (FDA approved for NASAL specimens only), is one component of a comprehensive MRSA colonization surveillance program. It is not intended to diagnose MRSA infection nor to guide or monitor treatment for MRSA infections. Performed at [facility], [place] [address], [place], [place] [address]   Urine culture     Status: None   Collection  Time: 12/31/19  8:28 PM   Specimen: Urine, Random  Result Value Ref Range Status   Specimen Description URINE, RANDOM  Final   Special Requests NONE  Final   Culture   Final    NO GROWTH Performed at [facility], [address] [place], [place] [address]    Report Status 02/01/20 FINAL  Final     Radiology  Studies: EEG adult  Result Date: 02-01-20 Lora Havens, MD     February 01, 2020  8:37 AM Patient Name: Amber [name] MRN: [id number] Epilepsy Attending: Lora Havens Referring Physician/Provider: Burnetta Sabin Date: 01/01/2020 Duration: 22.32 mins  Patient history: 29 year old female with history of IV drug use presented with left hemianopsia and neglect. EEG to evaluate for seizure.  Level of alertness: awake  AEDs during EEG study: Lorazepam, topiramate  Technical aspects: This EEG study was done with scalp electrodes positioned according to the 10-20 International system of electrode placement. Electrical activity was acquired at a sampling rate of 500Hz  and reviewed with a high frequency filter of 70Hz  and a low frequency filter of 1Hz . EEG data were recorded continuously and digitally stored.  Description: EEG showed continuous rhythmic 2-3hz  delta slowing as well 6-9hz  theta-alpha activity in left hemisphere. Hyperventilation and photic stimulation were not performed.    ABNORMALITY - Continuous rhythmic delta slow,  Lateralized right hemisphere - Continuous slow, generalized  IMPRESSION: This study showed lateralized rhythmic delta activity in right hemisphere ( LRDA) which is on the ictal-interictal continuum with low potential for seizures and can also be seen secondary to underlying structural abnormality, post-ictal state.  Additionally, there is evidence of moderate diffuse encephalopathy, nonspecific etiology. No definite seizures or epileptiform discharges were seen throughout the recording.   Lora Havens     LOS: 3 days   Darliss Cheney, MD [facility]  01/03/2020, 2:09 PM

## 2020-01-03 NOTE — Evaluation (Signed)
Speech Language Pathology Evaluation Patient Details Name: Amber [name] MRN: [id number] DOB: 28-Jan-1991 Today's Date: 01/03/2020 Time: 9390-3009 SLP Time Calculation (min) (ACUTE ONLY): 31 min  Problem List:  Patient Active Problem List   Diagnosis Date Noted  . Acute left-sided weakness 12/31/2019  . Mania ([place]) 12/31/2019  . Pregnancy examination or test, negative result 07/20/2018  . Encounter for Nexplanon removal 07/20/2018  . Nexplanon removal 06/03/2015  . Nexplanon insertion 06/03/2015  . Pyelonephritis 05/11/2013  . Renal abscess 05/11/2013  . Fever, unspecified 05/11/2013  . Tobacco use disorder 05/11/2013  . Sepsis ([place]) 05/11/2013  . SVD (spontaneous vaginal delivery) 07/02/2012  . IC (interstitial cystitis) 04/17/2012   Past Medical History:  Past Medical History:  Diagnosis Date  . Abnormal Pap smear   . GBS (group B streptococcus) UTI complicating pregnancy 2/33/0076  . IC (interstitial cystitis) 04/17/2012  . Interstitial cystitis   . Ovarian cyst   . Protein in urine   . Renal abscess    Past Surgical History:  Past Surgical History:  Procedure Laterality Date  . DILATION AND CURETTAGE OF UTERUS    . IR FLUORO GUIDED NEEDLE PLC ASPIRATION/INJECTION LOC  01/03/2020   HPI:   Amber [name] is a 29 y.o. female proteinuria, group B streptococcus and urine, and migraine headaches. H/o IV drug use. Presents with L hemianopia and L neglect. MRI 10/26 shows no evidence of acute abnormalities, but revealed Scattered supratentorial and infratentorial chronic microhemorrhages.  EEG 10/28: lateralized rhythmic delta activity in right hemisphere ( LRDA) which is on the ictal-interictal continuum with low potential for seizures and can also be seen secondary to underlying structural abnormality, post-ictal state.  Moderate diffuse encephalopathy, nonspecific etiology. Repeat MRI and continuous EEG were attempted but pt has refused both as of 10/29   Assessment / Plan /  Recommendation Clinical Impression  Pt participated in speech/language/cognition evaluation. Pt denied any baseline deficits or acute changes in speech, language, or cognition. 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 The [facility] Mental Status Examination was completed to evaluate the pt's cognitive-linguistic skills. She achieved a score of 21/30 which is below the normal limits of 27 or more out of 30 and is suggestive of a mild impairment. She exhibited difficulty in the areas of awareness, attention, memory, complex problem solving, and executive function. Skilled SLP services are clinically indicated at this time to improve cognitive-linguistic function.    SLP Assessment  SLP Recommendation/Assessment: Patient needs continued Speech Lanaguage Pathology Services SLP Visit Diagnosis: Cognitive communication deficit (R41.841)    Follow Up Recommendations  Outpatient SLP    Frequency and Duration min 2x/week  2 weeks      SLP Evaluation Cognition  Overall Cognitive Status: Impaired/Different from baseline Arousal/Alertness: Awake/alert Orientation Level: Oriented X4 Attention: Focused;Sustained Focused Attention: Impaired Focused Attention Impairment: Verbal complex Sustained Attention: Impaired Sustained Attention Impairment: Verbal complex Memory: Impaired Memory Impairment: Retrieval deficit (Immediate & delayed: 5/5 with additional time. ) Awareness: Impaired Awareness Impairment: Emergent impairment Problem Solving: Impaired Problem Solving Impairment: Verbal complex Executive Function: Reasoning;Sequencing;Organizing Reasoning: Impaired Reasoning Impairment: Verbal complex Sequencing: Impaired Sequencing Impairment: Verbal complex (Clock drawing: 0/4) Organizing: Impaired Organizing Impairment: Verbal complex (backward  digit span: 2/2)       Comprehension  Auditory Comprehension Overall Auditory Comprehension: Appears within functional limits for tasks assessed Yes/No Questions: Within Functional  Limits Commands: Within Functional Limits Conversation: Complex    Expression Expression Primary Mode of Expression: Verbal Verbal Expression Overall Verbal Expression: Appears within functional limits for tasks assessed Initiation: No impairment Automatic Speech: Day of week;Counting;Month of year (WNL) Level of Generative/Spontaneous Verbalization: Conversation Repetition: No impairment (5/5) Naming: No impairment Pragmatics: No impairment   Oral / Motor  Oral Motor/Sensory Function Overall Oral Motor/Sensory Function: Within functional limits Motor Speech Overall Motor Speech: Appears within functional limits for tasks assessed Respiration: Within functional limits Phonation: Normal Resonance: Within functional limits Articulation: Within functional limitis Intelligibility: Intelligible Motor Planning: Witnin functional limits Motor Speech Errors: Not applicable    I. Amber [name], [place], [place] Office number [phone] Pager [place] 01/03/2020, 5:55 PM

## 2020-01-03 NOTE — Progress Notes (Signed)
SLP Cancellation Note  Patient Details Name: Amber [name] MRN: [id number] DOB: [birth year]   Cancelled treatment:       Reason Eval/Treat Not Completed: Patient declined, no reason specified (Despite encouragement, pt refused eval and requested that SLP come back later. SLP will re-attempt later as able.)   I. Hardin Negus, [place], [place] Office number [phone] Pager [place] 01/03/2020, 9:29 AM

## 2020-01-03 NOTE — Progress Notes (Signed)
Called the floor to ask if patient was willing to have MRI today.  Patient is refusing MRI, this is the third day of patient refusal of MRI scan.  Will check again about coming for MRI.

## 2020-01-03 NOTE — Progress Notes (Signed)
Pharmacy Antibiotic Note  Amber [name] is a 29 y.o. female admitted on 12/31/2019 with left side neglect concerning for embolic stroke.  Pharmacy has been consulted for cefepime dosing for rue out bacteremia, concerning for septic emboli. Cultures unrevealing, Cr stable.   Plan: Cefepime 2gm IV Q8H Monitor renal fxn, clinical progress F/U with the need to continue MRSA coverage  Height: 5\' 5"  (165.1 cm) Weight: 83.6 kg (184 lb 4.9 oz) IBW/kg (Calculated) : 57  Temp (24hrs), Avg:98.5 F (36.9 C), Min:97.6 F (36.4 C), Max:99.5 F (37.5 C)  Recent Labs  Lab 12/31/19 1300 12/31/19 1304 12/31/19 1615 12/31/19 2000 01/01/20 0124 01/02/20 0034 01/03/20 0244  WBC 7.9  --   --   --  8.1  --  6.0  CREATININE 0.71 0.60  --   --  0.62 0.64 0.74  LATICACIDVEN  --   --  1.5 0.7  --   --   --     Estimated Creatinine Clearance: 108.7 mL/min (by C-G formula based on SCr of 0.74 mg/dL).    Allergies  Allergen Reactions  . Clarithromycin Anaphylaxis    Biaxin   Antimicrobials: Cefepime 10/26 >> Vanc x1 10/26  Microbiology: 10/26 UCx - negative 10/26 BCx - NGTD   Arrie Senate, PharmD, BCPS Clinical Pharmacist [phone] Please check AMION for all [place] numbers 01/03/2020

## 2020-01-03 NOTE — Progress Notes (Addendum)
Physical Therapy Treatment Patient Details Name: Amber [name] MRN: [id number] DOB: [birth year] Today's Date: 01/03/2020    History of Present Illness Pt is is a 29 y/o female with a PMHx of IVDU who presents to the ED with c/o left-sided neglect, weakness concerning for embolic stroke. MRI with no acute findings. Revealed scattered supratentorial and infratentorial chronic microhemorrhages. EEG completed 10/27 and suggestive of profound diffuse encephalopathy, nonspecific etiology but likely related to sedation.     PT Comments    Pt doing well with mobility and no further PT needed.      Follow Up Recommendations  No PT follow up     Equipment Recommendations  None recommended by PT    Recommendations for Other Services       Precautions / Restrictions Precautions Precautions: Fall    Mobility  Bed Mobility Overal bed mobility: Independent                Transfers Overall transfer level: Independent Equipment used: None Transfers: Sit to/from Stand Sit to Stand: Independent            Ambulation/Gait Ambulation/Gait assistance: Independent Gait Distance (Feet): 475 Feet Assistive device: None Gait Pattern/deviations: WFL(Within Functional Limits) Gait velocity: normal Gait velocity interpretation: >4.37 ft/sec, indicative of normal walking speed General Gait Details: Pt with steady gait. Able to avoid obstacles in very busy environment. Able to manage IV pole if needed.    Stairs             Wheelchair Mobility    Modified Rankin (Stroke Patients Only) Modified Rankin (Stroke Patients Only) Pre-Morbid Rankin Score: No significant disability Modified Rankin: No significant disability     Balance Overall balance assessment: Independent                             High Level Balance Comments: Pt able to perform tandem standing and rhomberg without difficulty            Cognition Arousal/Alertness: Awake/alert Behavior  During Therapy: WFL for tasks assessed/performed Overall Cognitive Status: Within Functional Limits for tasks assessed                                 General Comments: For tasks and activities during this session      Exercises      General Comments        Pertinent Vitals/Pain Pain Assessment: 0-10 Pain Score: 10-Worst pain ever Pain Location: head Pain Descriptors / Indicators: Headache Pain Intervention(s): Patient requesting pain meds-RN notified    Home Living                      Prior Function            PT Goals (current goals can now be found in the care plan section) Acute Rehab PT Goals Patient Stated Goal: home Progress towards PT goals: Goals met/education completed, patient discharged from PT    Frequency           PT Plan Discharge plan needs to be updated    Co-evaluation              AM-PAC PT "6 Clicks" Mobility   Outcome Measure  Help needed turning from your back to your side while in a flat bed without using bedrails?: None Help needed moving from lying on your back  to sitting on the side of a flat bed without using bedrails?: None Help needed moving to and from a bed to a chair (including a wheelchair)?: None Help needed standing up from a chair using your arms (e.g., wheelchair or bedside chair)?: None Help needed to walk in hospital room?: None Help needed climbing 3-5 steps with a railing? : None 6 Click Score: 24    End of Session   Activity Tolerance: Patient tolerated treatment well Patient left: Other (comment) (standing in room) Nurse Communication: Mobility status;Patient requests pain meds PT Visit Diagnosis: Other abnormalities of gait and mobility (R26.89)     Time: 7034-0352 PT Time Calculation (min) (ACUTE ONLY): 18 min  Charges:  $Gait Training: 8-22 mins                     [place] Pager [phone] Office [place] 01/03/2020, 11:07 AM

## 2020-01-03 NOTE — Progress Notes (Signed)
Chief Complaint: Patient was seen in consultation today for lumbar puncture  Referring Physician(s):  Dr. Leonel Ramsay  Supervising Physician: Corrie Mckusick  Patient Status: [facility] - In-pt  History of Present Illness: Amber [name] is a 29 y.o. female being worked up for focal parietal deficits. LP was attempted multiple times but the pt could not stay still or tolerate the procedure. IR is asked to perform lumbar puncture with sedation. PMHx, meds, labs, imaging, allergies reviewed. Feels well, no recent fevers, chills, illness. Has been NPO today as directed.    Past Medical History:  Diagnosis Date  . Abnormal Pap smear   . GBS (group B streptococcus) UTI complicating pregnancy 3/35/4562  . IC (interstitial cystitis) 04/17/2012  . Interstitial cystitis   . Ovarian cyst   . Protein in urine   . Renal abscess     Past Surgical History:  Procedure Laterality Date  . DILATION AND CURETTAGE OF UTERUS      Allergies: Clarithromycin  Medications:  Current Facility-Administered Medications:  .  0.9 %  sodium chloride infusion, , Intravenous, PRN, Candee Furbish, MD, Stopped at 01/02/20 [phone] .  acetaminophen (TYLENOL) tablet 650 mg, 650 mg, Oral, Q4H PRN, Jose Persia, MD, 650 mg at 01/03/20 1050 .  aspirin suppository 300 mg, 300 mg, Rectal, Daily **OR** aspirin tablet 325 mg, 325 mg, Per Tube, Daily, Greta Doom, MD, 325 mg at 01/03/20 0933 .  ceFEPIme (MAXIPIME) 2 g in sodium chloride 0.9 % 100 mL IVPB, 2 g, Intravenous, Q8H, Dang, Thuy D, RPH, Last Rate: 200 mL/hr at 01/03/20 0951, 2 g at 01/03/20 0951 .  chlorhexidine gluconate (MEDLINE KIT) (PERIDEX) 0.12 % solution 15 mL, 15 mL, Mouth Rinse, BID, Anders Simmonds, MD, 15 mL at 01/02/20 0902 .  Chlorhexidine Gluconate Cloth 2 % PADS 6 each, 6 each, Topical, Daily, Candee Furbish, MD, 6 each at 01/02/20 [phone] .  docusate sodium (COLACE) capsule 100 mg, 100 mg, Oral, BID PRN, Jose Persia, MD .   fentaNYL (SUBLIMAZE) bolus via infusion 50 mcg, 50 mcg, Intravenous, Q15 min PRN, Pfeiffer, Marcy, MD .  fentaNYL (SUBLIMAZE) injection 50-200 mcg, 50-200 mcg, Intravenous, Q30 min PRN, Pfeiffer, Marcy, MD .  ibuprofen (ADVIL) tablet 400 mg, 400 mg, Oral, Q6H PRN, Olalere, Adewale A, MD, 400 mg at 01/02/20 1729 .  LORazepam (ATIVAN) injection 2-4 mg, 2-4 mg, Intravenous, Q4H PRN, Gwinda Maine, MD, 4 mg at 01/02/20 1221 .  methadone (DOLOPHINE) tablet 80 mg, 80 mg, Oral, Daily, Olalere, Adewale A, MD, 80 mg at 01/03/20 0932 .  pantoprazole (PROTONIX) injection 40 mg, 40 mg, Intravenous, QHS, Jose Persia, MD, 40 mg at 01/02/20 2301 .  polyethylene glycol (MIRALAX / GLYCOLAX) packet 17 g, 17 g, Oral, Daily PRN, Jose Persia, MD .  topiramate (TOPAMAX) tablet 50 mg, 50 mg, Oral, BID, Garvin Fila, MD, 50 mg at 01/03/20 0902    Family History  Problem Relation Age of Onset  . Cancer Maternal Uncle   . Diabetes Maternal Grandmother   . Cancer Maternal Grandmother        colon  . Diabetes Paternal Grandmother   . Cancer Paternal Grandmother        bone  . Other Neg Hx     Social History   Socioeconomic History  . Marital status: Single    Spouse name: Not on file  . Number of children: Not on file  . Years of education: Not on file  . Highest education  level: Not on file  Occupational History  . Not on file  Tobacco Use  . Smoking status: Current Every Day Smoker    Packs/day: 0.50    Years: 4.00    Pack years: 2.00    Types: Cigarettes  . Smokeless tobacco: Never Used  Vaping Use  . Vaping Use: Never used  Substance and Sexual Activity  . Alcohol use: No    Comment: occ  . Drug use: No    Types: Benzodiazepines, Marijuana, Oxycodone    Comment: no drug use since pregnancy-  NONE SINCE   . Sexual activity: Yes    Birth control/protection: Implant  Other Topics Concern  . Not on file  Social History Narrative  . Not on file   Social Determinants of Health     Financial Resource Strain:   . Difficulty of Paying Living Expenses: Not on file  Food Insecurity:   . Worried About Programme researcher, broadcasting/film/video in the Last Year: Not on file  . Ran Out of Food in the Last Year: Not on file  Transportation Needs:   . Lack of Transportation (Medical): Not on file  . Lack of Transportation (Non-Medical): Not on file  Physical Activity:   . Days of Exercise per Week: Not on file  . Minutes of Exercise per Session: Not on file  Stress:   . Feeling of Stress : Not on file  Social Connections:   . Frequency of Communication with Friends and Family: Not on file  . Frequency of Social Gatherings with Friends and Family: Not on file  . Attends Religious Services: Not on file  . Active Member of Clubs or Organizations: Not on file  . Attends Banker Meetings: Not on file  . Marital Status: Not on file     Review of Systems: A 12 point ROS discussed and pertinent positives are indicated in the HPI above.  All other systems are negative.  Review of Systems  Vital Signs: BP 101/67 (BP Location: Left Arm)   Pulse 83   Temp 97.8 F (36.6 C) (Oral)   Resp 18   Ht 5\' 5"  (1.651 m)   Wt 83.6 kg   LMP  (LMP Unknown) Comment: code stroke  SpO2 99%   BMI 30.67 kg/m   Physical Exam Constitutional:      Appearance: She is not ill-appearing.  HENT:     Mouth/Throat:     Mouth: Mucous membranes are moist.     Pharynx: Oropharynx is clear.  Cardiovascular:     Rate and Rhythm: Normal rate and regular rhythm.     Heart sounds: Normal heart sounds.  Pulmonary:     Effort: Pulmonary effort is normal. No respiratory distress.     Breath sounds: Normal breath sounds.  Neurological:     Mental Status: She is alert and oriented to person, place, and time.  Psychiatric:        Thought Content: Thought content normal.        Judgment: Judgment normal.       Imaging: CT Code Stroke CTA Head W/WO contrast  Result Date: 12/31/2019 CLINICAL DATA:   Code stroke follow-up, left weakness and neglect EXAM: CT ANGIOGRAPHY HEAD AND NECK CT PERFUSION BRAIN TECHNIQUE: Multidetector CT imaging of the head and neck was performed using the standard protocol during bolus administration of intravenous contrast. Multiplanar CT image reconstructions and MIPs were obtained to evaluate the vascular anatomy. Carotid stenosis measurements (when applicable) are obtained utilizing NASCET  criteria, using the distal internal carotid diameter as the denominator. Multiphase CT imaging of the brain was performed following IV bolus contrast injection. Subsequent parametric perfusion maps were calculated using RAPID software. CONTRAST:  122mL OMNIPAQUE IOHEXOL 350 MG/ML SOLN COMPARISON:  None. FINDINGS: Suboptimal arterial contrast bolus timing. CTA NECK FINDINGS Aortic arch: Great vessel origins are patent. Right carotid system: Patent. No measurable stenosis or evidence of dissection. Left carotid system: Patent. No measurable stenosis or evidence of dissection. Vertebral arteries: Patent. Right vertebral artery slightly dominant. No measurable stenosis or evidence of dissection. Skeleton: No significant abnormality. Other neck: There is a 1.3 cm nodule of the left thyroid. Upper chest: Partially imaged bilateral dependent atelectasis/consolidation. Review of the MIP images confirms the above findings CTA HEAD FINDINGS Anterior circulation: Intracranial internal carotid arteries are patent. Anterior and middle cerebral arteries are patent. Posterior circulation: Intracranial right vertebral artery is patent. There is diminished visualization of the mid left intracranial vertebral artery. Basilar artery is patent. Posterior cerebral arteries are patent. A right posterior communicating artery is present. Venous sinuses: As permitted by contrast timing, patent. Review of the MIP images confirms the above findings CT Brain Perfusion Findings: CBF (<30%) Volume: 0 mL, 5 mL right parietal  lobe with less stringent threshold Perfusion (Tmax>6.0s) volume: 0 mL Mismatch Volume: 0 mL Infarction Location: None. IMPRESSION: No large vessel occlusion. Perfusion imaging demonstrates no evidence of core infarction or penumbra, although there may be some right parietal ischemia using less stringent threshold. Diminished visualization of mid left intracranial vertebral artery, which may be related to small caliber vessel. 1.3 cm left thyroid nodule. Recommend nonemergent thyroid US follow-up by current guidelines. (Ref: J Am Coll Radiol. 2015 Feb;12(2): 143-50). Electronically Signed   By: Macy Mis M.D.   On: 12/31/2019 16:11   CT Code Stroke CTA Neck W/WO contrast  Result Date: 12/31/2019 CLINICAL DATA:  Code stroke follow-up, left weakness and neglect EXAM: CT ANGIOGRAPHY HEAD AND NECK CT PERFUSION BRAIN TECHNIQUE: Multidetector CT imaging of the head and neck was performed using the standard protocol during bolus administration of intravenous contrast. Multiplanar CT image reconstructions and MIPs were obtained to evaluate the vascular anatomy. Carotid stenosis measurements (when applicable) are obtained utilizing NASCET criteria, using the distal internal carotid diameter as the denominator. Multiphase CT imaging of the brain was performed following IV bolus contrast injection. Subsequent parametric perfusion maps were calculated using RAPID software. CONTRAST:  185mL OMNIPAQUE IOHEXOL 350 MG/ML SOLN COMPARISON:  None. FINDINGS: Suboptimal arterial contrast bolus timing. CTA NECK FINDINGS Aortic arch: Great vessel origins are patent. Right carotid system: Patent. No measurable stenosis or evidence of dissection. Left carotid system: Patent. No measurable stenosis or evidence of dissection. Vertebral arteries: Patent. Right vertebral artery slightly dominant. No measurable stenosis or evidence of dissection. Skeleton: No significant abnormality. Other neck: There is a 1.3 cm nodule of the left  thyroid. Upper chest: Partially imaged bilateral dependent atelectasis/consolidation. Review of the MIP images confirms the above findings CTA HEAD FINDINGS Anterior circulation: Intracranial internal carotid arteries are patent. Anterior and middle cerebral arteries are patent. Posterior circulation: Intracranial right vertebral artery is patent. There is diminished visualization of the mid left intracranial vertebral artery. Basilar artery is patent. Posterior cerebral arteries are patent. A right posterior communicating artery is present. Venous sinuses: As permitted by contrast timing, patent. Review of the MIP images confirms the above findings CT Brain Perfusion Findings: CBF (<30%) Volume: 0 mL, 5 mL right parietal lobe with less stringent threshold Perfusion (  Tmax>6.0s) volume: 0 mL Mismatch Volume: 0 mL Infarction Location: None. IMPRESSION: No large vessel occlusion. Perfusion imaging demonstrates no evidence of core infarction or penumbra, although there may be some right parietal ischemia using less stringent threshold. Diminished visualization of mid left intracranial vertebral artery, which may be related to small caliber vessel. 1.3 cm left thyroid nodule. Recommend nonemergent thyroid US follow-up by current guidelines. (Ref: J Am Coll Radiol. 2015 Feb;12(2): 143-50). Electronically Signed   By: Macy Mis M.D.   On: 12/31/2019 16:11   MR BRAIN WO CONTRAST  Result Date: 12/31/2019 CLINICAL DATA:  Stroke, follow-up. EXAM: MRI HEAD WITHOUT CONTRAST TECHNIQUE: Multiplanar, multiecho pulse sequences of the brain and surrounding structures were obtained without intravenous contrast. COMPARISON:  Noncontrast head CT and CT angiogram head/neck performed earlier the same day 12/31/2019. FINDINGS: Brain: Cerebral volume is normal. There are scattered supratentorial and infratentorial chronic microhemorrhages which are greater than expected for age and nonspecific. There is no acute infarct. No  evidence of intracranial mass. No extra-axial fluid collection. No midline shift. Vascular: Expected proximal arterial flow voids. Skull and upper cervical spine: No focal marrow lesion. Sinuses/Orbits: Visualized orbits show no acute finding. Mild paranasal sinus mucosal thickening greatest within the ethmoid, right sphenoid and right maxillary sinuses. No significant mastoid effusion. IMPRESSION: No evidence of acute intracranial abnormality, including acute infarction. Scattered supratentorial and infratentorial chronic microhemorrhages which are greater than expected for age and nonspecific. Mild paranasal sinus mucosal thickening. Electronically Signed   By: Kellie Simmering DO   On: 12/31/2019 21:43   CT Code Stroke Cerebral Perfusion with contrast  Result Date: 12/31/2019 CLINICAL DATA:  Code stroke follow-up, left weakness and neglect EXAM: CT ANGIOGRAPHY HEAD AND NECK CT PERFUSION BRAIN TECHNIQUE: Multidetector CT imaging of the head and neck was performed using the standard protocol during bolus administration of intravenous contrast. Multiplanar CT image reconstructions and MIPs were obtained to evaluate the vascular anatomy. Carotid stenosis measurements (when applicable) are obtained utilizing NASCET criteria, using the distal internal carotid diameter as the denominator. Multiphase CT imaging of the brain was performed following IV bolus contrast injection. Subsequent parametric perfusion maps were calculated using RAPID software. CONTRAST:  181mL OMNIPAQUE IOHEXOL 350 MG/ML SOLN COMPARISON:  None. FINDINGS: Suboptimal arterial contrast bolus timing. CTA NECK FINDINGS Aortic arch: Great vessel origins are patent. Right carotid system: Patent. No measurable stenosis or evidence of dissection. Left carotid system: Patent. No measurable stenosis or evidence of dissection. Vertebral arteries: Patent. Right vertebral artery slightly dominant. No measurable stenosis or evidence of dissection. Skeleton: No  significant abnormality. Other neck: There is a 1.3 cm nodule of the left thyroid. Upper chest: Partially imaged bilateral dependent atelectasis/consolidation. Review of the MIP images confirms the above findings CTA HEAD FINDINGS Anterior circulation: Intracranial internal carotid arteries are patent. Anterior and middle cerebral arteries are patent. Posterior circulation: Intracranial right vertebral artery is patent. There is diminished visualization of the mid left intracranial vertebral artery. Basilar artery is patent. Posterior cerebral arteries are patent. A right posterior communicating artery is present. Venous sinuses: As permitted by contrast timing, patent. Review of the MIP images confirms the above findings CT Brain Perfusion Findings: CBF (<30%) Volume: 0 mL, 5 mL right parietal lobe with less stringent threshold Perfusion (Tmax>6.0s) volume: 0 mL Mismatch Volume: 0 mL Infarction Location: None. IMPRESSION: No large vessel occlusion. Perfusion imaging demonstrates no evidence of core infarction or penumbra, although there may be some right parietal ischemia using less stringent threshold. Diminished visualization of  mid left intracranial vertebral artery, which may be related to small caliber vessel. 1.3 cm left thyroid nodule. Recommend nonemergent thyroid US follow-up by current guidelines. (Ref: J Am Coll Radiol. 2015 Feb;12(2): 143-50). Electronically Signed   By: Macy Mis M.D.   On: 12/31/2019 16:11   DG Chest Port 1 View  Result Date: 01/01/2020 CLINICAL DATA:  Stroke EXAM: PORTABLE CHEST 1 VIEW COMPARISON:  Yesterday FINDINGS: Endotracheal tube with tip at the clavicular heads. The enteric tube at least reaches the stomach. The lungs are clear. Normal heart size and mediastinal contours. IMPRESSION: 1. Unremarkable hardware. 2. Clear lungs. Electronically Signed   By: Monte Fantasia M.D.   On: 01/01/2020 07:58   DG Chest Port 1 View  Result Date: 12/31/2019 CLINICAL DATA:   Stroke, endotracheal tube EXAM: PORTABLE CHEST 1 VIEW COMPARISON:  2015 FINDINGS: Endotracheal tube is approximately 3 cm above the carina. Lungs are clear. No pleural effusion or pneumothorax. Normal heart size for technique. IMPRESSION: Endotracheal tube 3 cm above the carina. Electronically Signed   By: Macy Mis M.D.   On: 12/31/2019 15:33   DG Abd Portable 1V  Result Date: 12/31/2019 CLINICAL DATA:  OG tube placement EXAM: PORTABLE ABDOMEN - 1 VIEW COMPARISON:  CT abdomen pelvis 05/29/2013 FINDINGS: Please note this is not a full view of the chest or abdomen. Transesophageal tube tip terminates at the level of the gastric body within the left upper quadrant with side port distal to the GE junction. No high-grade obstructive bowel gas pattern is seen. Lung bases are clear. Additional support devices overlie the chest. Osseous structures are unremarkable. IMPRESSION: Transesophageal tube tip terminates at the level of the gastric body within the left upper quadrant with side port distal to the GE junction. Electronically Signed   By: Lovena Le M.D.   On: 12/31/2019 20:21   EEG adult  Result Date: 01/02/2020 Lora Havens, MD     01/02/2020  8:37 AM Patient Name: Amber [name] MRN: [id number] Epilepsy Attending: Lora Havens Referring Physician/Provider: Burnetta Sabin Date: 01/01/2020 Duration: 22.32 mins  Patient history: 29 year old female with history of IV drug use presented with left hemianopsia and neglect. EEG to evaluate for seizure.  Level of alertness: awake  AEDs during EEG study: Lorazepam, topiramate  Technical aspects: This EEG study was done with scalp electrodes positioned according to the 10-20 International system of electrode placement. Electrical activity was acquired at a sampling rate of 500Hz  and reviewed with a high frequency filter of 70Hz  and a low frequency filter of 1Hz . EEG data were recorded continuously and digitally stored.  Description: EEG showed  continuous rhythmic 2-3hz  delta slowing as well 6-9hz  theta-alpha activity in left hemisphere. Hyperventilation and photic stimulation were not performed.    ABNORMALITY - Continuous rhythmic delta slow,  Lateralized right hemisphere - Continuous slow, generalized  IMPRESSION: This study showed lateralized rhythmic delta activity in right hemisphere ( LRDA) which is on the ictal-interictal continuum with low potential for seizures and can also be seen secondary to underlying structural abnormality, post-ictal state.  Additionally, there is evidence of moderate diffuse encephalopathy, nonspecific etiology. No definite seizures or epileptiform discharges were seen throughout the recording.   Lora Havens   EEG adult  Result Date: 01/01/2020 Lora Havens, MD     01/01/2020  8:36 AM Patient Name: Amber [name] MRN: [id number] Epilepsy Attending: Lora Havens Referring Physician/Provider: Dr. Kathrynn Speed Date: 12/31/2019 Duration: 23.17 minutes Patient history: 29 year old female with  history of IV drug use presented with left hemianopsia and neglect.  EEG to evaluate for seizure. Level of alertness: Comatose AEDs during EEG study: Propofol, Versed, phenobarbital Technical aspects: This EEG study was done with scalp electrodes positioned according to the 10-20 International system of electrode placement. Electrical activity was acquired at a sampling rate of $Remov'500Hz'ODgpOg$  and reviewed with a high frequency filter of $RemoveB'70Hz'ACeQiUFd$  and a low frequency filter of $RemoveB'1Hz'VavsQEiW$ . EEG data were recorded continuously and digitally stored. Description: EEG showed burst suppression pattern with 5-6 seconds of EEG suppression alternating with 2 to 3seconds polymorphic 8-15 Hz alpha-beta activity. Hyperventilation and photic stimulation were not performed.   ABNORMALITY -Burst suppression, generalized IMPRESSION: This study is suggestive of profound diffuse encephalopathy, nonspecific etiology but likely related to sedation.  No  seizures or epileptiform discharges were seen throughout the recording. Lora Havens   ECHOCARDIOGRAM COMPLETE  Result Date: 01/01/2020    ECHOCARDIOGRAM REPORT   Patient Name:   Amber [name] Date of Exam: 01/01/2020 Medical Rec #:  [id number]     Height:       65.0 in Accession #:    [id number]    Weight:       190.3 lb Date of Birth:  [birth year]     BSA:          1.937 m Patient Age:    29 years      BP:           91/58 mmHg Patient Gender: F             HR:           58 bpm. Exam Location:  Inpatient Procedure: 2D Echo, Cardiac Doppler and Color Doppler Indications:    CVA  History:        Patient has no prior history of Echocardiogram examinations.                 Stroke, Signs/Symptoms:Fever; Risk Factors:Current Smoker. IV                 drug abuse, headache, Resp. failure, sepsis.  Sonographer:    Dustin Flock Referring Phys: [id number] [place]  1. Left ventricular ejection fraction, by estimation, is 60 to 65%. The left ventricle has normal function. The left ventricle has no regional wall motion abnormalities. Left ventricular diastolic parameters were normal.  2. Right ventricular systolic function is normal. The right ventricular size is normal. Tricuspid regurgitation signal is inadequate for assessing PA pressure.  3. The mitral valve is normal in structure. Trivial mitral valve regurgitation. No evidence of mitral stenosis.  4. The aortic valve is tricuspid. Aortic valve regurgitation is not visualized. No aortic stenosis is present. Comparison(s): No prior Echocardiogram. Conclusion(s)/Recommendation(s): Normal biventricular function without evidence of hemodynamically significant valvular heart disease. No intracardiac source of embolism detected on this transthoracic study. A transesophageal echocardiogram is recommended to exclude cardiac source of embolism if clinically indicated. FINDINGS  Left Ventricle: Left ventricular ejection fraction, by estimation, is 60 to  65%. The left ventricle has normal function. The left ventricle has no regional wall motion abnormalities. The left ventricular internal cavity size was normal in size. There is  no left ventricular hypertrophy. Left ventricular diastolic parameters were normal. Right Ventricle: The right ventricular size is normal. No increase in right ventricular wall thickness. Right ventricular systolic function is normal. Tricuspid regurgitation signal is inadequate for assessing PA pressure. Left Atrium: Left atrial size was normal in size.  Right Atrium: Right atrial size was normal in size. Pericardium: Trivial pericardial effusion is present. Mitral Valve: The mitral valve is normal in structure. Trivial mitral valve regurgitation. No evidence of mitral valve stenosis. Tricuspid Valve: The tricuspid valve is normal in structure. Tricuspid valve regurgitation is trivial. No evidence of tricuspid stenosis. Aortic Valve: The aortic valve is tricuspid. Aortic valve regurgitation is not visualized. No aortic stenosis is present. Pulmonic Valve: The pulmonic valve was not well visualized. Pulmonic valve regurgitation is not visualized. No evidence of pulmonic stenosis. Aorta: The aortic root, ascending aorta and aortic arch are all structurally normal, with no evidence of dilitation or obstruction. Venous: IVC assessment for right atrial pressure unable to be performed due to mechanical ventilation. IAS/Shunts: No atrial level shunt detected by color flow Doppler.  LEFT VENTRICLE PLAX 2D LVIDd:         4.70 cm  Diastology LVIDs:         3.00 cm  LV e' medial:    8.81 cm/s LV PW:         0.90 cm  LV E/e' medial:  10.6 LV IVS:        1.10 cm  LV e' lateral:   10.80 cm/s LVOT diam:     2.00 cm  LV E/e' lateral: 8.6 LV SV:         75 LV SV Index:   39 LVOT Area:     3.14 cm  RIGHT VENTRICLE RV Basal diam:  2.70 cm RV S prime:     10.10 cm/s TAPSE (M-mode): 2.8 cm LEFT ATRIUM             Index       RIGHT ATRIUM           Index LA  diam:        3.40 cm 1.76 cm/m  RA Area:     14.00 cm LA Vol (A2C):   36.3 ml 18.74 ml/m RA Volume:   33.80 ml  17.45 ml/m LA Vol (A4C):   46.4 ml 23.96 ml/m LA Biplane Vol: 43.3 ml 22.36 ml/m  AORTIC VALVE LVOT Vmax:   106.00 cm/s LVOT Vmean:  69.400 cm/s LVOT VTI:    0.238 m  AORTA Ao Root diam: 2.90 cm MITRAL VALVE MV Area (PHT): 3.03 cm    SHUNTS MV Decel Time: 250 msec    Systemic VTI:  0.24 m MV E velocity: 93.40 cm/s  Systemic Diam: 2.00 cm MV A velocity: 54.00 cm/s MV E/A ratio:  1.73 Buford Dresser MD Electronically signed by Buford Dresser MD Signature Date/Time: 01/01/2020/12:24:20 PM    Final    CT HEAD CODE STROKE WO CONTRAST  Result Date: 12/31/2019 CLINICAL DATA:  Code stroke. EXAM: CT HEAD WITHOUT CONTRAST TECHNIQUE: Contiguous axial images were obtained from the base of the skull through the vertex without intravenous contrast. COMPARISON:  None. FINDINGS: Brain: There is no acute intracranial hemorrhage, mass effect, or edema. Gray-white differentiation is preserved. Ventricles and sulci are normal in size and configuration. There is no extra-axial collection. Vascular: No hyperdense vessel. Skull: Unremarkable. Sinuses/Orbits: Minor mucosal thickening.  Orbits are unremarkable. Other: Mastoid air cells are clear. ASPECTS ([place] Stroke Program Early CT Score) - Ganglionic level infarction (caudate, lentiform nuclei, internal capsule, insula, M1-M3 cortex): 7 - Supraganglionic infarction (M4-M6 cortex): 3 Total score (0-10 with 10 being normal): 10 IMPRESSION: There is no acute intracranial hemorrhage or evidence of acute infarction. ASPECT score is 10. These results were communicated to  Dr. Leonel Ramsay at 3:43 pm on 12/31/2019 by text page via the [facility] messaging system. Electronically Signed   By: Macy Mis M.D.   On: 12/31/2019 15:49    Labs:  CBC: Recent Labs    12/31/19 1300 12/31/19 1304 01/01/20 0124 01/03/20 0244  WBC 7.9  --  8.1 6.0  HGB 13.6  13.6 12.5 13.1  HCT 40.2 40.0 37.5 38.0  PLT 263  --  201 261    COAGS: Recent Labs    12/31/19 1300  INR 1.0  APTT 33    BMP: Recent Labs    12/31/19 1300 12/31/19 1300 12/31/19 1304 01/01/20 0124 01/02/20 0034 01/03/20 0244  NA 137   < > 140 139 140 137  K 3.3*   < > 3.3* 3.4* 3.0* 4.0  CL 103   < > 105 109 107 107  CO2 24  --   --  23 23 21*  GLUCOSE 136*   < > 134* 118* 94 105*  BUN <5*   < > 4* <5* <5* 6  CALCIUM 9.3  --   --  8.4* 8.8* 9.7  CREATININE 0.71   < > 0.60 0.62 0.64 0.74  GFRNONAA >60  --   --  >60 >60 >60   < > = values in this interval not displayed.    LIVER FUNCTION TESTS: Recent Labs    12/31/19 1300 01/03/20 0244  BILITOT 0.6 0.9  AST 19 33  ALT 24 21  ALKPHOS 50 44  PROT 7.8 7.8  ALBUMIN 4.3 4.0    TUMOR MARKERS: No results for input(s): AFPTM, CEA, CA199, CHROMGRNA in the last 8760 hours.  Assessment and Plan: Focal parietal deficits Plan for image guided LP with sedation. Labs ok Risks and benefits of lumbar puncture was discussed with the patient and/or patient's family including, but not limited to bleeding, infection, post LP headache, damage to adjacent structures or low yield requiring additional tests.  All of the questions were answered and there is agreement to proceed.  Consent signed and in chart.  Thank you for this interesting consult.  I greatly enjoyed meeting Amber [name] and look forward to participating in their care.  A copy of this report was sent to the requesting provider on this date.  Electronically Signed: Ascencion Dike, PA-C 01/03/2020, 11:18 AM   I spent a total of 20 minutes in face to face in clinical consultation, greater than 50% of which was counseling/coordinating care for LP

## 2020-01-03 NOTE — Progress Notes (Signed)
Physical Therapy Discharge Patient Details Name: Amber [name] MRN: [id number] DOB: 03-17-1990 Today's Date: 01/03/2020 Time: 4037-5436 PT Time Calculation (min) (ACUTE ONLY): 18 min  Patient discharged from PT services secondary to goals met and no further PT needs identified.  Please see latest therapy progress note for current level of functioning and progress toward goals.    Progress and discharge plan discussed with patient and/or caregiver: Patient/Caregiver agrees with plan  GP     [place] 01/03/2020, 11:08 AM  [place] Pager [phone] Office [phone]

## 2020-01-04 ENCOUNTER — Inpatient Hospital Stay (HOSPITAL_COMMUNITY): Payer: Medicaid Other

## 2020-01-04 ENCOUNTER — Encounter (HOSPITAL_COMMUNITY): Payer: Self-pay | Admitting: Internal Medicine

## 2020-01-04 ENCOUNTER — Other Ambulatory Visit: Payer: Self-pay

## 2020-01-04 LAB — BLOOD CULTURE ID PANEL (REFLEXED) - BCID2

## 2020-01-04 LAB — HSV 1/2 PCR, CSF
HSV-1 DNA: NEGATIVE
HSV-2 DNA: NEGATIVE

## 2020-01-04 MED ORDER — PANTOPRAZOLE SODIUM 40 MG PO TBEC
40.0000 mg | DELAYED_RELEASE_TABLET | Freq: Every day | ORAL | Status: DC
  Administered 2020-01-04 – 2020-01-06 (×3): 40 mg via ORAL
  Filled 2020-01-04 (×3): qty 1

## 2020-01-04 MED ORDER — KETOROLAC TROMETHAMINE 15 MG/ML IJ SOLN
15.0000 mg | Freq: Once | INTRAMUSCULAR | Status: AC
  Administered 2020-01-04: 15 mg via INTRAVENOUS
  Filled 2020-01-04: qty 1

## 2020-01-04 MED ORDER — BUTALBITAL-APAP-CAFFEINE 50-325-40 MG PO TABS
2.0000 | ORAL_TABLET | Freq: Once | ORAL | Status: AC
  Administered 2020-01-04: 2 via ORAL
  Filled 2020-01-04: qty 2

## 2020-01-04 MED ORDER — PROMETHAZINE HCL 25 MG/ML IJ SOLN
12.5000 mg | Freq: Four times a day (QID) | INTRAMUSCULAR | Status: DC | PRN
  Administered 2020-01-04 – 2020-01-06 (×2): 12.5 mg via INTRAVENOUS
  Filled 2020-01-04 (×3): qty 1

## 2020-01-04 NOTE — Plan of Care (Signed)

## 2020-01-04 NOTE — Progress Notes (Signed)
LTM EEG hooked up and running - no initial skin breakdown - push button tested - neuro notified. [facility] notified and monitoring

## 2020-01-04 NOTE — Progress Notes (Signed)
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 In the ED was having severe right temporal headache 10 out of 10 multiple attempts to get CT were unsuccessful subsequently needing sedation, intubation and was managed in ICU seen by PCCM, neurology Blood culture urine cultures were ordered 10/26, patient was placed on empiric cefepime, vancomycin Forehead no evidence of acute abnormality chronic microhemorrhages, underwent EEG. Patient was extubated 10/27 Transferred to [facility] 10/28 Underwent repeat EEG 10/28   Assessment & Plan:  Transient neuro deficit in the setting of severe headache: TIA versus seizure or complicated migraine: Seen by neurology MRI brain negative for acute stroke, scattered supra and infratentorial microhemorrhages, underwent a stroke work-up with LDL 111, 2D echo-LVEF 60 to 31%, normal diastolic parameters normal right ventricular function-no other acute findings, hemoglobin A1c 5.5.  Undergoing extensive evaluation by neuro, had EEG which also does not support epileptiform activity.  Has elevated CRP and ESR.  Neurology tried to do LP but failed 3 times.  Subsequently she underwent LP by IR for cells, glucose, protein, ACE, crypto antigen, fungal smear and further testing.  All testing is still pending.  She had slightly elevated white blood cells but normal glucose and slightly elevated protein.  She has been started on acyclovir and cefepime by neurology.  Neuro recommended continuous EEG and repeat MRI but patient refused both of these.  Acute agitation unclear etiology concern for withdrawal but seems to be taking methadone.  Intubated in the ED for airway protection , off sedation, continue  medication as needed.  Currently stable.  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  Continue methadone and increase slowly at home dose 115 mg, noted to have prolonged QTC 569 so unable to use a full dose-pharmacy monitoring, continue dose per pharmacy monitor electrolytes.  Low-grade fever overnight 100.5: Last temperature on 01/01/2020.  Culture blood culture negative since 10/26.and chest x-ray 10/27 was clear.  Was placed on vanco/cefepime on admission and currently on cefepime.  Remains afebrile.  No leukocytosis.  Hypokalemia : Resolved.  Acute respiratory failure :needing intubation for agitation now extubated 10/27,   Thyroid nodule, left, will need ultrasound f/u as outpatient.  Possible seizure/migraine headache: She remains on Topamax.  Managed by neurology.  EEG results as below.  Nutrition: Diet Order            Diet Heart Room service appropriate? Yes; Fluid consistency: Thin  Diet effective now                  Body mass index is 30.67 kg/m.  DVT prophylaxis: SCDs Start: 12/31/19 1522 Code Status:   Code Status: Full Code  Family Communication: plan of care discussed with patient at bedside.  Status is: Inpatient  Remains inpatient appropriate because:Ongoing diagnostic testing needed not appropriate for outpatient work up and Inpatient level of care appropriate due to severity of illness   Dispo: The patient is from: Home              Anticipated d/c is to: TBD              Anticipated d/c date is: 3 days              Patient  currently is not medically stable to d/c.    Consultants:see note  Procedures:see note  EEG 10/27 : On propofol Versed phenobarbital.  This study is suggestive of profound diffuse encephalopathy, nonspecific etiology but likely related to sedation.  No seizures or epileptiform discharges were seen throughout the recording.  EEG 10/28:This studyshowed lateralized rhythmic delta activity in  right hemisphere ( LRDA) which is on the ictal-interictal continuum with low potential for seizures and can also be seen secondary to underlying structural abnormality, post-ictal state.  Additionally, there is evidence of moderate diffuse encephalopathy, nonspecific etiology. No definite seizures or epileptiform discharges were seen throughout the recording   Culture/Microbiology    Component Value Date/Time   SDES CSF 01/02/2020 [place] 01/02/2020 1208   CULT  01/02/2020 1208    NO GROWTH < 24 HOURS Performed at [facility] [address] [place], [place] [address]    REPTSTATUS PENDING 01/02/2020 1208    Other culture-see note  Medications: Scheduled Meds: . aspirin  325 mg Per Tube Daily   Or  . aspirin  300 mg Rectal Daily  . butalbital-acetaminophen-caffeine  2 tablet Oral Once  . chlorhexidine gluconate (MEDLINE KIT)  15 mL Mouth Rinse BID  . methadone  80 mg Oral Daily  . pantoprazole  40 mg Oral Daily  . topiramate  50 mg Oral BID   Continuous Infusions: . sodium chloride Stopped (01/02/20 0347)  . acyclovir 670 mg (01/04/20 1133)  . ceFEPime (MAXIPIME) IV 2 g (01/04/20 0831)    Antimicrobials: Anti-infectives (From admission, onward)   Start     Dose/Rate Route Frequency Ordered Stop   01/03/20 1945  acyclovir (ZOVIRAX) 670 mg in dextrose 5 % 100 mL IVPB        670 mg 113.4 mL/hr over 60 Minutes Intravenous Every 8 hours 01/03/20 1915     01/01/20 0200  ceFEPIme (MAXIPIME) 2 g in sodium chloride 0.9 % 100 mL IVPB        2 g 200 mL/hr over 30 Minutes Intravenous Every 8 hours 12/31/19 1646     12/31/19 1700  vancomycin (VANCOREADY) IVPB 2000 mg/400 mL        2,000 mg 200 mL/hr over 120 Minutes Intravenous  Once 12/31/19 1626 12/31/19 1921   12/31/19 1700  ceFEPIme (MAXIPIME) 2 g in sodium chloride 0.9 % 100 mL IVPB        2 g 200 mL/hr over 30 Minutes Intravenous STAT 12/31/19 1646 12/31/19 1758    Subjective Patient seen and  examined early this morning.  She was sleepy because she had received her methadone and Topamax.  I then received a message from primary RN later in the morning that patient wanted to leave [place].  She was given the papers however she kept writing the year multiple times on the same paper and was not able to put on her clots which raised the concern from nursing staff that patient might not have the capacity to sign those papers.  I personally went to see patient again.  Patient was standing in the room.  She was already informed by nursing staff how dangerous it could be for her to leave as she possibly has infection for which she needs IV medications and needs to stay in the hospital but despite of that she was adamant on leaving [place].  I had asked her several questions to detect her orientation and she was fully alert and oriented.  She was able to answer all the questions regarding her family, her daughter, tomorrow is Halloween, why she came to the hospital and so forth.  She also remembered the names and phone numbers of her family members.  She kept repeating " I understand and I will come back if I do not do well as I came to the hospital few days ago".  Despite of all efforts, patient remained adamant on leaving however she was very calm, polite and respectful and her communication.  In my professional assessment, she does have the capacity to make decisions for herself although she was having some problem with balance and obviously she is not making the right decision.  Nursing continue to have concerns about her capacity so I called Dr. Leonel Ramsay who arrived at the bedside shortly and assess the patient and he also asked the same questions from the patient and also asked her to repeat what he had said to her this morning.  Patient was able to answer all questions.  Per Dr. Leonel Ramsay also, patient has the capacity to make decisions for herself.  Subsequently, patient  is spoke to her mother and her mother was able to convince her to stay.  So far, she is staying overnight however patient is a high risk for leaving [place] and holds the capacity to do so.  She has been fully informed that she is not medically cleared and we have all recommended her to stay in the hospital to complete work-up and possibly complete treatment.   Objective: Vitals: Today's Vitals   01/04/20 0020 01/04/20 0503 01/04/20 0653 01/04/20 0753  BP: 111/70 111/74  129/76  Pulse: 66   65  Resp: $Remo'20 20  18  'ncOFG$ Temp: 97.8 F (36.6 C) 98.4 F (36.9 C)  97.9 F (36.6 C)  TempSrc: Oral Oral  Oral  SpO2: 96% 98%  99%  Weight:      Height:      PainSc:   Asleep     Intake/Output Summary (Last 24 hours) at 01/04/2020 1349 Last data filed at 01/03/2020 2359 Gross per 24 hour  Intake 220 ml  Output --  Net 220 ml   Filed Weights   12/31/19 1200 01/01/20 0426 01/03/20 0538  Weight: 84.4 kg 86.3 kg 83.6 kg   Weight change:   Intake/Output from previous day: 10/29 0701 - 10/30 0700 In: 220 [P.O.:120; IV Piggyback:100] Out: -  Intake/Output this shift: No intake/output data recorded.  Examination:  General exam: Appears calm and comfortable  Respiratory system: Clear to auscultation. Respiratory effort normal. Cardiovascular system: S1 & S2 heard, RRR. No JVD, murmurs, rubs, gallops or clicks. No pedal edema. Gastrointestinal system: Abdomen is nondistended, soft and nontender. No organomegaly or masses felt. Normal bowel sounds heard. Central nervous system: Alert and oriented. No focal neurological deficits. Extremities: Symmetric 5 x 5 power. Skin: No rashes, lesions or ulcers.  Psychiatry: Judgement and insight appear poor. Mood & affect appropriate.   Data Reviewed: I have personally reviewed following labs and imaging studies CBC: Recent Labs  Lab 12/31/19 1300 12/31/19 1304 01/01/20 0124 01/03/20 0244  WBC 7.9  --  8.1 6.0  NEUTROABS 5.6  --   --   3.2  HGB 13.6 13.6 12.5 13.1  HCT 40.2 40.0 37.5 38.0  MCV 96.6  --  96.6 94.8  PLT 263  --  201 073   Basic Metabolic Panel: Recent Labs  Lab 12/31/19 1300 12/31/19 1304 01/01/20 0124 01/02/20 0034 01/03/20 0244  NA 137 140 139 140 137  K 3.3* 3.3* 3.4* 3.0* 4.0  CL 103 105 109 107 107  CO2 24  --  23 23 21*  GLUCOSE 136* 134* 118* 94 105*  BUN <5* 4* <5* <5* 6  CREATININE 0.71 0.60 0.62 0.64 0.74  CALCIUM 9.3  --  8.4* 8.8* 9.7  MG  --   --  1.8 1.8 2.0  PHOS  --   --  3.2  --   --    GFR: Estimated Creatinine Clearance: 108.7 mL/min (by C-G formula based on SCr of 0.74 mg/dL). Liver Function Tests: Recent Labs  Lab 12/31/19 1300 01/03/20 0244  AST 19 33  ALT 24 21  ALKPHOS 50 44  BILITOT 0.6 0.9  PROT 7.8 7.8  ALBUMIN 4.3 4.0   No results for input(s): LIPASE, AMYLASE in the last 168 hours. No results for input(s): AMMONIA in the last 168 hours. Coagulation Profile: Recent Labs  Lab 12/31/19 1300  INR 1.0   Cardiac Enzymes: No results for input(s): CKTOTAL, CKMB, CKMBINDEX, TROPONINI in the last 168 hours. BNP (last 3 results) No results for input(s): PROBNP in the last 8760 hours. HbA1C: No results for input(s): HGBA1C in the last 72 hours. CBG: Recent Labs  Lab 12/31/19 1258 12/31/19 2316  GLUCAP 137* 137*   Lipid Profile: Recent Labs    01/03/20 1916  TRIG 109   Thyroid Function Tests: No results for input(s): TSH, T4TOTAL, FREET4, T3FREE, THYROIDAB in the last 72 hours. Anemia Panel: No results for input(s): VITAMINB12, FOLATE, FERRITIN, TIBC, IRON, RETICCTPCT in the last 72 hours. Sepsis Labs: Recent Labs  Lab 12/31/19 1615 12/31/19 2000 01/01/20 0124 01/02/20 0034  PROCALCITON <0.10  --  <0.10 <0.10  LATICACIDVEN 1.5 0.7  --   --     Recent Results (from the past 240 hour(s))  Respiratory Panel by RT PCR (Flu A&B, Covid) - Nasopharyngeal Swab     Status: None   Collection Time: 12/31/19  3:25 PM   Specimen: Nasopharyngeal  Swab  Result Value Ref Range Status   SARS Coronavirus 2 by RT PCR NEGATIVE NEGATIVE Final    Comment: (NOTE) SARS-CoV-2 target nucleic acids are NOT DETECTED.  The SARS-CoV-2 RNA is generally detectable in upper respiratoy specimens during the acute phase of infection. The lowest concentration of SARS-CoV-2 viral copies this assay can detect is 131 copies/mL. A negative result does not preclude SARS-Cov-2 infection and should not be used as the sole basis for treatment or other patient management decisions. A negative result may occur with  improper specimen collection/handling, submission of specimen other than nasopharyngeal swab, presence of viral mutation(s) within the areas targeted by this assay, and inadequate number of viral copies (<131 copies/mL). A negative result must be combined with clinical observations, patient history, and epidemiological information. The expected result is Negative.  Fact Sheet for Patients:  PinkCheek.be  Fact Sheet for Healthcare Providers:  GravelBags.it  This test is no t yet approved or cleared by the [place] FDA and  has been authorized for detection and/or diagnosis of SARS-CoV-2 by FDA under an Emergency Use Authorization (EUA). This EUA will remain  in effect (meaning this test can be used) for the duration of the COVID-19 declaration under Section 564(b)(1) of the Act, 21 U.S.C. section 360bbb-3(b)(1), unless the authorization is terminated or revoked sooner.     Influenza A by PCR NEGATIVE NEGATIVE Final   Influenza B by PCR NEGATIVE NEGATIVE Final    Comment: (  NOTE) The Xpert Xpress SARS-CoV-2/FLU/RSV assay is intended as an aid in  the diagnosis of influenza from Nasopharyngeal swab specimens and  should not be used as a sole basis for treatment. Nasal washings and  aspirates are unacceptable for Xpert Xpress SARS-CoV-2/FLU/RSV  testing.  Fact Sheet for  Patients: PinkCheek.be  Fact Sheet for Healthcare Providers: GravelBags.it  This test is not yet approved or cleared by the [place] FDA and  has been authorized for detection and/or diagnosis of SARS-CoV-2 by  FDA under an Emergency Use Authorization (EUA). This EUA will remain  in effect (meaning this test can be used) for the duration of the  Covid-19 declaration under Section 564(b)(1) of the Act, 21  U.S.C. section 360bbb-3(b)(1), unless the authorization is  terminated or revoked. Performed at [facility], [place] [address], [place], [place] [address]   Culture, blood (routine x 2)     Status: None (Preliminary result)   Collection Time: 12/31/19  4:15 PM   Specimen: BLOOD  Result Value Ref Range Status   Specimen Description BLOOD SITE NOT SPECIFIED  Final   Special Requests   Final    BOTTLES DRAWN AEROBIC AND ANAEROBIC Blood Culture adequate volume   Culture  Setup Time   Final    GRAM POSITIVE RODS ANAEROBIC BOTTLE ONLY CRITICAL RESULT CALLED TO, READ BACK BY AND VERIFIED WITH: PHARMD M. PHAN 1302 [id number] FCP Organism ID to follow Performed at [facility], [place] [address] [place], [place] [address]    Culture GRAM POSITIVE RODS  Final   Report Status PENDING  Incomplete  Culture, blood (routine x 2)     Status: None (Preliminary result)   Collection Time: 12/31/19  4:36 PM   Specimen: BLOOD  Result Value Ref Range Status   Specimen Description BLOOD SITE NOT SPECIFIED  Final   Special Requests   Final    BOTTLES DRAWN AEROBIC ONLY Blood Culture results may not be optimal due to an inadequate volume of blood received in culture bottles   Culture   Final    NO GROWTH 4 DAYS Performed at [facility], [place] [address] [place], [place] [address]    Report Status PENDING  Incomplete  MRSA PCR Screening     Status: None   Collection Time: 12/31/19  7:13 PM   Specimen: Nasal Mucosa;  Nasopharyngeal  Result Value Ref Range Status   MRSA by PCR NEGATIVE NEGATIVE Final    Comment:        The GeneXpert MRSA Assay (FDA approved for NASAL specimens only), is one component of a comprehensive MRSA colonization surveillance program. It is not intended to diagnose MRSA infection nor to guide or monitor treatment for MRSA infections. Performed at [facility], [place] [address] [place], [place] [address]   Urine culture     Status: None   Collection Time: 12/31/19  8:28 PM   Specimen: Urine, Random  Result Value Ref Range Status   Specimen Description URINE, RANDOM  Final   Special Requests NONE  Final   Culture   Final    NO GROWTH Performed at [facility], [place] [address] [place], [place] [address]    Report Status 01/02/2020 FINAL  Final  CSF culture     Status: None (Preliminary result)   Collection Time: 01/02/20 12:08 PM   Specimen: CSF; Cerebrospinal Fluid  Result Value Ref Range Status   Specimen Description CSF  Final  Special Requests NONE  Final   Gram Stain   Final    WBC PRESENT, PREDOMINANTLY MONONUCLEAR RED BLOOD CELLS PRESENT NO ORGANISMS SEEN    Culture   Final    NO GROWTH < 24 HOURS Performed at [facility] [address], [place], [place] [address]    Report Status PENDING  Incomplete     Radiology Studies: IR Fluoro Guide Ndl Plmt / BX  Result Date: 01/03/2020 CLINICAL DATA:  29 year old female referred for lumbar puncture EXAM: DIAGNOSTIC LUMBAR PUNCTURE UNDER FLUOROSCOPIC GUIDANCE FLUOROSCOPY TIME:  Fluoroscopy Time:  0 minutes 12 seconds MEDICATIONS: 0.5 mg Versed, 50 mcg fentanyl. 15 minutes sedation time for moderate sedation The patient was monitored during the procedure by the interventional radiology nursing staff and staff position PROCEDURE: Informed consent was obtained from the patient prior to the procedure, including potential complications of headache, allergy, and pain. With the patient prone, the lower  back was prepped with Betadine. 1% Lidocaine was used for local anesthesia. Lumbar puncture was performed at the L4-L5 level using a 20 gauge needle with return of clear CSF with an opening pressure of 12 cm water. Ten ml of CSF were obtained for laboratory studies. The patient tolerated the procedure well and there were no apparent complications. IMPRESSION: Fluoro guided LP. Electronically Signed   By: Corrie Mckusick D.O.   On: 01/03/2020 17:10     LOS: 4 days   Darliss Cheney, MD [facility]  01/04/2020, 1:49 PM

## 2020-01-04 NOTE — Progress Notes (Addendum)
New order received for Fioricet and for shower privellege as per pt request     Had called pt's mother to update her on what is going on with and her wanting to leave the [facility] and that Dr. Doristine Bosworth and Leonel Ramsay , Charge RN Colletta [place] and this RN  Had extensively discussed to her risks and possible outcome if she does leave AMA. Pt mother had spoken with daughter and her daughter/pt had decided she would stay  . Dr. Doristine Bosworth  Updated. Dr. Leonel Ramsay updated via Shea Evans and also with request for additional Topamax medication for her headache.   Pt has states " I want to sign myself out, I understand the risks of not being treated ". Dr. Doristine Bosworth updated of her signing out. MD is aware and per conversation, pt has the capacity to decide for herself. Charge RN aware of AMA sing off by pt. This RN had attempted to convince pt to stay to be treated but to no avail. Pt still wanted to go home against Medical Advice.

## 2020-01-04 NOTE — Progress Notes (Addendum)
Subjective: Patient has repeatedly requested to leave each day, however previously I was able to talk her down each day.   Today, however, she was insistent on leaving [place].  Exam: Vitals:   01/04/20 0503 01/04/20 0753  BP: 111/74 129/76  Pulse:  65  Resp: 20 18  Temp: 98.4 F (36.9 C) 97.9 F (36.6 C)  SpO2: 98% 99%   Gen: In bed, NAD Resp: non-labored breathing, no acute distress Abd: soft, nt  Neuro: MS: Awake, alert, she does have mild sensory neglect today CN: Pupils equal round and reactive, she crosses midline in both directions, visual fields are full Motor: She moves both sides relatively equally today, but with 4+/5 weakness on the left.  Sensory: She endorses symmetric sensation, but does extinguish to double simultaneous stimulation   Pertinent Labs: Cr 0.74 PCT < 0.1 UDS + for opiates and benzos(both given in ED) Blood culture negative x 2 days CSF: WBC 14, 97% lymphs RBC 73 Protein 60 Glucose 66 ESR, CRP -elevated, though in the setting of recent intubation low-grade fever, of unclear significance  Impression: 29 year old female with focal right parietal deficits of unclear etiology.  Her initial CT perfusion scan did suggest hypoperfusion in this area which originally made me suspect that this was ischemia, but with persistent negative MRI as well as persistent symptoms, I think that stroke is very unlikely.  With headaches, I do think that CSF sampling is needed.  One other cause of hypoperfusion is post ictal state, and if she is intermittently having seizures, this could explain her symptoms.  I have again recommended continuous EEG tomorrow.  She does have a pleocytosis, at 14, unclear if this represents inflammation versus some type of infectious process.  I started acyclovir and I would favor continuing this pending HSV PCR.  Of note, while the patient was threatening to leave AMA, both myself and Dr. Doristine Bosworth responded and evaluated  her.  She does still have a very mild persistent neglect, but she was able to clearly state what was going on with her, including possibility of a brain infection, the possibility of persistent brain damage if she relief, the possibility of death without treatment.  She seemed to understand these risks, and therefore I do not think that if she did choose to leave AMA we could hold her against her will, though I feel it would be very much in her best interest to stay.  Recommendations: 1) continue acyclovir 2) neurology will continue to follow  Roland Rack, MD [facility] [phone]  If 7pm- 7am, please page neurology on call as listed in [place].

## 2020-01-04 NOTE — Progress Notes (Signed)
Lab called to update blood culture result of 1/3 bottles with GPR. Normally, this is likely a contaminant but she is being r/o for meningitis so ask lab to run [place]. FYI messaged Dr. Doristine Bosworth.  Onnie Boer, PharmD, BCIDP, AAHIVP, CPP Infectious Disease Pharmacist 01/04/2020 1:17 PM

## 2020-01-05 ENCOUNTER — Encounter (HOSPITAL_COMMUNITY): Payer: Self-pay | Admitting: Internal Medicine

## 2020-01-05 LAB — BASIC METABOLIC PANEL
Anion gap: 11 (ref 5–15)
BUN: 9 mg/dL (ref 6–20)
CO2: 21 mmol/L — ABNORMAL LOW (ref 22–32)
Calcium: 10 mg/dL (ref 8.9–10.3)
Chloride: 106 mmol/L (ref 98–111)
Creatinine, Ser: 0.81 mg/dL (ref 0.44–1.00)
GFR, Estimated: >60 mL/min (ref >60)
Glucose, Bld: 98 mg/dL (ref 70–99)
Potassium: 3.3 mmol/L — ABNORMAL LOW (ref 3.5–5.1)
Sodium: 138 mmol/L (ref 135–145)

## 2020-01-05 LAB — CBC WITH DIFFERENTIAL/PLATELET
Abs Immature Granulocytes: 0.02 10*3/uL (ref 0.00–0.07)
Basophils Absolute: 0 10*3/uL (ref 0.0–0.1)
Basophils Relative: 1 %
Eosinophils Absolute: 0.3 10*3/uL (ref 0.0–0.5)
Eosinophils Relative: 4 %
HCT: 43.3 % (ref 36.0–46.0)
Hemoglobin: 14.8 g/dL (ref 12.0–15.0)
Immature Granulocytes: 0 %
Lymphocytes Relative: 47 %
Lymphs Abs: 2.6 10*3/uL (ref 0.7–4.0)
MCH: 32.6 pg (ref 26.0–34.0)
MCHC: 34.2 g/dL (ref 30.0–36.0)
MCV: 95.4 fL (ref 80.0–100.0)
Monocytes Absolute: 0.4 10*3/uL (ref 0.1–1.0)
Monocytes Relative: 7 %
Neutro Abs: 2.3 10*3/uL (ref 1.7–7.7)
Neutrophils Relative %: 41 %
Platelets: 306 10*3/uL (ref 150–400)
RBC: 4.54 MIL/uL (ref 3.87–5.11)
RDW: 12.3 % (ref 11.5–15.5)
WBC: 5.6 10*3/uL (ref 4.0–10.5)
nRBC: 0 % (ref 0.0–0.2)

## 2020-01-05 LAB — CULTURE, BLOOD (ROUTINE X 2): Culture: NO GROWTH

## 2020-01-05 MED ORDER — DIVALPROEX SODIUM 250 MG PO DR TAB
500.0000 mg | DELAYED_RELEASE_TABLET | Freq: Three times a day (TID) | ORAL | Status: DC
  Administered 2020-01-05 – 2020-01-07 (×6): 500 mg via ORAL
  Filled 2020-01-05 (×6): qty 2

## 2020-01-05 MED ORDER — POTASSIUM CHLORIDE CRYS ER 20 MEQ PO TBCR
40.0000 meq | EXTENDED_RELEASE_TABLET | Freq: Once | ORAL | Status: AC
  Administered 2020-01-05: 40 meq via ORAL
  Filled 2020-01-05: qty 2

## 2020-01-05 MED ORDER — VALPROATE SODIUM 500 MG/5ML IV SOLN
3000.0000 mg | Freq: Once | INTRAVENOUS | Status: AC
  Administered 2020-01-05: 3000 mg via INTRAVENOUS
  Filled 2020-01-05: qty 30

## 2020-01-05 NOTE — Progress Notes (Signed)
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 In the ED was having severe right temporal headache 10 out of 10 multiple attempts to get CT were unsuccessful subsequently needing sedation, intubation and was managed in ICU seen by PCCM, neurology Blood culture urine cultures were ordered 10/26, patient was placed on empiric cefepime, vancomycin Forehead no evidence of acute abnormality chronic microhemorrhages, underwent EEG. Patient was extubated 10/27 Transferred to [facility] 10/28 Underwent repeat EEG 10/28   Assessment & Plan:  Transient neuro deficit in the setting of severe headache: TIA versus seizure or complicated migraine: Seen by neurology MRI brain negative for acute stroke, scattered supra and infratentorial microhemorrhages, underwent a stroke work-up with LDL 111, 2D echo-LVEF 60 to 87%, normal diastolic parameters normal right ventricular function-no other acute findings, hemoglobin A1c 5.5.  Undergoing extensive evaluation by neuro, had EEG which also does not support epileptiform activity.  Has elevated CRP and ESR.  Neurology tried to do LP but failed 3 EEG going on..  Subsequently she underwent LP by IR for cells, glucose, protein, ACE, crypto antigen, fungal smear and further testing.  All testing is still pending.  She had slightly elevated white blood cells but normal glucose and slightly elevated protein.  She has been started on acyclovir and cefepime by neurology.  Neuro recommended continuous EEG and repeat MRI but patient refused both of these and finally she agreed and she is currently getting continuous EEG.  Acute agitation unclear etiology concern for withdrawal but seems to be taking  methadone.  Intubated in the ED for airway protection , off sedation, continue medication as needed.  Currently stable.  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  Continue methadone and increase slowly at home dose 115 mg, noted to have prolonged QTC 569 so unable to use a full dose-pharmacy monitoring, continue dose per pharmacy monitor electrolytes.  Low-grade fever overnight 100.5: Last temperature on 01/01/2020.  Culture blood culture negative since 10/26.and chest x-ray 10/27 was clear.  Was placed on vanco/cefepime on admission and currently on cefepime.  Remains afebrile.  No leukocytosis.  Hypokalemia : 3.3 today.  Will replace orally.  Acute respiratory failure :needing intubation for agitation now extubated 10/27,   Thyroid nodule, left, will need ultrasound f/u as outpatient.  Possible seizure/migraine headache: She remains on Topamax.  Managed by neurology.  EEG results as below.  Continuous EEG going on right now.  Management per neurology.  Nutrition: Diet Order            Diet Heart Room service appropriate? Yes; Fluid consistency: Thin  Diet effective now                  Body mass index is 30.67 kg/m.  DVT prophylaxis: SCDs Start: 12/31/19 1522 Code Status:   Code Status: Full Code  Family Communication: plan of care discussed with patient at bedside.  Status is: Inpatient  Remains inpatient appropriate because:Ongoing diagnostic testing needed not appropriate for outpatient work up and Inpatient level of care appropriate due to severity of illness   Dispo: The patient is from: Home              Anticipated d/c is to: TBD  Anticipated d/c date is: 3 days              Patient currently is not medically stable to d/c.    Consultants:see note  Procedures:see note  EEG 10/27 : On propofol Versed phenobarbital.  This study is suggestive of profound diffuse encephalopathy, nonspecific etiology but likely  related to sedation.  No seizures or epileptiform discharges were seen throughout the recording.  EEG 10/28:This studyshowed lateralized rhythmic delta activity in right hemisphere ( LRDA) which is on the ictal-interictal continuum with low potential for seizures and can also be seen secondary to underlying structural abnormality, post-ictal state.  Additionally, there is evidence of moderate diffuse encephalopathy, nonspecific etiology. No definite seizures or epileptiform discharges were seen throughout the recording   Culture/Microbiology    Component Value Date/Time   SDES CSF 01/02/2020 [place] 01/02/2020 1208   CULT  01/02/2020 1208    NO GROWTH 2 DAYS Performed at [facility], [place] [address], [place], [place] [address]    REPTSTATUS PENDING 01/02/2020 1208    Other culture-see note  Medications: Scheduled Meds: . aspirin  325 mg Per Tube Daily   Or  . aspirin  300 mg Rectal Daily  . chlorhexidine gluconate (MEDLINE KIT)  15 mL Mouth Rinse BID  . divalproex  500 mg Oral Q8H  . methadone  80 mg Oral Daily  . pantoprazole  40 mg Oral Daily  . topiramate  50 mg Oral BID   Continuous Infusions: . sodium chloride Stopped (01/02/20 0347)  . ceFEPime (MAXIPIME) IV 2 g (01/05/20 0929)  . valproate sodium      Antimicrobials: Anti-infectives (From admission, onward)   Start     Dose/Rate Route Frequency Ordered Stop   01/03/20 1945  acyclovir (ZOVIRAX) 670 mg in dextrose 5 % 100 mL IVPB  Status:  Discontinued        670 mg 113.4 mL/hr over 60 Minutes Intravenous Every 8 hours 01/03/20 1915 01/05/20 1109   01/01/20 0200  ceFEPIme (MAXIPIME) 2 g in sodium chloride 0.9 % 100 mL IVPB        2 g 200 mL/hr over 30 Minutes Intravenous Every 8 hours 12/31/19 1646     12/31/19 1700  vancomycin (VANCOREADY) IVPB 2000 mg/400 mL        2,000 mg 200 mL/hr over 120 Minutes Intravenous  Once 12/31/19 1626 12/31/19 1921   12/31/19 1700  ceFEPIme (MAXIPIME) 2 g in  sodium chloride 0.9 % 100 mL IVPB        2 g 200 mL/hr over 30 Minutes Intravenous STAT 12/31/19 1646 12/31/19 1758    Subjective Seen and examined.  She looks brighter and more alert today.  She has no complaint.  Headache has improved as well.  Although she decided to stay overnight yesterday but she remains high flight risk.  Once again she asked me today if she needs to stay in [place].  She was once again explained why she needs to stay in the hospital and how it could be done just if she left AMA.  Despite of this, I think patient is running high risk for leaving AGAINST MEDICAL ADVICE anytime.  She has the capacity to make decisions for herself.  Objective: Vitals: Today's Vitals   01/05/20 0022 01/05/20 0433 01/05/20 0900 01/05/20 1044  BP: 111/74 (!) 109/56 115/60   Pulse: 85 82 82   Resp: $Remo'18 18 18   'tgKLJ$ Temp: (!) 97.3 F (36.3 C) 97.7  F (36.5 C) 97.6 F (36.4 C)   TempSrc: Oral Oral Oral   SpO2: 100% 100%    Weight:      Height:      PainSc:  $Remove'6  8  3    'FqZGMoC$ No intake or output data in the 24 hours ending 01/05/20 1233 Filed Weights   12/31/19 1200 01/01/20 0426 01/03/20 0538  Weight: 84.4 kg 86.3 kg 83.6 kg   Weight change:   Intake/Output from previous day: No intake/output data recorded. Intake/Output this shift: No intake/output data recorded.  Examination:  General exam: Appears calm and comfortable  Respiratory system: Clear to auscultation. Respiratory effort normal. Cardiovascular system: S1 & S2 heard, RRR. No JVD, murmurs, rubs, gallops or clicks. No pedal edema. Gastrointestinal system: Abdomen is nondistended, soft and nontender. No organomegaly or masses felt. Normal bowel sounds heard. Central nervous system: Alert and oriented. No focal neurological deficits. Extremities: Symmetric 5 x 5 power. Skin: No rashes, lesions or ulcers.  Psychiatry: Judgement and insight appear normal. Mood & affect appropriate.   Data Reviewed: I have personally reviewed  following labs and imaging studies CBC: Recent Labs  Lab 12/31/19 1300 12/31/19 1304 01/01/20 0124 01/03/20 0244 01/05/20 0418  WBC 7.9  --  8.1 6.0 5.6  NEUTROABS 5.6  --   --  3.2 2.3  HGB 13.6 13.6 12.5 13.1 14.8  HCT 40.2 40.0 37.5 38.0 43.3  MCV 96.6  --  96.6 94.8 95.4  PLT 263  --  201 261 941   Basic Metabolic Panel: Recent Labs  Lab 12/31/19 1300 12/31/19 1300 12/31/19 1304 01/01/20 0124 01/02/20 0034 01/03/20 0244 01/05/20 0418  NA 137   < > 140 139 140 137 138  K 3.3*   < > 3.3* 3.4* 3.0* 4.0 3.3*  CL 103   < > 105 109 107 107 106  CO2 24  --   --  23 23 21* 21*  GLUCOSE 136*   < > 134* 118* 94 105* 98  BUN <5*   < > 4* <5* <5* 6 9  CREATININE 0.71   < > 0.60 0.62 0.64 0.74 0.81  CALCIUM 9.3  --   --  8.4* 8.8* 9.7 10.0  MG  --   --   --  1.8 1.8 2.0  --   PHOS  --   --   --  3.2  --   --   --    < > = values in this interval not displayed.   GFR: Estimated Creatinine Clearance: 107.4 mL/min (by C-G formula based on SCr of 0.81 mg/dL). Liver Function Tests: Recent Labs  Lab 12/31/19 1300 01/03/20 0244  AST 19 33  ALT 24 21  ALKPHOS 50 44  BILITOT 0.6 0.9  PROT 7.8 7.8  ALBUMIN 4.3 4.0   No results for input(s): LIPASE, AMYLASE in the last 168 hours. No results for input(s): AMMONIA in the last 168 hours. Coagulation Profile: Recent Labs  Lab 12/31/19 1300  INR 1.0   Cardiac Enzymes: No results for input(s): CKTOTAL, CKMB, CKMBINDEX, TROPONINI in the last 168 hours. BNP (last 3 results) No results for input(s): PROBNP in the last 8760 hours. HbA1C: No results for input(s): HGBA1C in the last 72 hours. CBG: Recent Labs  Lab 12/31/19 1258 12/31/19 2316  GLUCAP 137* 137*   Lipid Profile: Recent Labs    01/03/20 1916  TRIG 109   Thyroid Function Tests: No results for input(s): TSH, T4TOTAL, FREET4, T3FREE, THYROIDAB in  the last 72 hours. Anemia Panel: No results for input(s): VITAMINB12, FOLATE, FERRITIN, TIBC, IRON, RETICCTPCT  in the last 72 hours. Sepsis Labs: Recent Labs  Lab 12/31/19 1615 12/31/19 2000 01/01/20 0124 01/02/20 0034  PROCALCITON <0.10  --  <0.10 <0.10  LATICACIDVEN 1.5 0.7  --   --     Recent Results (from the past 240 hour(s))  Respiratory Panel by RT PCR (Flu A&B, Covid) - Nasopharyngeal Swab     Status: None   Collection Time: 12/31/19  3:25 PM   Specimen: Nasopharyngeal Swab  Result Value Ref Range Status   SARS Coronavirus 2 by RT PCR NEGATIVE NEGATIVE Final    Comment: (NOTE) SARS-CoV-2 target nucleic acids are NOT DETECTED.  The SARS-CoV-2 RNA is generally detectable in upper respiratoy specimens during the acute phase of infection. The lowest concentration of SARS-CoV-2 viral copies this assay can detect is 131 copies/mL. A negative result does not preclude SARS-Cov-2 infection and should not be used as the sole basis for treatment or other patient management decisions. A negative result may occur with  improper specimen collection/handling, submission of specimen other than nasopharyngeal swab, presence of viral mutation(s) within the areas targeted by this assay, and inadequate number of viral copies (<131 copies/mL). A negative result must be combined with clinical observations, patient history, and epidemiological information. The expected result is Negative.  Fact Sheet for Patients:  PinkCheek.be  Fact Sheet for Healthcare Providers:  GravelBags.it  This test is no t yet approved or cleared by the [place] FDA and  has been authorized for detection and/or diagnosis of SARS-CoV-2 by FDA under an Emergency Use Authorization (EUA). This EUA will remain  in effect (meaning this test can be used) for the duration of the COVID-19 declaration under Section 564(b)(1) of the Act, 21 U.S.C. section 360bbb-3(b)(1), unless the authorization is terminated or revoked sooner.     Influenza A by PCR NEGATIVE  NEGATIVE Final   Influenza B by PCR NEGATIVE NEGATIVE Final    Comment: (NOTE) The Xpert Xpress SARS-CoV-2/FLU/RSV assay is intended as an aid in  the diagnosis of influenza from Nasopharyngeal swab specimens and  should not be used as a sole basis for treatment. Nasal washings and  aspirates are unacceptable for Xpert Xpress SARS-CoV-2/FLU/RSV  testing.  Fact Sheet for Patients: PinkCheek.be  Fact Sheet for Healthcare Providers: GravelBags.it  This test is not yet approved or cleared by the [place] FDA and  has been authorized for detection and/or diagnosis of SARS-CoV-2 by  FDA under an Emergency Use Authorization (EUA). This EUA will remain  in effect (meaning this test can be used) for the duration of the  Covid-19 declaration under Section 564(b)(1) of the Act, 21  U.S.C. section 360bbb-3(b)(1), unless the authorization is  terminated or revoked. Performed at [facility], [place] [address] [place], [place] [address]   Culture, blood (routine x 2)     Status: None (Preliminary result)   Collection Time: 12/31/19  4:15 PM   Specimen: BLOOD  Result Value Ref Range Status   Specimen Description BLOOD SITE NOT SPECIFIED  Final   Special Requests   Final    BOTTLES DRAWN AEROBIC AND ANAEROBIC Blood Culture adequate volume Performed at [facility], [place] [address], [place], [place] [address]    Culture  Setup Time   Final    GRAM POSITIVE RODS ANAEROBIC BOTTLE ONLY CRITICAL RESULT CALLED TO, READ BACK BY AND VERIFIED WITH: PHARMD M. PHAN 1302  [id number] FCP    Culture GRAM POSITIVE RODS  Final   Report Status PENDING  Incomplete  Blood Culture ID Panel (Reflexed)     Status: None   Collection Time: 12/31/19  4:15 PM  Result Value Ref Range Status   Enterococcus faecalis NOT DETECTED NOT DETECTED Final   Enterococcus Faecium NOT DETECTED NOT DETECTED Final   Listeria monocytogenes NOT DETECTED NOT  DETECTED Final   Staphylococcus species NOT DETECTED NOT DETECTED Final   Staphylococcus aureus (BCID) NOT DETECTED NOT DETECTED Final   Staphylococcus epidermidis NOT DETECTED NOT DETECTED Final   Staphylococcus lugdunensis NOT DETECTED NOT DETECTED Final   Streptococcus species NOT DETECTED NOT DETECTED Final   Streptococcus agalactiae NOT DETECTED NOT DETECTED Final   Streptococcus pneumoniae NOT DETECTED NOT DETECTED Final   Streptococcus pyogenes NOT DETECTED NOT DETECTED Final   A.calcoaceticus-baumannii NOT DETECTED NOT DETECTED Final   Bacteroides fragilis NOT DETECTED NOT DETECTED Final   Enterobacterales NOT DETECTED NOT DETECTED Final   Enterobacter cloacae complex NOT DETECTED NOT DETECTED Final   Escherichia coli NOT DETECTED NOT DETECTED Final   Klebsiella aerogenes NOT DETECTED NOT DETECTED Final   Klebsiella oxytoca NOT DETECTED NOT DETECTED Final   Klebsiella pneumoniae NOT DETECTED NOT DETECTED Final   Proteus species NOT DETECTED NOT DETECTED Final   Salmonella species NOT DETECTED NOT DETECTED Final   Serratia marcescens NOT DETECTED NOT DETECTED Final   Haemophilus influenzae NOT DETECTED NOT DETECTED Final   Neisseria meningitidis NOT DETECTED NOT DETECTED Final   Pseudomonas aeruginosa NOT DETECTED NOT DETECTED Final   Stenotrophomonas maltophilia NOT DETECTED NOT DETECTED Final   Candida albicans NOT DETECTED NOT DETECTED Final   Candida auris NOT DETECTED NOT DETECTED Final   Candida glabrata NOT DETECTED NOT DETECTED Final   Candida krusei NOT DETECTED NOT DETECTED Final   Candida parapsilosis NOT DETECTED NOT DETECTED Final   Candida tropicalis NOT DETECTED NOT DETECTED Final   Cryptococcus neoformans/gattii NOT DETECTED NOT DETECTED Final    Comment: Performed at [facility], [address], [place], [place] [address]  Culture, blood (routine x 2)     Status: None   Collection Time: 12/31/19  4:36 PM   Specimen: BLOOD  Result Value Ref Range  Status   Specimen Description BLOOD SITE NOT SPECIFIED  Final   Special Requests   Final    BOTTLES DRAWN AEROBIC ONLY Blood Culture results may not be optimal due to an inadequate volume of blood received in culture bottles   Culture   Final    NO GROWTH 5 DAYS Performed at [facility], [place] [address], [place], [place] [address]    Report Status 01/05/2020 FINAL  Final  MRSA PCR Screening     Status: None   Collection Time: 12/31/19  7:13 PM   Specimen: Nasal Mucosa; Nasopharyngeal  Result Value Ref Range Status   MRSA by PCR NEGATIVE NEGATIVE Final    Comment:        The GeneXpert MRSA Assay (FDA approved for NASAL specimens only), is one component of a comprehensive MRSA colonization surveillance program. It is not intended to diagnose MRSA infection nor to guide or monitor treatment for MRSA infections. Performed at [facility], [place] [address], [place], [place] [address]   Urine culture     Status: None   Collection Time: 12/31/19  8:28 PM   Specimen: Urine, Random  Result Value Ref Range Status   Specimen Description URINE,  RANDOM  Final   Special Requests NONE  Final   Culture   Final    NO GROWTH Performed at [facility], [place] [address], [place], [place] [address]    Report Status 01/02/2020 FINAL  Final  CSF culture     Status: None (Preliminary result)   Collection Time: 01/02/20 12:08 PM   Specimen: CSF; Cerebrospinal Fluid  Result Value Ref Range Status   Specimen Description CSF  Final   Special Requests NONE  Final   Gram Stain   Final    WBC PRESENT, PREDOMINANTLY MONONUCLEAR RED BLOOD CELLS PRESENT NO ORGANISMS SEEN    Culture   Final    NO GROWTH 2 DAYS Performed at [facility], [place] [address], [place], [place] [address]    Report Status PENDING  Incomplete     Radiology Studies: IR Fluoro Guide Ndl Plmt / BX  Result Date: 01/03/2020 CLINICAL DATA:  29 year old female referred for lumbar puncture EXAM: DIAGNOSTIC  LUMBAR PUNCTURE UNDER FLUOROSCOPIC GUIDANCE FLUOROSCOPY TIME:  Fluoroscopy Time:  0 minutes 12 seconds MEDICATIONS: 0.5 mg Versed, 50 mcg fentanyl. 15 minutes sedation time for moderate sedation The patient was monitored during the procedure by the interventional radiology nursing staff and staff position PROCEDURE: Informed consent was obtained from the patient prior to the procedure, including potential complications of headache, allergy, and pain. With the patient prone, the lower back was prepped with Betadine. 1% Lidocaine was used for local anesthesia. Lumbar puncture was performed at the L4-L5 level using a 20 gauge needle with return of clear CSF with an opening pressure of 12 cm water. Ten ml of CSF were obtained for laboratory studies. The patient tolerated the procedure well and there were no apparent complications. IMPRESSION: Fluoro guided LP. Electronically Signed   By: Corrie Mckusick D.O.   On: 01/03/2020 17:10   Overnight EEG with video  Result Date: 01/05/2020 Lora Havens, MD     01/05/2020  7:56 AM Patient Name:Deadra Ernst Bowler LOV:[id number] Epilepsy Attending:Priyanka Barbra Sarks Referring Physician/Provider:Dr Roland Rack Duration: 01/04/2020 1509 to 01/05/2020 0730  Patient history:29 year old female with history of IV drug use presented with left hemianopsia and neglect. EEG to evaluate for seizure.  Level of alertness:awake, asleep  AEDs during EEG study:Topiramate  Technical aspects: This EEG study was done with scalp electrodes positioned according to the 10-20 International system of electrode placement. Electrical activity was acquired at a sampling rate of 500Hz  and reviewed with a high frequency filter of 70Hz  and a low frequency filter of 1Hz . EEG data were recorded continuously and digitally stored.  Description: No clear posterior dominant rhythm was seen.Sleep was characterized by sleep spindles (12-14hz ) maximal frontocentral region. EEG showed  intermittent rhythmic high amplitude sharply contoured 2-3hz  delta slowing as well as intemittent generalized rhythmic 2-3hz  delta slowing. Hyperventilation and photic stimulation were not performed.  Of note, parts of eeg were difficult to interpret around 6am on 01/05/2020 due to significant electrode artifact.  ABNORMALITY - Interittent rhythmic delta slow,  generalized and Lateralized right hemisphere  IMPRESSION: This studyshowed lateralized rhythmic delta activity in right hemisphere ( LRDA) which is on the ictal-interictal continuum with low potential for seizures and can also be seen secondary to underlying structural abnormality, post-ictal state.  Additionally, there is evidence of moderate diffuse encephalopathy, nonspecific etiology. No definite seizures or epileptiform discharges were seen throughout the recording.   [place]    LOS: 5 days   Darliss Cheney, MD [facility]  01/05/2020, 12:33 PM

## 2020-01-05 NOTE — Procedures (Addendum)
Patient Name:Amber [name] PPG:[id number] Epilepsy Attending: Barbra Sarks Referring Physician/Provider:Dr Roland Rack Duration: 01/04/2020 1509 to 01/05/2020 1509  Patient history:29 year old female with history of IV drug use presented with left hemianopsia and neglect. EEG to evaluate for seizure.  Level of alertness:awake, asleep  AEDs during EEG study:Topiramate  Technical aspects: This EEG study was done with scalp electrodes positioned according to the 10-20 International system of electrode placement. Electrical activity was acquired at a sampling rate of 500Hz  and reviewed with a high frequency filter of 70Hz  and a low frequency filter of 1Hz . EEG data were recorded continuously and digitally stored.   Description: No clear posterior dominant rhythm was seen.Sleep was characterized by sleep spindles (12-14hz ) maximal frontocentral region. EEG showed intermittent rhythmic high amplitude sharply contoured 2-3hz  delta slowing as well as intemittent generalized rhythmic 2-3hz  delta slowing. Hyperventilation and photic stimulation were not performed.   Of note, parts of eeg were difficult to interpret around 6am on 01/05/2020 due to significant electrode artifact.  ABNORMALITY - Interittent rhythmic delta slow,  generalized and Lateralized right hemisphere  IMPRESSION: This studyshowed lateralized rhythmic delta activity in right hemisphere ( LRDA) which is on the ictal-interictal continuum with low potential for seizures and can also be seen secondary to underlying structural abnormality, post-ictal state.  Additionally, there is evidence of moderate diffuse encephalopathy, nonspecific etiology. No definite seizures or epileptiform discharges were seen throughout the recording.    Barbra Sarks

## 2020-01-05 NOTE — Progress Notes (Signed)
Subjective: Patient reports that she feels better, headache somewhat improved.   Exam: Vitals:   01/05/20 0433 01/05/20 0900  BP: (!) 109/56 115/60  Pulse: 82 82  Resp: 18 18  Temp: 97.7 F (36.5 C) 97.6 F (36.4 C)  SpO2: 100%    Gen: In bed, NAD Resp: non-labored breathing, no acute distress Abd: soft, nt  Neuro: MS: Awake, alert, she does have mild sensory neglect today CN: Pupils equal round and reactive, she crosses midline in both directions, visual fields are full Motor: She moves both sides relatively equally today, but with 4+/5 weakness on the left.  Sensory: She endorses symmetric sensation, and does not  Pertinent Labs: Cr 0.74 PCT < 0.1 UDS + for opiates and benzos(both given in ED) Blood culture negative x 2 days CSF: WBC 14, 97% lymphs RBC 73 Protein 60 Glucose 66 ESR, CRP -elevated, though in the setting of recent intubation low-grade fever, of unclear significance ANA - negative ANCA, RF, SSA/SSB - pending ENA - negative   Impression: 29 year old female with focal right parietal deficits of unclear etiology.  Her initial CT perfusion scan did suggest hypoperfusion in this area which originally made me suspect that this was ischemia, but with persistent negative MRI as well as persistent symptoms, I think that cerebral ischemia is unlikely.   One other cause of hypoperfusion is post ictal state, and if she is intermittently having seizures, this could explain her symptoms.  She had an unusual pattern on EEG which is on the ictal-interictal continuum, though on the less concerning side of that continuum. I do think it would be helpful to   She does have a pleocytosis, at 14, unclear if this represents inflammation, seizure related changes, versus some type of infectious process. If she continues to lack any type of infectious signs, then consideration of steroids would be prudent.  Recommendations: 1) discontinue acyclovir 2) start Depakote, 500 3 times  daily following load 3) continue topamax 4) continue EEG  Roland Rack, MD [facility] [phone]  If 7pm- 7am, please page neurology on call as listed in [place].

## 2020-01-05 NOTE — Progress Notes (Signed)
LTM maint complete - no skin breakdown under: Fp1 Fp2 F3 F4

## 2020-01-06 LAB — CBC WITH DIFFERENTIAL/PLATELET
Abs Immature Granulocytes: 0.02 10*3/uL (ref 0.00–0.07)
Basophils Absolute: 0.1 10*3/uL (ref 0.0–0.1)
Basophils Relative: 1 %
Eosinophils Absolute: 0.1 10*3/uL (ref 0.0–0.5)
Eosinophils Relative: 2 %
HCT: 39.5 % (ref 36.0–46.0)
Hemoglobin: 13.6 g/dL (ref 12.0–15.0)
Immature Granulocytes: 0 %
Lymphocytes Relative: 38 %
Lymphs Abs: 2.5 10*3/uL (ref 0.7–4.0)
MCH: 32.5 pg (ref 26.0–34.0)
MCHC: 34.4 g/dL (ref 30.0–36.0)
MCV: 94.5 fL (ref 80.0–100.0)
Monocytes Absolute: 0.6 10*3/uL (ref 0.1–1.0)
Monocytes Relative: 8 %
Neutro Abs: 3.4 10*3/uL (ref 1.7–7.7)
Neutrophils Relative %: 51 %
Platelets: 289 10*3/uL (ref 150–400)
RBC: 4.18 MIL/uL (ref 3.87–5.11)
RDW: 12.1 % (ref 11.5–15.5)
WBC: 6.7 10*3/uL (ref 4.0–10.5)
nRBC: 0 % (ref 0.0–0.2)

## 2020-01-06 LAB — BASIC METABOLIC PANEL
Anion gap: 9 (ref 5–15)
BUN: 10 mg/dL (ref 6–20)
CO2: 22 mmol/L (ref 22–32)
Calcium: 9.7 mg/dL (ref 8.9–10.3)
Chloride: 105 mmol/L (ref 98–111)
Creatinine, Ser: 0.77 mg/dL (ref 0.44–1.00)
GFR, Estimated: >60 mL/min (ref >60)
Glucose, Bld: 110 mg/dL — ABNORMAL HIGH (ref 70–99)
Potassium: 3.2 mmol/L — ABNORMAL LOW (ref 3.5–5.1)
Sodium: 136 mmol/L (ref 135–145)

## 2020-01-06 LAB — CULTURE, BLOOD (ROUTINE X 2): Special Requests: ADEQUATE

## 2020-01-06 LAB — MISC LABCORP TEST (SEND OUT)

## 2020-01-06 LAB — ANCA TITERS
Atypical P-ANCA titer: <1:20 {titer}
C-ANCA: <1:20 {titer}
P-ANCA: <1:20 {titer}

## 2020-01-06 LAB — RHEUMATOID FACTOR: Rheumatoid fact SerPl-aCnc: 12.5 IU/mL (ref 0.0–13.9)

## 2020-01-06 LAB — SJOGRENS SYNDROME-B EXTRACTABLE NUCLEAR ANTIBODY: SSB (La) (ENA) Antibody, IgG: <0.2 AI (ref 0.0–0.9)

## 2020-01-06 LAB — VDRL, CSF: VDRL Quant, CSF: NONREACTIVE

## 2020-01-06 LAB — ANGIOTENSIN CONVERTING ENZYME, CSF: Angio Convert Enzyme: <1.5 U/L (ref 0.0–2.5)

## 2020-01-06 LAB — TRIGLYCERIDES: Triglycerides: 115 mg/dL (ref <150)

## 2020-01-06 LAB — VALPROIC ACID LEVEL: Valproic Acid Lvl: 66 ug/mL (ref 50.0–100.0)

## 2020-01-06 LAB — SJOGRENS SYNDROME-A EXTRACTABLE NUCLEAR ANTIBODY: SSA (Ro) (ENA) Antibody, IgG: <0.2 AI (ref 0.0–0.9)

## 2020-01-06 MED ORDER — HYDROMORPHONE HCL 1 MG/ML IJ SOLN
1.0000 mg | Freq: Once | INTRAMUSCULAR | Status: AC
  Administered 2020-01-06: 1 mg via INTRAVENOUS
  Filled 2020-01-06: qty 1

## 2020-01-06 MED ORDER — IBUPROFEN 600 MG PO TABS
600.0000 mg | ORAL_TABLET | Freq: Four times a day (QID) | ORAL | Status: DC | PRN
  Administered 2020-01-06: 600 mg via ORAL
  Filled 2020-01-06: qty 1

## 2020-01-06 NOTE — Progress Notes (Signed)
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 In the ED was having severe right temporal headache 10 out of 10 multiple attempts to get CT were unsuccessful subsequently needing sedation, intubation and was managed in ICU seen by PCCM, neurology Blood culture urine cultures were ordered 10/26, patient was placed on empiric cefepime, vancomycin Forehead no evidence of acute abnormality chronic microhemorrhages, underwent EEG. Patient was extubated 10/27 Transferred to [facility] 10/28 Underwent repeat EEG 10/28   Assessment & Plan:  Transient neuro deficit in the setting of severe headache: TIA versus seizure or complicated migraine: Seen by neurology MRI brain negative for acute stroke, scattered supra and infratentorial microhemorrhages, underwent a stroke work-up with LDL 111, 2D echo-LVEF 60 to 31%, normal diastolic parameters normal right ventricular function-no other acute findings, hemoglobin A1c 5.5.  Undergoing extensive evaluation by neuro, had EEG which also does not support epileptiform activity.  Has elevated CRP and ESR.  Neurology tried to do LP but failed 3 EEG going on..  Subsequently she underwent LP by IR for cells, glucose, protein, ACE, crypto antigen, fungal smear and further testing.  All testing is still pending.  She had slightly elevated white blood cells but normal glucose and slightly elevated protein.  She was started on acyclovir and cefepime by neurology.  Neuro recommended continuous EEG and repeat MRI but patient refused both of these and and then she agreed for continuous EEG and per neuro note, it showed unusual pattern which was not clear and fell into a gray zone so she was started on  Depakote 500 mg 3 times daily on 01/05/2020.  Neurology discontinued acyclovir today.  I had a long discussion with Dr. Curly Shores who recommends keeping this patient overnight, checking her Depakote level in the morning and likely discharging home tomorrow.  Acute agitation unclear etiology concern for withdrawal but seems to be taking methadone.  Intubated in the ED for airway protection , off sedation, continue medication as needed.  Currently stable and she has remained calm whenever I have seen her.  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  Continue methadone and increase slowly at home dose 115 mg, noted to have prolonged QTC 569 so unable to use a full dose-pharmacy monitoring, continue dose per pharmacy monitor electrolytes.  Low-grade fever overnight 100.5/bacteremia: Last temperature on 01/01/2020.  She was started on vancomycin and cefepime.  Vancomycin was discontinued and cefepime was continued.  Only 1/4 bottle of blood culture growing Propionibacterium.  She remains a stable with no leukocytosis.  ID was consulted per neurology recommendation and ID recommended discontinuing antibiotics.  Hypokalemia : 3.2 today.  Replace orally.  Recheck in the morning.  Acute respiratory failure :needing intubation for agitation now extubated 10/27,   Thyroid nodule, left, will need ultrasound f/u as outpatient.  Possible seizure/migraine headache: She remains on Topamax.  Managed by neurology.  EEG results as below.  Continuous EEG going on right now.  Management of seizure as mentioned above.  Nutrition: Diet Order            Diet Heart Room service appropriate? Yes; Fluid consistency: Thin  Diet effective now  Body mass index is 29.35 kg/m.  DVT prophylaxis: SCDs Start: 12/31/19 1522 Code Status:   Code Status: Full Code  Family Communication: plan of care discussed with patient at bedside.  Status is: Inpatient  Remains  inpatient appropriate because:Ongoing diagnostic testing needed not appropriate for outpatient work up and Inpatient level of care appropriate due to severity of illness   Dispo: The patient is from: Home              Anticipated d/c is to: TBD              Anticipated d/c date is: 3 days              Patient currently is not medically stable to d/c.    Consultants:see note  Procedures:see note  EEG 10/27 : On propofol Versed phenobarbital.  This study is suggestive of profound diffuse encephalopathy, nonspecific etiology but likely related to sedation.  No seizures or epileptiform discharges were seen throughout the recording.  EEG 10/28:This studyshowed lateralized rhythmic delta activity in right hemisphere ( LRDA) which is on the ictal-interictal continuum with low potential for seizures and can also be seen secondary to underlying structural abnormality, post-ictal state.  Additionally, there is evidence of moderate diffuse encephalopathy, nonspecific etiology. No definite seizures or epileptiform discharges were seen throughout the recording   Culture/Microbiology    Component Value Date/Time   SDES CSF 01/02/2020 [place] 01/02/2020 1208   CULT  01/02/2020 1208    NO GROWTH 3 DAYS Performed at [facility], [place] [address], [place], [place] [address]    REPTSTATUS PENDING 01/02/2020 1208    Other culture-see note  Medications: Scheduled Meds: . aspirin  325 mg Per Tube Daily   Or  . aspirin  300 mg Rectal Daily  . chlorhexidine gluconate (MEDLINE KIT)  15 mL Mouth Rinse BID  . divalproex  500 mg Oral Q8H  . methadone  80 mg Oral Daily  . pantoprazole  40 mg Oral Daily  . topiramate  50 mg Oral BID   Continuous Infusions: . sodium chloride 10 mL/hr at 01/06/20 0259    Antimicrobials: Anti-infectives (From admission, onward)   Start     Dose/Rate Route Frequency Ordered Stop   01/03/20 1945  acyclovir (ZOVIRAX) 670 mg in dextrose 5 % 100 mL  IVPB  Status:  Discontinued        670 mg 113.4 mL/hr over 60 Minutes Intravenous Every 8 hours 01/03/20 1915 01/05/20 1109   01/01/20 0200  ceFEPIme (MAXIPIME) 2 g in sodium chloride 0.9 % 100 mL IVPB  Status:  Discontinued        2 g 200 mL/hr over 30 Minutes Intravenous Every 8 hours 12/31/19 1646 01/06/20 1113   12/31/19 1700  vancomycin (VANCOREADY) IVPB 2000 mg/400 mL        2,000 mg 200 mL/hr over 120 Minutes Intravenous  Once 12/31/19 1626 12/31/19 1921   12/31/19 1700  ceFEPIme (MAXIPIME) 2 g in sodium chloride 0.9 % 100 mL IVPB        2 g 200 mL/hr over 30 Minutes Intravenous STAT 12/31/19 1646 12/31/19 1758    Subjective Seen and examined.  Pleasant.  Denied any complaint.  Still was asking when she could go home.  Objective: Vitals: Today's Vitals   01/06/20 0500 01/06/20 0850 01/06/20 1008 01/06/20 1249  BP: 119/80 115/70  107/69  Pulse: 89 86  74  Resp: _0 Temp:  98 F (36.7 C) 98.8 F (37.1 C)  (!) 97.5 F (36.4 C)  TempSrc: Oral Oral  Oral  SpO2: 99% 97%  97%  Weight: 80 kg     Height:      PainSc:  8  4      Intake/Output Summary (Last 24 hours) at 01/06/2020 1407 Last data filed at 01/06/2020 0913 Gross per 24 hour  Intake 182.39 ml  Output --  Net 182.39 ml   Filed Weights   01/01/20 0426 01/03/20 0538 01/06/20 0500  Weight: 86.3 kg 83.6 kg 80 kg   Weight change:   Intake/Output from previous day: 10/31 0701 - 11/01 0700 In: 912 [P.O.:360; IV Piggyback:552] Out: -  Intake/Output this shift: Total I/O In: 182.4 [P.O.:120; I.V.:62.4] Out: -   Examination:  General exam: Appears calm and comfortable  Respiratory system: Clear to auscultation. Respiratory effort normal. Cardiovascular system: S1 & S2 heard, RRR. No JVD, murmurs, rubs, gallops or clicks. No pedal edema. Gastrointestinal system: Abdomen is nondistended, soft and nontender. No organomegaly or masses felt. Normal bowel sounds heard. Central nervous system: Alert and  oriented. No focal neurological deficits. Extremities: Symmetric 5 x 5 power. Skin: No rashes, lesions or ulcers.  Psychiatry: Judgement and insight appear normal. Mood & affect appropriate.    Data Reviewed: I have personally reviewed following labs and imaging studies CBC: Recent Labs  Lab 12/31/19 1300 12/31/19 1300 12/31/19 1304 01/01/20 0124 01/03/20 0244 01/05/20 0418 01/06/20 1217  WBC 7.9  --   --  8.1 6.0 5.6 6.7  NEUTROABS 5.6  --   --   --  3.2 2.3 3.4  HGB 13.6   < > 13.6 12.5 13.1 14.8 13.6  HCT 40.2   < > 40.0 37.5 38.0 43.3 39.5  MCV 96.6  --   --  96.6 94.8 95.4 94.5  PLT 263  --   --  201 261 306 289   < > = values in this interval not displayed.   Basic Metabolic Panel: Recent Labs  Lab 01/01/20 0124 01/02/20 0034 01/03/20 0244 01/05/20 0418 01/06/20 1217  NA 139 140 137 138 136  K 3.4* 3.0* 4.0 3.3* 3.2*  CL 109 107 107 106 105  CO2 23 23 21* 21* 22  GLUCOSE 118* 94 105* 98 110*  BUN <5* <5* _0 CREATININE 0.62 0.64 0.74 0.81 0.77  CALCIUM 8.4* 8.8* 9.7 10.0 9.7  MG 1.8 1.8 2.0  --   --   PHOS 3.2  --   --   --   --    GFR: Estimated Creatinine Clearance: 106.5 mL/min (by C-G formula based on SCr of 0.77 mg/dL). Liver Function Tests: Recent Labs  Lab 12/31/19 1300 01/03/20 0244  AST 19 33  ALT 24 21  ALKPHOS 50 44  BILITOT 0.6 0.9  PROT 7.8 7.8  ALBUMIN 4.3 4.0   No results for input(s): LIPASE, AMYLASE in the last 168 hours. No results for input(s): AMMONIA in the last 168 hours. Coagulation Profile: Recent Labs  Lab 12/31/19 1300  INR 1.0   Cardiac Enzymes: No results for input(s): CKTOTAL, CKMB, CKMBINDEX, TROPONINI in the last 168 hours. BNP (last 3 results) No results for input(s): PROBNP in the last 8760 hours. HbA1C: No results for input(s): HGBA1C in the last 72 hours. CBG: Recent Labs  Lab 12/31/19 1258 12/31/19 2316  GLUCAP 137* 137*   Lipid Profile: Recent Labs    01/03/20 1916  TRIG 109  Thyroid  Function Tests: No results for input(s): TSH, T4TOTAL, FREET4, T3FREE, THYROIDAB in the last 72 hours. Anemia Panel: No results for input(s): VITAMINB12, FOLATE, FERRITIN, TIBC, IRON, RETICCTPCT in the last 72 hours. Sepsis Labs: Recent Labs  Lab 12/31/19 1615 12/31/19 2000 01/01/20 0124 01/02/20 0034  PROCALCITON <0.10  --  <0.10 <0.10  LATICACIDVEN 1.5 0.7  --   --     Recent Results (from the past 240 hour(s))  Respiratory Panel by RT PCR (Flu A&B, Covid) - Nasopharyngeal Swab     Status: None   Collection Time: 12/31/19  3:25 PM   Specimen: Nasopharyngeal Swab  Result Value Ref Range Status   SARS Coronavirus 2 by RT PCR NEGATIVE NEGATIVE Final    Comment: (NOTE) SARS-CoV-2 target nucleic acids are NOT DETECTED.  The SARS-CoV-2 RNA is generally detectable in upper respiratoy specimens during the acute phase of infection. The lowest concentration of SARS-CoV-2 viral copies this assay can detect is 131 copies/mL. A negative result does not preclude SARS-Cov-2 infection and should not be used as the sole basis for treatment or other patient management decisions. A negative result may occur with  improper specimen collection/handling, submission of specimen other than nasopharyngeal swab, presence of viral mutation(s) within the areas targeted by this assay, and inadequate number of viral copies (<131 copies/mL). A negative result must be combined with clinical observations, patient history, and epidemiological information. The expected result is Negative.  Fact Sheet for Patients:  PinkCheek.be  Fact Sheet for Healthcare Providers:  GravelBags.it  This test is no t yet approved or cleared by the [place] FDA and  has been authorized for detection and/or diagnosis of SARS-CoV-2 by FDA under an Emergency Use Authorization (EUA). This EUA will remain  in effect (meaning this test can be used) for the duration of  the COVID-19 declaration under Section 564(b)(1) of the Act, 21 U.S.C. section 360bbb-3(b)(1), unless the authorization is terminated or revoked sooner.     Influenza A by PCR NEGATIVE NEGATIVE Final   Influenza B by PCR NEGATIVE NEGATIVE Final    Comment: (NOTE) The Xpert Xpress SARS-CoV-2/FLU/RSV assay is intended as an aid in  the diagnosis of influenza from Nasopharyngeal swab specimens and  should not be used as a sole basis for treatment. Nasal washings and  aspirates are unacceptable for Xpert Xpress SARS-CoV-2/FLU/RSV  testing.  Fact Sheet for Patients: PinkCheek.be  Fact Sheet for Healthcare Providers: GravelBags.it  This test is not yet approved or cleared by the [place] FDA and  has been authorized for detection and/or diagnosis of SARS-CoV-2 by  FDA under an Emergency Use Authorization (EUA). This EUA will remain  in effect (meaning this test can be used) for the duration of the  Covid-19 declaration under Section 564(b)(1) of the Act, 21  U.S.C. section 360bbb-3(b)(1), unless the authorization is  terminated or revoked. Performed at [facility], [place] [address] [place],  [address]   Culture, blood (routine x 2)     Status: Abnormal   Collection Time: 12/31/19  4:15 PM   Specimen: BLOOD  Result Value Ref Range Status   Specimen Description BLOOD SITE NOT SPECIFIED  Final   Special Requests   Final    BOTTLES DRAWN AEROBIC AND ANAEROBIC Blood Culture adequate volume   Culture  Setup Time   Final    GRAM POSITIVE RODS ANAEROBIC BOTTLE ONLY CRITICAL RESULT CALLED TO, READ BACK BY AND VERIFIED WITH: PHARMD M. PHAN 1302 [id number] FCP  Culture (A)  Final    PROPIONIBACTERIUM ACNES Standardized susceptibility testing for this organism is not available. Performed at [facility], [place] [address] [place], [place] [address]    Report Status 01/06/2020 FINAL  Final  Blood Culture ID  Panel (Reflexed)     Status: None   Collection Time: 12/31/19  4:15 PM  Result Value Ref Range Status   Enterococcus faecalis NOT DETECTED NOT DETECTED Final   Enterococcus Faecium NOT DETECTED NOT DETECTED Final   Listeria monocytogenes NOT DETECTED NOT DETECTED Final   Staphylococcus species NOT DETECTED NOT DETECTED Final   Staphylococcus aureus (BCID) NOT DETECTED NOT DETECTED Final   Staphylococcus epidermidis NOT DETECTED NOT DETECTED Final   Staphylococcus lugdunensis NOT DETECTED NOT DETECTED Final   Streptococcus species NOT DETECTED NOT DETECTED Final   Streptococcus agalactiae NOT DETECTED NOT DETECTED Final   Streptococcus pneumoniae NOT DETECTED NOT DETECTED Final   Streptococcus pyogenes NOT DETECTED NOT DETECTED Final   A.calcoaceticus-baumannii NOT DETECTED NOT DETECTED Final   Bacteroides fragilis NOT DETECTED NOT DETECTED Final   Enterobacterales NOT DETECTED NOT DETECTED Final   Enterobacter cloacae complex NOT DETECTED NOT DETECTED Final   Escherichia coli NOT DETECTED NOT DETECTED Final   Klebsiella aerogenes NOT DETECTED NOT DETECTED Final   Klebsiella oxytoca NOT DETECTED NOT DETECTED Final   Klebsiella pneumoniae NOT DETECTED NOT DETECTED Final   Proteus species NOT DETECTED NOT DETECTED Final   Salmonella species NOT DETECTED NOT DETECTED Final   Serratia marcescens NOT DETECTED NOT DETECTED Final   Haemophilus influenzae NOT DETECTED NOT DETECTED Final   Neisseria meningitidis NOT DETECTED NOT DETECTED Final   Pseudomonas aeruginosa NOT DETECTED NOT DETECTED Final   Stenotrophomonas maltophilia NOT DETECTED NOT DETECTED Final   Candida albicans NOT DETECTED NOT DETECTED Final   Candida auris NOT DETECTED NOT DETECTED Final   Candida glabrata NOT DETECTED NOT DETECTED Final   Candida krusei NOT DETECTED NOT DETECTED Final   Candida parapsilosis NOT DETECTED NOT DETECTED Final   Candida tropicalis NOT DETECTED NOT DETECTED Final   Cryptococcus  neoformans/gattii NOT DETECTED NOT DETECTED Final    Comment: Performed at [facility], [address], [place], [place] [address]  Culture, blood (routine x 2)     Status: None   Collection Time: 12/31/19  4:36 PM   Specimen: BLOOD  Result Value Ref Range Status   Specimen Description BLOOD SITE NOT SPECIFIED  Final   Special Requests   Final    BOTTLES DRAWN AEROBIC ONLY Blood Culture results may not be optimal due to an inadequate volume of blood received in culture bottles   Culture   Final    NO GROWTH 5 DAYS Performed at [facility], [place] [address], [place], [place] [address]    Report Status 01/05/2020 FINAL  Final  MRSA PCR Screening     Status: None   Collection Time: 12/31/19  7:13 PM   Specimen: Nasal Mucosa; Nasopharyngeal  Result Value Ref Range Status   MRSA by PCR NEGATIVE NEGATIVE Final    Comment:        The GeneXpert MRSA Assay (FDA approved for NASAL specimens only), is one component of a comprehensive MRSA colonization surveillance program. It is not intended to diagnose MRSA infection nor to guide or monitor treatment for MRSA infections. Performed at [facility], [place] [address] [place], [place] [address]   Urine culture     Status: None  Collection Time: 12/31/19  8:28 PM   Specimen: Urine, Random  Result Value Ref Range Status   Specimen Description URINE, RANDOM  Final   Special Requests NONE  Final   Culture   Final    NO GROWTH Performed at [facility], [place] [address] [place], [place] [address]    Report Status 01/02/2020 FINAL  Final  CSF culture     Status: None (Preliminary result)   Collection Time: 01/02/20 12:08 PM   Specimen: CSF; Cerebrospinal Fluid  Result Value Ref Range Status   Specimen Description CSF  Final   Special Requests NONE  Final   Gram Stain   Final    WBC PRESENT, PREDOMINANTLY MONONUCLEAR RED BLOOD CELLS PRESENT NO ORGANISMS SEEN    Culture   Final    NO GROWTH 3 DAYS Performed at  [facility], [place] [address], [place], [place] [address]    Report Status PENDING  Incomplete     Radiology Studies: Overnight EEG with video  Result Date: 01/05/2020 Lora Havens, MD     01/06/2020  9:29 AM Patient Name:Amber [name] IWO:[id number] Epilepsy Attending:Priyanka Barbra Sarks Referring Physician/Provider:Dr Roland Rack Duration: 01/04/2020 1509 to 01/05/2020 1509  Patient history:29 year old female with history of IV drug use presented with left hemianopsia and neglect. EEG to evaluate for seizure.  Level of alertness:awake, asleep  AEDs during EEG study:Topiramate  Technical aspects: This EEG study was done with scalp electrodes positioned according to the 10-20 International system of electrode placement. Electrical activity was acquired at a sampling rate of _0  and reviewed with a high frequency filter of _1  and a low frequency filter of _2 . EEG data were recorded continuously and digitally stored.  Description: No clear posterior dominant rhythm was seen.Sleep was characterized by sleep spindles (12-_3 ) maximal frontocentral region. EEG showed intermittent rhythmic high amplitude sharply contoured 2-_4  delta slowing as well as intemittent generalized rhythmic 2-_5  delta slowing. Hyperventilation and photic stimulation were not performed.  Of note, parts of eeg were difficult to interpret around 6am on 01/05/2020 due to significant electrode artifact.  ABNORMALITY - Interittent rhythmic delta slow,  generalized and Lateralized right hemisphere  IMPRESSION: This studyshowed lateralized rhythmic delta activity in right hemisphere ( LRDA) which is on the ictal-interictal continuum with low potential for seizures and can also be seen secondary to underlying structural abnormality, post-ictal state.  Additionally, there is evidence of moderate diffuse encephalopathy, nonspecific etiology. No definite seizures or epileptiform discharges were seen  throughout the recording.   Lora Havens    LOS: 6 days   Darliss Cheney, MD [facility]  01/06/2020, 2:07 PM

## 2020-01-06 NOTE — Progress Notes (Signed)
LTM EEG discontinued - no skin breakdown at unhook.   

## 2020-01-06 NOTE — Consult Note (Signed)
[facility]ious [facility]       Reason for Consult: GPR in 1 blood culture    Referring Physician: Dr. Curly Shores  Active Problems:   Acute left-sided weakness   Mania ([place])   . aspirin  325 mg Per Tube Daily   Or  . aspirin  300 mg Rectal Daily  . chlorhexidine gluconate (MEDLINE KIT)  15 mL Mouth Rinse BID  . divalproex  500 mg Oral Q8H  . methadone  80 mg Oral Daily  . pantoprazole  40 mg Oral Daily  . topiramate  50 mg Oral BID    Recommendations: Stop antibiotics   Assessment: She has 1/4 blood culture bottles positive for Propionibacterium and no signs or symptoms of infection.  Some concern by neurology of elevated WBCs of 14 in the CSF but this is c/w pleocytosis from her seizures and not particularly concerning for infection.   Antibiotics: Cefepime  HPI: Amber [name] is a 29 y.o. female with history of IVDU with agitation and required intubation with focal right parietal deficit most c/w seizure disorder.  Now on appropriate treatment and improved. Did have an LP with minimal findings of 14 WBCs, protein of 60, glucose 66.  Also had 1 blood culture bottle positive with GPR and now with Propionibcterium.  No fever, normal wbc.     Review of Systems:  Constitutional: negative for fevers and chills Gastrointestinal: negative for nausea All other systems reviewed and are negative    Past Medical History:  Diagnosis Date  . Abnormal Pap smear   . GBS (group B streptococcus) UTI complicating pregnancy 0/35/5974  . IC (interstitial cystitis) 04/17/2012  . Interstitial cystitis   . Ovarian cyst   . Protein in urine   . Renal abscess     Social History   Tobacco Use  . Smoking status: Current Every Day Smoker    Packs/day: 0.50    Years: 4.00    Pack years: 2.00    Types: Cigarettes  . Smokeless tobacco: Never Used  Vaping Use  . Vaping Use: Never used  Substance Use Topics  . Alcohol use: No    Comment: occ  . Drug use: No    Types:  Benzodiazepines, Marijuana, Oxycodone    Comment: no drug use since pregnancy-  NONE SINCE     Family History  Problem Relation Age of Onset  . Cancer Maternal Uncle   . Diabetes Maternal Grandmother   . Cancer Maternal Grandmother        colon  . Diabetes Paternal Grandmother   . Cancer Paternal Grandmother        bone  . Other Neg Hx     Allergies  Allergen Reactions  . Clarithromycin Anaphylaxis    Biaxin    Physical Exam: Constitutional: in no apparent distress  Vitals:   01/06/20 0500 01/06/20 0850  BP: 119/80 115/70  Pulse: 89 86  Resp: 18 18  Temp: 98 F (36.7 C) 98.8 F (37.1 C)  SpO2: 99% 97%   EYES: anicteric Cardiovascular: Cor RRR Respiratory: clear; Musculoskeletal: no pedal edema noted Skin: negatives: no rash   Lab Results  Component Value Date   WBC 5.6 01/05/2020   HGB 14.8 01/05/2020   HCT 43.3 01/05/2020   MCV 95.4 01/05/2020   PLT 306 01/05/2020    Lab Results  Component Value Date   CREATININE 0.81 01/05/2020   BUN 9 01/05/2020   NA 138 01/05/2020   K 3.3 (L)  01/05/2020   CL 106 01/05/2020   CO2 21 (L) 01/05/2020    Lab Results  Component Value Date   ALT 21 01/03/2020   AST 33 01/03/2020   ALKPHOS 44 01/03/2020     Microbiology: Recent Results (from the past 240 hour(s))  Respiratory Panel by RT PCR (Flu A&B, Covid) - Nasopharyngeal Swab     Status: None   Collection Time: 12/31/19  3:25 PM   Specimen: Nasopharyngeal Swab  Result Value Ref Range Status   SARS Coronavirus 2 by RT PCR NEGATIVE NEGATIVE Final    Comment: (NOTE) SARS-CoV-2 target nucleic acids are NOT DETECTED.  The SARS-CoV-2 RNA is generally detectable in upper respiratoy specimens during the acute phase of infection. The lowest concentration of SARS-CoV-2 viral copies this assay can detect is 131 copies/mL. A negative result does not preclude SARS-Cov-2 infection and should not be used as the sole basis for treatment or other patient management  decisions. A negative result may occur with  improper specimen collection/handling, submission of specimen other than nasopharyngeal swab, presence of viral mutation(s) within the areas targeted by this assay, and inadequate number of viral copies (<131 copies/mL). A negative result must be combined with clinical observations, patient history, and epidemiological information. The expected result is Negative.  Fact Sheet for Patients:  PinkCheek.be  Fact Sheet for Healthcare Providers:  GravelBags.it  This test is no t yet approved or cleared by the [place] FDA and  has been authorized for detection and/or diagnosis of SARS-CoV-2 by FDA under an Emergency Use Authorization (EUA). This EUA will remain  in effect (meaning this test can be used) for the duration of the COVID-19 declaration under Section 564(b)(1) of the Act, 21 U.S.C. section 360bbb-3(b)(1), unless the authorization is terminated or revoked sooner.     Influenza A by PCR NEGATIVE NEGATIVE Final   Influenza B by PCR NEGATIVE NEGATIVE Final    Comment: (NOTE) The Xpert Xpress SARS-CoV-2/FLU/RSV assay is intended as an aid in  the diagnosis of influenza from Nasopharyngeal swab specimens and  should not be used as a sole basis for treatment. Nasal washings and  aspirates are unacceptable for Xpert Xpress SARS-CoV-2/FLU/RSV  testing.  Fact Sheet for Patients: PinkCheek.be  Fact Sheet for Healthcare Providers: GravelBags.it  This test is not yet approved or cleared by the [place] FDA and  has been authorized for detection and/or diagnosis of SARS-CoV-2 by  FDA under an Emergency Use Authorization (EUA). This EUA will remain  in effect (meaning this test can be used) for the duration of the  Covid-19 declaration under Section 564(b)(1) of the Act, 21  U.S.C. section 360bbb-3(b)(1), unless the  authorization is  terminated or revoked. Performed at [facility], [place] [address], [place], [place] [address]   Culture, blood (routine x 2)     Status: Abnormal   Collection Time: 12/31/19  4:15 PM   Specimen: BLOOD  Result Value Ref Range Status   Specimen Description BLOOD SITE NOT SPECIFIED  Final   Special Requests   Final    BOTTLES DRAWN AEROBIC AND ANAEROBIC Blood Culture adequate volume   Culture  Setup Time   Final    GRAM POSITIVE RODS ANAEROBIC BOTTLE ONLY CRITICAL RESULT CALLED TO, READ BACK BY AND VERIFIED WITH: PHARMD M. PHAN 1302 [id number] FCP    Culture (A)  Final    PROPIONIBACTERIUM ACNES Standardized susceptibility testing for this organism is not available. Performed at [facility], [place]  [address] [place], [place] [address]    Report Status 01/06/2020 FINAL  Final  Blood Culture ID Panel (Reflexed)     Status: None   Collection Time: 12/31/19  4:15 PM  Result Value Ref Range Status   Enterococcus faecalis NOT DETECTED NOT DETECTED Final   Enterococcus Faecium NOT DETECTED NOT DETECTED Final   Listeria monocytogenes NOT DETECTED NOT DETECTED Final   Staphylococcus species NOT DETECTED NOT DETECTED Final   Staphylococcus aureus (BCID) NOT DETECTED NOT DETECTED Final   Staphylococcus epidermidis NOT DETECTED NOT DETECTED Final   Staphylococcus lugdunensis NOT DETECTED NOT DETECTED Final   Streptococcus species NOT DETECTED NOT DETECTED Final   Streptococcus agalactiae NOT DETECTED NOT DETECTED Final   Streptococcus pneumoniae NOT DETECTED NOT DETECTED Final   Streptococcus pyogenes NOT DETECTED NOT DETECTED Final   A.calcoaceticus-baumannii NOT DETECTED NOT DETECTED Final   Bacteroides fragilis NOT DETECTED NOT DETECTED Final   Enterobacterales NOT DETECTED NOT DETECTED Final   Enterobacter cloacae complex NOT DETECTED NOT DETECTED Final   Escherichia coli NOT DETECTED NOT DETECTED Final   Klebsiella aerogenes NOT DETECTED NOT DETECTED Final    Klebsiella oxytoca NOT DETECTED NOT DETECTED Final   Klebsiella pneumoniae NOT DETECTED NOT DETECTED Final   Proteus species NOT DETECTED NOT DETECTED Final   Salmonella species NOT DETECTED NOT DETECTED Final   Serratia marcescens NOT DETECTED NOT DETECTED Final   Haemophilus influenzae NOT DETECTED NOT DETECTED Final   Neisseria meningitidis NOT DETECTED NOT DETECTED Final   Pseudomonas aeruginosa NOT DETECTED NOT DETECTED Final   Stenotrophomonas maltophilia NOT DETECTED NOT DETECTED Final   Candida albicans NOT DETECTED NOT DETECTED Final   Candida auris NOT DETECTED NOT DETECTED Final   Candida glabrata NOT DETECTED NOT DETECTED Final   Candida krusei NOT DETECTED NOT DETECTED Final   Candida parapsilosis NOT DETECTED NOT DETECTED Final   Candida tropicalis NOT DETECTED NOT DETECTED Final   Cryptococcus neoformans/gattii NOT DETECTED NOT DETECTED Final    Comment: Performed at [facility], [address], [place], [place] [address]  Culture, blood (routine x 2)     Status: None   Collection Time: 12/31/19  4:36 PM   Specimen: BLOOD  Result Value Ref Range Status   Specimen Description BLOOD SITE NOT SPECIFIED  Final   Special Requests   Final    BOTTLES DRAWN AEROBIC ONLY Blood Culture results may not be optimal due to an inadequate volume of blood received in culture bottles   Culture   Final    NO GROWTH 5 DAYS Performed at [facility], [place] [address] [place], [place] [address]    Report Status 01/05/2020 FINAL  Final  MRSA PCR Screening     Status: None   Collection Time: 12/31/19  7:13 PM   Specimen: Nasal Mucosa; Nasopharyngeal  Result Value Ref Range Status   MRSA by PCR NEGATIVE NEGATIVE Final    Comment:        The GeneXpert MRSA Assay (FDA approved for NASAL specimens only), is one component of a comprehensive MRSA colonization surveillance program. It is not intended to diagnose MRSA infection nor to guide or monitor treatment for MRSA  infections. Performed at [facility], [place] [address] [place], [place] [address]   Urine culture     Status: None   Collection Time: 12/31/19  8:28 PM   Specimen: Urine, Random  Result Value Ref Range Status   Specimen Description URINE, RANDOM  Final  Special Requests NONE  Final   Culture   Final    NO GROWTH Performed at [facility], [place] [address], [place], [place] [address]    Report Status 01/02/2020 FINAL  Final  CSF culture     Status: None (Preliminary result)   Collection Time: 01/02/20 12:08 PM   Specimen: CSF; Cerebrospinal Fluid  Result Value Ref Range Status   Specimen Description CSF  Final   Special Requests NONE  Final   Gram Stain   Final    WBC PRESENT, PREDOMINANTLY MONONUCLEAR RED BLOOD CELLS PRESENT NO ORGANISMS SEEN    Culture   Final    NO GROWTH 3 DAYS Performed at [facility], [place] [address] [place], [place] [address]    Report Status PENDING  Incomplete    Thayer Headings, [facility] for Infectious Disease [place] [email or web address] 01/06/2020, 12:07 PM

## 2020-01-06 NOTE — Procedures (Addendum)
Patient Name:Amber [name] KYH:[id number] Epilepsy Attending: Barbra Sarks Referring Physician/Provider:Dr Roland Rack Duration:01/05/2020 1509 to 01/05/2020 2200  Patient history:29 year old female with history of IV drug use presented with left hemianopsia and neglect. EEG to evaluate for seizure.  Level of alertness:awake, asleep  AEDs during EEG study:Topiramate  Technical aspects: This EEG study was done with scalp electrodes positioned according to the 10-20 International system of electrode placement. Electrical activity was acquired at a sampling rate of 500Hz  and reviewed with a high frequency filter of 70Hz  and a low frequency filter of 1Hz . EEG data were recorded continuously and digitally stored.   Description: No clear posterior dominant rhythm was seen.Sleep was characterized by sleep spindles (12-14hz ) maximal frontocentral region. EEG showedintermittent rhythmic high amplitude sharply contoured 2-3hz  delta slowing as well as intemittent generalized rhythmic 2-3hz  delta slowing.Hyperventilation and photic stimulation were not performed.   Of note, EEG was uninterpretable after about 2200 on 01/05/2020 due to significant electrode artifact.  ABNORMALITY -Interittent rhythmic delta slow,generalized and Lateralizedright hemisphere  IMPRESSION: This studyshowed lateralized rhythmic delta activity in right hemisphere ( LRDA) which is on the ictal-interictal continuum with low potential for seizures and can also be seensecondary to underlying structural abnormality, post-ictal state. Additionally, there is evidence of moderatediffuse encephalopathy, nonspecific etiology.Nodefiniteseizures or epileptiform discharges were seen throughout the recording.    Barbra Sarks

## 2020-01-06 NOTE — Progress Notes (Signed)
Subjective/Interval events: - Depakote 500 mg TID started 10/31 after 3000 mg load at 10:30 AM - Got phenergan at 2 AM today and ativan at 18:30 and 22:49 yesterday - Patient reports that she feels better, headache continues to improve though it has not fully resolved.  - Denies any significant side effects from Depakote including excessive sleepiness - She again denies any prodromal infectious symptoms such as fever/chills/sweats prior to her admission  Exam: Vitals:   01/05/20 2100 01/06/20 0500  BP: 117/80 119/80  Pulse: 84 89  Resp: 18 18  Temp: 98.2 F (36.8 C) 98 F (36.7 C)  SpO2: 100% 99%   Gen: In bed, NAD Resp: non-labored breathing, no acute distress Abd: soft, nt  Neuro: MS: Awake, alert, with no evidence of neglect today to double simultaneous stimuli (visual and tactile tested) CN: Pupils equal round and reactive, she crosses midline in both directions, visual fields are full, no photophobia, symmetric smile, tongue midline, uvula elevates symmetrically Motor: Mild pronator drift that is symmetrical bilaterally.  On confrontational strength testing she is 5 out of 5 in the bilateral upper extremities Sensory: She endorses is symmetric to sensation in bilateral arms and legs and does not extinguish to double simultaneous stimuli anymore  Pertinent Labs: General labs Cr 0.81 UDS + for opiates and benzos(both given in ED) Blood cultures drawn 10/26 initially negative  ANAEROBIC GRAM POSITIVE RODSAbnormal in one of two bottles (possibly on gram stain?)  Per lab blood culture ID panel was negative for any pathogen  CSF: 10/28 Single tube counts only WBC 14, 97% lymphs RBC 73 Protein 60 Glucose 66 HSV 1/2 negative  Fungal culture NGT  Pending - ACE, VDRL, ENC1 mayo panel  Rheum labs ESR (40) , CRP (5.8) -elevated, though in the setting of recent intubation low-grade fever, of unclear significance ANA - negative ANCA, RF, SSA/SSB - pending ENA  (Antiextractable Nuclear Ag) - negative  EEG: Continuous EEG monitoring notable for high amplitude right-sided rhythmic delta activity at times with a small spike noted before slow activity  Impression: 29 year old female with focal right parietal deficits with mounting evidence for seizure disorder, given waxing and waning symptoms and negative MRI imaging.  The question is what to make of her CSF pleocytosis, which is higher than would be expected from seizures alone, and could alternatively reflect a partially treated CNS bacterial infection given LP was obtained 2 days after antibiotics were initiated.  Notably on review of her infectious course to date, she has been on cefepime since admission and has a blood culture reported with positive anaerobic gram positive rods in 1 of 2 bottles.  In the setting of cefepime in addition of Depakote, she has improved substantially clinically, and we will continue to follow her clinical exam at this point.  Recommendations: - continue Depakote 500 3 times daily,  - Depakote level at 2100 today prior to 2200 dose - continue topamax - discontinue EEG,  - clarify antibiotic course for cefepime and additional infectious work-up needed with infectious disease   Lesleigh Noe, MD-PhD [facility] [phone]  If 7pm- 7am, please page neurology on call as listed in [place].

## 2020-01-07 ENCOUNTER — Other Ambulatory Visit: Payer: Self-pay

## 2020-01-07 ENCOUNTER — Other Ambulatory Visit (HOSPITAL_COMMUNITY): Payer: Self-pay | Admitting: Family Medicine

## 2020-01-07 ENCOUNTER — Encounter: Payer: Self-pay | Admitting: Neurology

## 2020-01-07 ENCOUNTER — Encounter (HOSPITAL_COMMUNITY): Payer: Self-pay | Admitting: Internal Medicine

## 2020-01-07 LAB — CBC WITH DIFFERENTIAL/PLATELET
Abs Immature Granulocytes: 0.03 10*3/uL (ref 0.00–0.07)
Basophils Absolute: 0.1 10*3/uL (ref 0.0–0.1)
Basophils Relative: 1 %
Eosinophils Absolute: 0.2 10*3/uL (ref 0.0–0.5)
Eosinophils Relative: 3 %
HCT: 40.3 % (ref 36.0–46.0)
Hemoglobin: 13.8 g/dL (ref 12.0–15.0)
Immature Granulocytes: 1 %
Lymphocytes Relative: 43 %
Lymphs Abs: 2.4 10*3/uL (ref 0.7–4.0)
MCH: 32.3 pg (ref 26.0–34.0)
MCHC: 34.2 g/dL (ref 30.0–36.0)
MCV: 94.4 fL (ref 80.0–100.0)
Monocytes Absolute: 0.4 10*3/uL (ref 0.1–1.0)
Monocytes Relative: 8 %
Neutro Abs: 2.4 10*3/uL (ref 1.7–7.7)
Neutrophils Relative %: 44 %
Platelets: 277 10*3/uL (ref 150–400)
RBC: 4.27 MIL/uL (ref 3.87–5.11)
RDW: 12.2 % (ref 11.5–15.5)
WBC: 5.4 10*3/uL (ref 4.0–10.5)
nRBC: 0 % (ref 0.0–0.2)

## 2020-01-07 LAB — BASIC METABOLIC PANEL
Anion gap: 12 (ref 5–15)
BUN: 13 mg/dL (ref 6–20)
CO2: 20 mmol/L — ABNORMAL LOW (ref 22–32)
Calcium: 9.8 mg/dL (ref 8.9–10.3)
Chloride: 105 mmol/L (ref 98–111)
Creatinine, Ser: 0.81 mg/dL (ref 0.44–1.00)
GFR, Estimated: >60 mL/min (ref >60)
Glucose, Bld: 92 mg/dL (ref 70–99)
Potassium: 3.4 mmol/L — ABNORMAL LOW (ref 3.5–5.1)
Sodium: 137 mmol/L (ref 135–145)

## 2020-01-07 LAB — CSF CULTURE W GRAM STAIN: Culture: NO GROWTH

## 2020-01-07 LAB — PATHOLOGIST SMEAR REVIEW

## 2020-01-07 MED ORDER — TOPIRAMATE 50 MG PO TABS: 50.0000 mg | ORAL_TABLET | Freq: Two times a day (BID) | ORAL | 0 refills | Status: DC

## 2020-01-07 MED ORDER — POTASSIUM CHLORIDE CRYS ER 20 MEQ PO TBCR
40.0000 meq | EXTENDED_RELEASE_TABLET | ORAL | Status: AC
  Administered 2020-01-07 (×2): 40 meq via ORAL
  Filled 2020-01-07 (×3): qty 2

## 2020-01-07 MED ORDER — DIVALPROEX SODIUM 500 MG PO DR TAB: 500.0000 mg | DELAYED_RELEASE_TABLET | Freq: Three times a day (TID) | ORAL | 0 refills | Status: DC

## 2020-01-07 MED FILL — TOPIRAMATE 50 MG TABLET: 50 | 30 days supply | Qty: 60 | Fill #0

## 2020-01-07 MED FILL — DIVALPROEX SOD DR 500 MG TA: 500 | 30 days supply | Qty: 90 | Fill #0

## 2020-01-07 NOTE — Progress Notes (Signed)
Occupational Therapy Treatment Patient Details Name: Amber [name] MRN: [id number] DOB: 07/09/1990 Today's Date: 01/07/2020    History of present illness Pt is is a 29 y/o female with a PMHx of IVDU who presents to the ED with c/o left-sided neglect, weakness concerning for embolic stroke. MRI with no acute findings. Revealed scattered supratentorial and infratentorial chronic microhemorrhages. EEG completed 10/27 and suggestive of profound diffuse encephalopathy, nonspecific etiology but likely related to sedation.    OT comments  Pt making steady progress towards OT goals this session. Pt continues to be mostly limited by some mild cogntive impairments in the areas of, safety/judgement, attention, awareness, Problem solving, and Following commands. Session focus on administration of pillbox assessment with pt failing assessment d/t pt making >3 errors. Pt with poor insight into deficits with pt unaware of mistakes until OTA alerted pt to errors. 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 Offered compensatory strategies for home to assist with safe IADL/ ADL mgmt. Continue to recommend OP OT to further assess cognition and facilitate functional independence at home. Will continue to follow acutely per POC.    Follow Up Recommendations  Outpatient OT;Supervision - Intermittent    Equipment Recommendations  None recommended by OT    Recommendations for Other Services      Precautions / Restrictions Precautions Precautions: Fall Restrictions Weight Bearing Restrictions: No       Mobility Bed Mobility               General bed mobility comments: pt able to transition from supine to long sitting with no assist needed  Transfers                 General transfer comment: pt declined OOB mobility, " I walk fine"    Balance                                           ADL either performed or assessed with clinical judgement    ADL                                         General ADL Comments: pt declined ADL participation; session focus on administration of pillbox assessment. pt failed assessment d/t >3 errors. pt with poor insight into deficits unaware of mistakes     Vision       Perception     Praxis      Cognition Arousal/Alertness: Awake/alert Behavior During Therapy: WFL for tasks assessed/performed Overall Cognitive Status: Impaired/Different from baseline Area of Impairment: Safety/judgement;Attention;Awareness;Problem solving;Following commands                   Current Attention Level: Selective   Following Commands: Follows multi-step commands inconsistently Safety/Judgement: Decreased awareness of deficits Awareness: Intellectual Problem Solving: Slow processing;Difficulty sequencing General Comments: pt with decreased insight into deficits as pt noted to have no awareness of mistakes made during pillbox assessment even after orienting pt to mistakes pt insistent on mistakes being a result of meds not being her own. pts mistakes consisted of not completing placing pills for a full week, and unable to problem solve placing pills for "every other day" or "3x daily"        Exercises     Shoulder Instructions  General Comments      Pertinent Vitals/ Pain       Pain Assessment: Faces Faces Pain Scale: Hurts a little bit Pain Location: HA, back Pain Descriptors / Indicators: Headache;Aching Pain Intervention(s): Monitored during session  Home Living                                          Prior Functioning/Environment              Frequency  Min 2X/week        Progress Toward Goals  OT Goals(current goals can now be found in the care plan section)  Progress towards OT goals: Progressing toward goals  Acute Rehab OT Goals Patient Stated Goal: home OT Goal Formulation: With patient Time For Goal Achievement:  01/15/20 Potential to Achieve Goals: Good  Plan Discharge plan remains appropriate;Frequency remains appropriate    Co-evaluation                 AM-PAC OT "6 Clicks" Daily Activity     Outcome Measure   Help from another person eating meals?: None Help from another person taking care of personal grooming?: None Help from another person toileting, which includes using toliet, bedpan, or urinal?: None Help from another person bathing (including washing, rinsing, drying)?: A Little Help from another person to put on and taking off regular upper body clothing?: None Help from another person to put on and taking off regular lower body clothing?: None 6 Click Score: 23    End of Session    OT Visit Diagnosis: Other symptoms and signs involving cognitive function;Other symptoms and signs involving the nervous system (R29.898);Muscle weakness (generalized) (M62.81)   Activity Tolerance Patient tolerated treatment well   Patient Left in bed;with call bell/phone within reach   Nurse Communication Mobility status        Time: 3244-0102 OT Time Calculation (min): 14 min  Charges: OT General Charges $OT Visit: 1 Visit OT Treatments $Self Care/Home Management : 8-22 mins  Lanier Clam., COTA/L Acute Rehabilitation Services [phone] [place] 01/07/2020, 8:49 AM

## 2020-01-07 NOTE — Discharge Instructions (Signed)
Seizure, Adult °A seizure is a sudden burst of abnormal electrical activity in the brain. Seizures usually last from 30 seconds to 2 minutes. They can cause many different symptoms. °Usually, seizures are not harmful unless they last a long time. °What are the causes? °Common causes of this condition include: °· Fever or infection. °· Conditions that affect the brain, such as: °? A brain abnormality that you were born with. °? A brain or head injury. °? Bleeding in the brain. °? A tumor. °? Stroke. °? Brain disorders such as autism or cerebral palsy. °· Low blood sugar. °· Conditions that are passed from parent to child (are inherited). °· Problems with substances, such as: °? Having a reaction to a drug or a medicine. °? Suddenly stopping the use of a substance (withdrawal). °In some cases, the cause may not be known. A person who has repeated seizures over time without a clear cause has a condition called epilepsy. °What increases the risk? °You are more likely to get this condition if you have: °· A family history of epilepsy. °· Had a seizure in the past. °· A brain disorder. °· A history of head injury, lack of oxygen at birth, or strokes. °What are the signs or symptoms? °There are many types of seizures. The symptoms vary depending on the type of seizure you have. Examples of symptoms during a seizure include: °· Shaking (convulsions). °· Stiffness in the body. °· Passing out (losing consciousness). °· Head nodding. °· Staring. °· Not responding to sound or touch. °· Loss of bladder control and bowel control. °Some people have symptoms right before and right after a seizure happens. °Symptoms before a seizure may include: °· Fear. °· Worry (anxiety). °· Feeling like you may vomit (nauseous). °· Feeling like the room is spinning (vertigo). °· Feeling like you saw or heard something before (déjà vu). °· Odd tastes or smells. °· Changes in how you see. You may see flashing lights or spots. °Symptoms after a  seizure happens can include: °· Confusion. °· Sleepiness. °· Headache. °· Weakness on one side of the body. °How is this treated? °Most seizures will stop on their own in under 5 minutes. In these cases, no treatment is needed. Seizures that last longer than 5 minutes will usually need treatment. Treatment can include: °· Medicines given through an IV tube. °· Avoiding things that are known to cause your seizures. These can include medicines that you take for another condition. °· Medicines to treat epilepsy. °· Surgery to stop the seizures. This may be needed if medicines do not help. °Follow these instructions at home: °Medicines °· Take over-the-counter and prescription medicines only as told by your doctor. °· Do not eat or drink anything that may keep your medicine from working, such as alcohol. °Activity °· Do not do any activities that would be dangerous if you had another seizure, like driving or swimming. Wait until your doctor says it is safe for you to do them. °· If you live in the [place] ask your local DMV (department of motor vehicles) when you can drive. °· Get plenty of rest. °Teaching others °Teach friends and family what to do when you have a seizure. They should: °· Lay you on the ground. °· Protect your head and body. °· Loosen any tight clothing around your neck. °· Turn you on your side. °· Not hold you down. °· Not put anything into your mouth. °· Know whether or not you need emergency care. °· Stay   with you until you are better. ° °General instructions °· Contact your doctor each time you have a seizure. °· Avoid anything that gives you seizures. °· Keep a seizure diary. Write down: °? What you think caused each seizure. °? What you remember about each seizure. °· Keep all follow-up visits as told by your doctor. This is important. °Contact a doctor if: °· You have another seizure. °· You have seizures more often. °· There is any change in what happens during your seizures. °· You keep having  seizures with treatment. °· You have symptoms of being sick or having an infection. °Get help right away if: °· You have a seizure that: °? Lasts longer than 5 minutes. °? Is different than seizures you had before. °? Makes it harder to breathe. °? Happens after you hurt your head. °· You have any of these symptoms after a seizure: °? Not being able to speak. °? Not being able to use a part of your body. °? Confusion. °? A bad headache. °· You have two or more seizures in a row. °· You do not wake up right after a seizure. °· You get hurt during a seizure. °These symptoms may be an emergency. Do not wait to see if the symptoms will go away. Get medical help right away. Call your local emergency services (911 in the [place] Do not drive yourself to the hospital. °Summary °· Seizures usually last from 30 seconds to 2 minutes. Usually, they are not harmful unless they last a long time. °· Do not eat or drink anything that may keep your medicine from working, such as alcohol. °· Teach friends and family what to do when you have a seizure. °· Contact your doctor each time you have a seizure. °This information is not intended to replace advice given to you by your health care provider. Make sure you discuss any questions you have with your health care provider. °Document Revised: 05/11/2018 Document Reviewed: 05/11/2018 °[employer] Patient Education © 2020 [employer] ° °

## 2020-01-07 NOTE — Discharge Summary (Signed)
Physician Discharge Summary  HARRY BARK SLH:[id number] DOB: [birth year] DOA: 12/31/2019  PCP: System, Provider Not In  Admit date: 12/31/2019 Discharge date: 01/07/2020  Admitted From: Home Disposition: Home -Per [facility] statutes, patients with seizures are not allowed to drive until they have been seizure-free for six months. Use caution when using heavy equipment or power tools. Avoid working on ladders or at heights. Take showers instead of baths. Ensure the water temperature is not too high on the home water heater. Do not go swimming alone. Do not lock yourself in a room alone (i.e. bathroom). When caring for infants or small children, sit down when holding, feeding, or changing them to minimize risk of injury to the child in the event you have a seizure. Maintain good sleep hygiene. Avoid alcohol.  If patient has another seizure, call 911 and bring them back to the ED if: A.  The seizure lasts longer than 5 minutes.      B.  The patient doesn't wake shortly after the seizure or has new problems such as difficulty seeing, speaking or moving following the seizure C.  The patient was injured during the seizure D.  The patient has a temperature over 102 F (39C) E.  The patient vomited during the seizure and now is having trouble breathing    During the Seizure   - First, ensure adequate ventilation and place patients on the floor on their left side  Loosen clothing around the neck and ensure the airway is patent. If the patient is clenching the teeth, do not force the mouth open with any object as this can cause severe damage - Remove all items from the surrounding that can be hazardous. The patient may be oblivious to what's happening and may not even know what he or she is doing. If the patient is confused and wandering, either gently guide him/her away and block access to outside areas - Reassure the individual and be comforting - Call 911. In most cases, the seizure ends  before EMS arrives. However, there are cases when seizures may last over 3 to 5 minutes. Or the individual may have developed breathing difficulties or severe injuries. If a pregnant patient or a person with diabetes develops a seizure, it is prudent to call an ambulance. - Finally, if the patient does not regain full consciousness, then call EMS. Most patients will remain confused for about 45 to 90 minutes after a seizure, so you must use judgment in calling for help. - Avoid restraints but make sure the patient is in a bed with padded side rails - Place the individual in a lateral position with the neck slightly flexed; this will help the saliva drain from the mouth and prevent the tongue from falling backward - Remove all nearby furniture and other hazards from the area - Provide verbal assurance as the individual is regaining consciousness - Provide the patient with privacy if possible - Call for help and start treatment as ordered by the caregiver    After the Seizure (Postictal Stage)   After a seizure, most patients experience confusion, fatigue, muscle pain and/or a headache. Thus, one should permit the individual to sleep. For the next few days, reassurance is essential. Being calm and helping reorient the person is also of importance.   Most seizures are painless and end spontaneously. Seizures are not harmful to others but can lead to complications such as stress on the lungs, brain and the heart. Individuals with prior lung problems  may develop labored breathing and respiratory distress. Recommendations for Outpatient Follow-up:  1. Follow up with PCP in 1-2 weeks 2. Follow-up with neurology in 2 weeks, neurology to arrange that. 3. Please obtain BMP/CBC in one week 4. Please follow up with your PCP on the following pending results: Unresulted Labs (From admission, onward)          Start     Ordered   01/06/20 0500  CBC with Differential/Platelet  Daily,   R     Question:   Specimen collection method  Answer:  Lab=Lab collect   01/05/20 1247   01/04/20 1614  Mpo/pr-3 (anca) antibodies  (Anti-Neutrophilic Cystoplasmic Antibody Panel (PNL))  Once,   R       Question:  Specimen collection method  Answer:  Lab=Lab collect   01/04/20 1614   01/02/20 1208  Fungus Culture With Stain  Once,   R       Comments: Run on CSF and not on Blood   01/02/20 1208   12/31/19 1644  Triglycerides  (propofol (DIPRIVAN) infusion)  Every 72 hours,   R     Comments: while on propofol (DIPRIVAN)    12/31/19 1643           Home Health: None Equipment/Devices: None  Discharge Condition: Stable CODE STATUS: Full code Diet recommendation: Regular  Subjective: Seen and examined.  Mother at the bedside.  Patient seen and examined with neurologist Dr. Iver Nestle today.  Patient had no complaint.  Neurologist did complete neuro examination and cleared the patient for discharge.   Brief/Interim Summary:  Transient neuro deficit in the setting of severe headache: TIA versus seizure or complicated migraine: Seen by neurology MRI brain negative for acute stroke, scattered supra and infratentorial microhemorrhages, underwent a stroke work-up with LDL 111, 2D echo-LVEF 60 to 65%, normal diastolic parameters normal right ventricular function-no other acute findings, hemoglobin A1c 5.5.  Undergoing extensive evaluation by neuro, had EEG which also does not support epileptiform activity.  Has elevated CRP and ESR.  Neurology tried to do LP but failed 3 EEG going on..  Subsequently she underwent LP by IR for cells, glucose, protein, ACE, crypto antigen, fungal smear and further testing.  All testing is still pending.  She had slightly elevated white blood cells but normal glucose and slightly elevated protein.  She was started on acyclovir and cefepime by neurology.  Neuro recommended continuous EEG and repeat MRI but patient refused both of these and and then she agreed for continuous EEG and per neuro  note, it showed unusual pattern which was not clear and fell into a gray zone so she was started on Depakote 500 mg 3 times daily on 01/05/2020.  Neurology discontinued acyclovir.  I had a long discussion with Dr. Iver Nestle of neurology yesterday who recommended keeping her overnight for observation.  Patient was seen today by me and Dr. Iver Nestle together, after thorough physical examination done by neurologist, patient was cleared to go home with a strong recommendations to follow-up with neurology in 2 weeks as well as PCP and continuing Depakote as well as Topamax.  Acute agitation unclear etiology concern for withdrawal but seems to be taking methadone.  Intubated in the ED for airway protection but then was extubated.  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  Low-grade fever overnight 100.5/bacteremia: Last temperature on 01/01/2020.  She was started on vancomycin and cefepime.  Vancomycin was discontinued and cefepime was continued.  Only 1/4 bottle of  blood culture growing Propionibacterium.  She remained stable with no leukocytosis.  ID was consulted per neurology recommendation and ID recommended discontinuing antibiotics.  Hypokalemia :  3.4 today.  Was replaced orally before discharge.  Acute respiratory failure :needing intubation for agitation now extubated 10/27,   Thyroid nodule, left, will need ultrasound f/u as outpatient.  I personally asked her to follow-up with PCP and arrange outpatient ultrasound thyroid.  Possible seizure/migraine headache: She remains on Topamax.  Managed by neurology.  EEG results as below.  She was started on Depakote.  All of the above plan was clearly discussed with the patient and her mother was also at the bedside today.  Patient verbalized understanding and promised to follow recommendations and follow-up with PCP and neurology.  She was also clearly informed that legally she is not allowed to drive for at least 6 months  due to having history of seizure and that she needs a clearance from neurology before she starts driving.  Discharge Diagnoses:  Active Problems:   Acute left-sided weakness   [facility])    Discharge Instructions  Discharge Instructions    Ambulatory referral to Neurology   Complete by: As directed    An appointment is requested in approximately: 2 weeks     Allergies as of 01/07/2020      Reactions   Clarithromycin Anaphylaxis   Biaxin      Medication List    STOP taking these medications   chlorhexidine 0.12 % solution Commonly known as: PERIDEX   HYDROcodone-acetaminophen 5-325 MG tablet Commonly known as: NORCO/VICODIN   lidocaine 2 % solution Commonly known as: XYLOCAINE     TAKE these medications   divalproex 500 MG DR tablet Commonly known as: DEPAKOTE Take 1 tablet (500 mg total) by mouth every 8 (eight) hours.   methadone 10 MG/5ML solution Commonly known as: DOLOPHINE Take 115 mg by mouth daily.   Nexplanon 68 MG Impl implant Generic drug: etonogestrel 1 each by Subdermal route once.   topiramate 50 MG tablet Commonly known as: TOPAMAX Take 1 tablet (50 mg total) by mouth 2 (two) times daily.       Follow-up Information    [facility]. Follow up in 2 week(s).   Contact information: [address] [place] [address] [phone]              Allergies  Allergen Reactions  . Clarithromycin Anaphylaxis    Biaxin    Consultations: Neurology, PCCM and ID   Procedures/Studies: CT Code Stroke CTA Head W/WO contrast  Result Date: 12/31/2019 CLINICAL DATA:  Code stroke follow-up, left weakness and neglect EXAM: CT ANGIOGRAPHY HEAD AND NECK CT PERFUSION BRAIN TECHNIQUE: Multidetector CT imaging of the head and neck was performed using the standard protocol during bolus administration of intravenous contrast. Multiplanar CT image reconstructions and MIPs were obtained to evaluate the vascular anatomy.  Carotid stenosis measurements (when applicable) are obtained utilizing NASCET criteria, using the distal internal carotid diameter as the denominator. Multiphase CT imaging of the brain was performed following IV bolus contrast injection. Subsequent parametric perfusion maps were calculated using RAPID software. CONTRAST:  120mL OMNIPAQUE IOHEXOL 350 MG/ML SOLN COMPARISON:  None. FINDINGS: Suboptimal arterial contrast bolus timing. CTA NECK FINDINGS Aortic arch: Great vessel origins are patent. Right carotid system: Patent. No measurable stenosis or evidence of dissection. Left carotid system: Patent. No measurable stenosis or evidence of dissection. Vertebral arteries: Patent. Right vertebral artery slightly dominant. No measurable stenosis or  evidence of dissection. Skeleton: No significant abnormality. Other neck: There is a 1.3 cm nodule of the left thyroid. Upper chest: Partially imaged bilateral dependent atelectasis/consolidation. Review of the MIP images confirms the above findings CTA HEAD FINDINGS Anterior circulation: Intracranial internal carotid arteries are patent. Anterior and middle cerebral arteries are patent. Posterior circulation: Intracranial right vertebral artery is patent. There is diminished visualization of the mid left intracranial vertebral artery. Basilar artery is patent. Posterior cerebral arteries are patent. A right posterior communicating artery is present. Venous sinuses: As permitted by contrast timing, patent. Review of the MIP images confirms the above findings CT Brain Perfusion Findings: CBF (<30%) Volume: 0 mL, 5 mL right parietal lobe with less stringent threshold Perfusion (Tmax>6.0s) volume: 0 mL Mismatch Volume: 0 mL Infarction Location: None. IMPRESSION: No large vessel occlusion. Perfusion imaging demonstrates no evidence of core infarction or penumbra, although there may be some right parietal ischemia using less stringent threshold. Diminished visualization of mid  left intracranial vertebral artery, which may be related to small caliber vessel. 1.3 cm left thyroid nodule. Recommend nonemergent thyroid US follow-up by current guidelines. (Ref: J Am Coll Radiol. 2015 Feb;12(2): 143-50). Electronically Signed   By: Macy Mis M.D.   On: 12/31/2019 16:11   CT Code Stroke CTA Neck W/WO contrast  Result Date: 12/31/2019 CLINICAL DATA:  Code stroke follow-up, left weakness and neglect EXAM: CT ANGIOGRAPHY HEAD AND NECK CT PERFUSION BRAIN TECHNIQUE: Multidetector CT imaging of the head and neck was performed using the standard protocol during bolus administration of intravenous contrast. Multiplanar CT image reconstructions and MIPs were obtained to evaluate the vascular anatomy. Carotid stenosis measurements (when applicable) are obtained utilizing NASCET criteria, using the distal internal carotid diameter as the denominator. Multiphase CT imaging of the brain was performed following IV bolus contrast injection. Subsequent parametric perfusion maps were calculated using RAPID software. CONTRAST:  168mL OMNIPAQUE IOHEXOL 350 MG/ML SOLN COMPARISON:  None. FINDINGS: Suboptimal arterial contrast bolus timing. CTA NECK FINDINGS Aortic arch: Great vessel origins are patent. Right carotid system: Patent. No measurable stenosis or evidence of dissection. Left carotid system: Patent. No measurable stenosis or evidence of dissection. Vertebral arteries: Patent. Right vertebral artery slightly dominant. No measurable stenosis or evidence of dissection. Skeleton: No significant abnormality. Other neck: There is a 1.3 cm nodule of the left thyroid. Upper chest: Partially imaged bilateral dependent atelectasis/consolidation. Review of the MIP images confirms the above findings CTA HEAD FINDINGS Anterior circulation: Intracranial internal carotid arteries are patent. Anterior and middle cerebral arteries are patent. Posterior circulation: Intracranial right vertebral artery is patent.  There is diminished visualization of the mid left intracranial vertebral artery. Basilar artery is patent. Posterior cerebral arteries are patent. A right posterior communicating artery is present. Venous sinuses: As permitted by contrast timing, patent. Review of the MIP images confirms the above findings CT Brain Perfusion Findings: CBF (<30%) Volume: 0 mL, 5 mL right parietal lobe with less stringent threshold Perfusion (Tmax>6.0s) volume: 0 mL Mismatch Volume: 0 mL Infarction Location: None. IMPRESSION: No large vessel occlusion. Perfusion imaging demonstrates no evidence of core infarction or penumbra, although there may be some right parietal ischemia using less stringent threshold. Diminished visualization of mid left intracranial vertebral artery, which may be related to small caliber vessel. 1.3 cm left thyroid nodule. Recommend nonemergent thyroid US follow-up by current guidelines. (Ref: J Am Coll Radiol. 2015 Feb;12(2): 143-50). Electronically Signed   By: Macy Mis M.D.   On: 12/31/2019 16:11   MR BRAIN  WO CONTRAST  Result Date: 12/31/2019 CLINICAL DATA:  Stroke, follow-up. EXAM: MRI HEAD WITHOUT CONTRAST TECHNIQUE: Multiplanar, multiecho pulse sequences of the brain and surrounding structures were obtained without intravenous contrast. COMPARISON:  Noncontrast head CT and CT angiogram head/neck performed earlier the same day 12/31/2019. FINDINGS: Brain: Cerebral volume is normal. There are scattered supratentorial and infratentorial chronic microhemorrhages which are greater than expected for age and nonspecific. There is no acute infarct. No evidence of intracranial mass. No extra-axial fluid collection. No midline shift. Vascular: Expected proximal arterial flow voids. Skull and upper cervical spine: No focal marrow lesion. Sinuses/Orbits: Visualized orbits show no acute finding. Mild paranasal sinus mucosal thickening greatest within the ethmoid, right sphenoid and right maxillary sinuses.  No significant mastoid effusion. IMPRESSION: No evidence of acute intracranial abnormality, including acute infarction. Scattered supratentorial and infratentorial chronic microhemorrhages which are greater than expected for age and nonspecific. Mild paranasal sinus mucosal thickening. Electronically Signed   By: Kellie Simmering DO   On: 12/31/2019 21:43   IR Fluoro Guide Ndl Plmt / BX  Result Date: 01/03/2020 CLINICAL DATA:  29 year old female referred for lumbar puncture EXAM: DIAGNOSTIC LUMBAR PUNCTURE UNDER FLUOROSCOPIC GUIDANCE FLUOROSCOPY TIME:  Fluoroscopy Time:  0 minutes 12 seconds MEDICATIONS: 0.5 mg Versed, 50 mcg fentanyl. 15 minutes sedation time for moderate sedation The patient was monitored during the procedure by the interventional radiology nursing staff and staff position PROCEDURE: Informed consent was obtained from the patient prior to the procedure, including potential complications of headache, allergy, and pain. With the patient prone, the lower back was prepped with Betadine. 1% Lidocaine was used for local anesthesia. Lumbar puncture was performed at the L4-L5 level using a 20 gauge needle with return of clear CSF with an opening pressure of 12 cm water. Ten ml of CSF were obtained for laboratory studies. The patient tolerated the procedure well and there were no apparent complications. IMPRESSION: Fluoro guided LP. Electronically Signed   By: Corrie Mckusick D.O.   On: 01/03/2020 17:10   CT Code Stroke Cerebral Perfusion with contrast  Result Date: 12/31/2019 CLINICAL DATA:  Code stroke follow-up, left weakness and neglect EXAM: CT ANGIOGRAPHY HEAD AND NECK CT PERFUSION BRAIN TECHNIQUE: Multidetector CT imaging of the head and neck was performed using the standard protocol during bolus administration of intravenous contrast. Multiplanar CT image reconstructions and MIPs were obtained to evaluate the vascular anatomy. Carotid stenosis measurements (when applicable) are obtained  utilizing NASCET criteria, using the distal internal carotid diameter as the denominator. Multiphase CT imaging of the brain was performed following IV bolus contrast injection. Subsequent parametric perfusion maps were calculated using RAPID software. CONTRAST:  168mL OMNIPAQUE IOHEXOL 350 MG/ML SOLN COMPARISON:  None. FINDINGS: Suboptimal arterial contrast bolus timing. CTA NECK FINDINGS Aortic arch: Great vessel origins are patent. Right carotid system: Patent. No measurable stenosis or evidence of dissection. Left carotid system: Patent. No measurable stenosis or evidence of dissection. Vertebral arteries: Patent. Right vertebral artery slightly dominant. No measurable stenosis or evidence of dissection. Skeleton: No significant abnormality. Other neck: There is a 1.3 cm nodule of the left thyroid. Upper chest: Partially imaged bilateral dependent atelectasis/consolidation. Review of the MIP images confirms the above findings CTA HEAD FINDINGS Anterior circulation: Intracranial internal carotid arteries are patent. Anterior and middle cerebral arteries are patent. Posterior circulation: Intracranial right vertebral artery is patent. There is diminished visualization of the mid left intracranial vertebral artery. Basilar artery is patent. Posterior cerebral arteries are patent. A right posterior communicating artery  is present. Venous sinuses: As permitted by contrast timing, patent. Review of the MIP images confirms the above findings CT Brain Perfusion Findings: CBF (<30%) Volume: 0 mL, 5 mL right parietal lobe with less stringent threshold Perfusion (Tmax>6.0s) volume: 0 mL Mismatch Volume: 0 mL Infarction Location: None. IMPRESSION: No large vessel occlusion. Perfusion imaging demonstrates no evidence of core infarction or penumbra, although there may be some right parietal ischemia using less stringent threshold. Diminished visualization of mid left intracranial vertebral artery, which may be related to  small caliber vessel. 1.3 cm left thyroid nodule. Recommend nonemergent thyroid US follow-up by current guidelines. (Ref: J Am Coll Radiol. 2015 Feb;12(2): 143-50). Electronically Signed   By: Macy Mis M.D.   On: 12/31/2019 16:11   DG Chest Port 1 View  Result Date: 01/01/2020 CLINICAL DATA:  Stroke EXAM: PORTABLE CHEST 1 VIEW COMPARISON:  Yesterday FINDINGS: Endotracheal tube with tip at the clavicular heads. The enteric tube at least reaches the stomach. The lungs are clear. Normal heart size and mediastinal contours. IMPRESSION: 1. Unremarkable hardware. 2. Clear lungs. Electronically Signed   By: Monte Fantasia M.D.   On: 01/01/2020 07:58   DG Chest Port 1 View  Result Date: 12/31/2019 CLINICAL DATA:  Stroke, endotracheal tube EXAM: PORTABLE CHEST 1 VIEW COMPARISON:  2015 FINDINGS: Endotracheal tube is approximately 3 cm above the carina. Lungs are clear. No pleural effusion or pneumothorax. Normal heart size for technique. IMPRESSION: Endotracheal tube 3 cm above the carina. Electronically Signed   By: Macy Mis M.D.   On: 12/31/2019 15:33   DG Abd Portable 1V  Result Date: 12/31/2019 CLINICAL DATA:  OG tube placement EXAM: PORTABLE ABDOMEN - 1 VIEW COMPARISON:  CT abdomen pelvis 05/29/2013 FINDINGS: Please note this is not a full view of the chest or abdomen. Transesophageal tube tip terminates at the level of the gastric body within the left upper quadrant with side port distal to the GE junction. No high-grade obstructive bowel gas pattern is seen. Lung bases are clear. Additional support devices overlie the chest. Osseous structures are unremarkable. IMPRESSION: Transesophageal tube tip terminates at the level of the gastric body within the left upper quadrant with side port distal to the GE junction. Electronically Signed   By: Lovena Le M.D.   On: 12/31/2019 20:21   EEG adult  Result Date: 01/02/2020 Lora Havens, MD     01/05/2020  7:57 AM Patient Name: HELEN WINTERHALTER MRN: [id number] Epilepsy Attending: Lora Havens Referring Physician/Provider: Burnetta Sabin Date: 01/01/2020 Duration: 22.32 mins  Patient history: 29 year old female with history of IV drug use presented with left hemianopsia and neglect. EEG to evaluate for seizure.  Level of alertness: awake  AEDs during EEG study: Lorazepam, topiramate  Technical aspects: This EEG study was done with scalp electrodes positioned according to the 10-20 International system of electrode placement. Electrical activity was acquired at a sampling rate of 500Hz  and reviewed with a high frequency filter of 70Hz  and a low frequency filter of 1Hz . EEG data were recorded continuously and digitally stored.  Description: EEG showed continuous rhythmic 2-3hz  delta slowing as well 6-9hz  theta-alpha activity in right hemisphere. Hyperventilation and photic stimulation were not performed.    ABNORMALITY - Continuous rhythmic delta slow,  Lateralized right hemisphere - Continuous slow, generalized  IMPRESSION: This study showed lateralized rhythmic delta activity in right hemisphere ( LRDA) which is on the ictal-interictal continuum with low potential for seizures and can also be seen secondary to  underlying structural abnormality, post-ictal state.  Additionally, there is evidence of moderate diffuse encephalopathy, nonspecific etiology. No definite seizures or epileptiform discharges were seen throughout the recording.   Lora Havens   EEG adult  Result Date: 01/01/2020 Lora Havens, MD     01/01/2020  8:36 AM Patient Name: DIAMANTINA EDINGER MRN: [id number] Epilepsy Attending: Lora Havens Referring Physician/Provider: Dr. Kathrynn Speed Date: 12/31/2019 Duration: 23.17 minutes Patient history: 29 year old female with history of IV drug use presented with left hemianopsia and neglect.  EEG to evaluate for seizure. Level of alertness: Comatose AEDs during EEG study: Propofol, Versed, phenobarbital Technical  aspects: This EEG study was done with scalp electrodes positioned according to the 10-20 International system of electrode placement. Electrical activity was acquired at a sampling rate of 500Hz  and reviewed with a high frequency filter of 70Hz  and a low frequency filter of 1Hz . EEG data were recorded continuously and digitally stored. Description: EEG showed burst suppression pattern with 5-6 seconds of EEG suppression alternating with 2 to 3seconds polymorphic 8-15 Hz alpha-beta activity. Hyperventilation and photic stimulation were not performed.   ABNORMALITY -Burst suppression, generalized IMPRESSION: This study is suggestive of profound diffuse encephalopathy, nonspecific etiology but likely related to sedation.  No seizures or epileptiform discharges were seen throughout the recording. Priyanka Barbra Sarks   Overnight EEG with video  Result Date: 01/05/2020 Lora Havens, MD     01/06/2020  9:29 AM Patient Name:Deania Ernst Bowler QQV:[id number] Epilepsy Attending:Priyanka Barbra Sarks Referring Physician/Provider:Dr Roland Rack Duration: 01/04/2020 1509 to 01/05/2020 1509  Patient history:29 year old female with history of IV drug use presented with left hemianopsia and neglect. EEG to evaluate for seizure.  Level of alertness:awake, asleep  AEDs during EEG study:Topiramate  Technical aspects: This EEG study was done with scalp electrodes positioned according to the 10-20 International system of electrode placement. Electrical activity was acquired at a sampling rate of 500Hz  and reviewed with a high frequency filter of 70Hz  and a low frequency filter of 1Hz . EEG data were recorded continuously and digitally stored.  Description: No clear posterior dominant rhythm was seen.Sleep was characterized by sleep spindles (12-14hz ) maximal frontocentral region. EEG showed intermittent rhythmic high amplitude sharply contoured 2-3hz  delta slowing as well as intemittent generalized rhythmic 2-3hz  delta  slowing. Hyperventilation and photic stimulation were not performed.  Of note, parts of eeg were difficult to interpret around 6am on 01/05/2020 due to significant electrode artifact.  ABNORMALITY - Interittent rhythmic delta slow,  generalized and Lateralized right hemisphere  IMPRESSION: This studyshowed lateralized rhythmic delta activity in right hemisphere ( LRDA) which is on the ictal-interictal continuum with low potential for seizures and can also be seen secondary to underlying structural abnormality, post-ictal state.  Additionally, there is evidence of moderate diffuse encephalopathy, nonspecific etiology. No definite seizures or epileptiform discharges were seen throughout the recording.   Lora Havens  ECHOCARDIOGRAM COMPLETE  Result Date: 01/01/2020    ECHOCARDIOGRAM REPORT   Patient Name:   KEISHIA GROUND Date of Exam: 01/01/2020 Medical Rec #:  [id number]     Height:       65.0 in Accession #:    [id number]    Weight:       190.3 lb Date of Birth:  [birth year]     BSA:          1.937 m Patient Age:    29 years      BP:  91/58 mmHg Patient Gender: F             HR:           58 bpm. Exam Location:  Inpatient Procedure: 2D Echo, Cardiac Doppler and Color Doppler Indications:    CVA  History:        Patient has no prior history of Echocardiogram examinations.                 Stroke, Signs/Symptoms:Fever; Risk Factors:Current Smoker. IV                 drug abuse, headache, Resp. failure, sepsis.  Sonographer:    Lavenia Atlas Referring Phys: [id number] Vinnie Level SMITH IMPRESSIONS  1. Left ventricular ejection fraction, by estimation, is 60 to 65%. The left ventricle has normal function. The left ventricle has no regional wall motion abnormalities. Left ventricular diastolic parameters were normal.  2. Right ventricular systolic function is normal. The right ventricular size is normal. Tricuspid regurgitation signal is inadequate for assessing PA pressure.  3. The mitral valve is  normal in structure. Trivial mitral valve regurgitation. No evidence of mitral stenosis.  4. The aortic valve is tricuspid. Aortic valve regurgitation is not visualized. No aortic stenosis is present. Comparison(s): No prior Echocardiogram. Conclusion(s)/Recommendation(s): Normal biventricular function without evidence of hemodynamically significant valvular heart disease. No intracardiac source of embolism detected on this transthoracic study. A transesophageal echocardiogram is recommended to exclude cardiac source of embolism if clinically indicated. FINDINGS  Left Ventricle: Left ventricular ejection fraction, by estimation, is 60 to 65%. The left ventricle has normal function. The left ventricle has no regional wall motion abnormalities. The left ventricular internal cavity size was normal in size. There is  no left ventricular hypertrophy. Left ventricular diastolic parameters were normal. Right Ventricle: The right ventricular size is normal. No increase in right ventricular wall thickness. Right ventricular systolic function is normal. Tricuspid regurgitation signal is inadequate for assessing PA pressure. Left Atrium: Left atrial size was normal in size. Right Atrium: Right atrial size was normal in size. Pericardium: Trivial pericardial effusion is present. Mitral Valve: The mitral valve is normal in structure. Trivial mitral valve regurgitation. No evidence of mitral valve stenosis. Tricuspid Valve: The tricuspid valve is normal in structure. Tricuspid valve regurgitation is trivial. No evidence of tricuspid stenosis. Aortic Valve: The aortic valve is tricuspid. Aortic valve regurgitation is not visualized. No aortic stenosis is present. Pulmonic Valve: The pulmonic valve was not well visualized. Pulmonic valve regurgitation is not visualized. No evidence of pulmonic stenosis. Aorta: The aortic root, ascending aorta and aortic arch are all structurally normal, with no evidence of dilitation or  obstruction. Venous: IVC assessment for right atrial pressure unable to be performed due to mechanical ventilation. IAS/Shunts: No atrial level shunt detected by color flow Doppler.  LEFT VENTRICLE PLAX 2D LVIDd:         4.70 cm  Diastology LVIDs:         3.00 cm  LV e' medial:    8.81 cm/s LV PW:         0.90 cm  LV E/e' medial:  10.6 LV IVS:        1.10 cm  LV e' lateral:   10.80 cm/s LVOT diam:     2.00 cm  LV E/e' lateral: 8.6 LV SV:         75 LV SV Index:   39 LVOT Area:     3.14 cm  RIGHT VENTRICLE RV  Basal diam:  2.70 cm RV S prime:     10.10 cm/s TAPSE (M-mode): 2.8 cm LEFT ATRIUM             Index       RIGHT ATRIUM           Index LA diam:        3.40 cm 1.76 cm/m  RA Area:     14.00 cm LA Vol (A2C):   36.3 ml 18.74 ml/m RA Volume:   33.80 ml  17.45 ml/m LA Vol (A4C):   46.4 ml 23.96 ml/m LA Biplane Vol: 43.3 ml 22.36 ml/m  AORTIC VALVE LVOT Vmax:   106.00 cm/s LVOT Vmean:  69.400 cm/s LVOT VTI:    0.238 m  AORTA Ao Root diam: 2.90 cm MITRAL VALVE MV Area (PHT): 3.03 cm    SHUNTS MV Decel Time: 250 msec    Systemic VTI:  0.24 m MV E velocity: 93.40 cm/s  Systemic Diam: 2.00 cm MV A velocity: 54.00 cm/s MV E/A ratio:  1.73 Buford Dresser MD Electronically signed by Buford Dresser MD Signature Date/Time: 01/01/2020/12:24:20 PM    Final    CT HEAD CODE STROKE WO CONTRAST  Result Date: 12/31/2019 CLINICAL DATA:  Code stroke. EXAM: CT HEAD WITHOUT CONTRAST TECHNIQUE: Contiguous axial images were obtained from the base of the skull through the vertex without intravenous contrast. COMPARISON:  None. FINDINGS: Brain: There is no acute intracranial hemorrhage, mass effect, or edema. Gray-white differentiation is preserved. Ventricles and sulci are normal in size and configuration. There is no extra-axial collection. Vascular: No hyperdense vessel. Skull: Unremarkable. Sinuses/Orbits: Minor mucosal thickening.  Orbits are unremarkable. Other: Mastoid air cells are clear. ASPECTS ([place]  Stroke Program Early CT Score) - Ganglionic level infarction (caudate, lentiform nuclei, internal capsule, insula, M1-M3 cortex): 7 - Supraganglionic infarction (M4-M6 cortex): 3 Total score (0-10 with 10 being normal): 10 IMPRESSION: There is no acute intracranial hemorrhage or evidence of acute infarction. ASPECT score is 10. These results were communicated to Dr. Leonel Ramsay at 3:43 pm on 12/31/2019 by text page via the [facility] messaging system. Electronically Signed   By: Macy Mis M.D.   On: 12/31/2019 15:49      Discharge Exam: Vitals:   01/06/20 2038 01/07/20 0604  BP: (!) 100/55 117/66  Pulse: 80 76  Resp: 19 15  Temp: 97.7 F (36.5 C) 97.9 F (36.6 C)  SpO2: 98% 99%   Vitals:   01/06/20 0850 01/06/20 1249 01/06/20 2038 01/07/20 0604  BP: 115/70 107/69 (!) 100/55 117/66  Pulse: 86 74 80 76  Resp: $Remo'18 18 19 15  'KlvJT$ Temp: 98.8 F (37.1 C) (!) 97.5 F (36.4 C) 97.7 F (36.5 C) 97.9 F (36.6 C)  TempSrc: Oral Oral Oral Oral  SpO2: 97% 97% 98% 99%  Weight:    79.4 kg  Height:        General: Pt is alert, awake, not in acute distress Cardiovascular: RRR, S1/S2 +, no rubs, no gallops Respiratory: CTA bilaterally, no wheezing, no rhonchi Abdominal: Soft, NT, ND, bowel sounds + Extremities: no edema, no cyanosis    The results of significant diagnostics from this hospitalization (including imaging, microbiology, ancillary and laboratory) are listed below for reference.     Microbiology: Recent Results (from the past 240 hour(s))  Respiratory Panel by RT PCR (Flu A&B, Covid) - Nasopharyngeal Swab     Status: None   Collection Time: 12/31/19  3:25 PM   Specimen: Nasopharyngeal Swab  Result Value  Ref Range Status   SARS Coronavirus 2 by RT PCR NEGATIVE NEGATIVE Final    Comment: (NOTE) SARS-CoV-2 target nucleic acids are NOT DETECTED.  The SARS-CoV-2 RNA is generally detectable in upper respiratoy specimens during the acute phase of infection. The lowest concentration  of SARS-CoV-2 viral copies this assay can detect is 131 copies/mL. A negative result does not preclude SARS-Cov-2 infection and should not be used as the sole basis for treatment or other patient management decisions. A negative result may occur with  improper specimen collection/handling, submission of specimen other than nasopharyngeal swab, presence of viral mutation(s) within the areas targeted by this assay, and inadequate number of viral copies (<131 copies/mL). A negative result must be combined with clinical observations, patient history, and epidemiological information. The expected result is Negative.  Fact Sheet for Patients:  https://www.moore.com/  Fact Sheet for Healthcare Providers:  https://www.young.biz/  This test is no t yet approved or cleared by the [place] FDA and  has been authorized for detection and/or diagnosis of SARS-CoV-2 by FDA under an Emergency Use Authorization (EUA). This EUA will remain  in effect (meaning this test can be used) for the duration of the COVID-19 declaration under Section 564(b)(1) of the Act, 21 U.S.C. section 360bbb-3(b)(1), unless the authorization is terminated or revoked sooner.     Influenza A by PCR NEGATIVE NEGATIVE Final   Influenza B by PCR NEGATIVE NEGATIVE Final    Comment: (NOTE) The Xpert Xpress SARS-CoV-2/FLU/RSV assay is intended as an aid in  the diagnosis of influenza from Nasopharyngeal swab specimens and  should not be used as a sole basis for treatment. Nasal washings and  aspirates are unacceptable for Xpert Xpress SARS-CoV-2/FLU/RSV  testing.  Fact Sheet for Patients: https://www.moore.com/  Fact Sheet for Healthcare Providers: https://www.young.biz/  This test is not yet approved or cleared by the [place] FDA and  has been authorized for detection and/or diagnosis of SARS-CoV-2 by  FDA under an Emergency Use  Authorization (EUA). This EUA will remain  in effect (meaning this test can be used) for the duration of the  Covid-19 declaration under Section 564(b)(1) of the Act, 21  U.S.C. section 360bbb-3(b)(1), unless the authorization is  terminated or revoked. Performed at [facility], [address], [place], [place] [address]   Culture, blood (routine x 2)     Status: Abnormal   Collection Time: 12/31/19  4:15 PM   Specimen: BLOOD  Result Value Ref Range Status   Specimen Description BLOOD SITE NOT SPECIFIED  Final   Special Requests   Final    BOTTLES DRAWN AEROBIC AND ANAEROBIC Blood Culture adequate volume   Culture  Setup Time   Final    GRAM POSITIVE RODS ANAEROBIC BOTTLE ONLY CRITICAL RESULT CALLED TO, READ BACK BY AND VERIFIED WITH: PHARMD M. PHAN 1302 [id number] FCP    Culture (A)  Final    PROPIONIBACTERIUM ACNES Standardized susceptibility testing for this organism is not available. Performed at [facility], [address] [place], [place] [address]    Report Status 01/06/2020 FINAL  Final  Blood Culture ID Panel (Reflexed)     Status: None   Collection Time: 12/31/19  4:15 PM  Result Value Ref Range Status   Enterococcus faecalis NOT DETECTED NOT DETECTED Final   Enterococcus Faecium NOT DETECTED NOT DETECTED Final   Listeria monocytogenes NOT DETECTED NOT DETECTED Final   Staphylococcus species NOT DETECTED NOT DETECTED Final   Staphylococcus aureus (BCID) NOT  DETECTED NOT DETECTED Final   Staphylococcus epidermidis NOT DETECTED NOT DETECTED Final   Staphylococcus lugdunensis NOT DETECTED NOT DETECTED Final   Streptococcus species NOT DETECTED NOT DETECTED Final   Streptococcus agalactiae NOT DETECTED NOT DETECTED Final   Streptococcus pneumoniae NOT DETECTED NOT DETECTED Final   Streptococcus pyogenes NOT DETECTED NOT DETECTED Final   A.calcoaceticus-baumannii NOT DETECTED NOT DETECTED Final   Bacteroides fragilis NOT DETECTED NOT DETECTED Final    Enterobacterales NOT DETECTED NOT DETECTED Final   Enterobacter cloacae complex NOT DETECTED NOT DETECTED Final   Escherichia coli NOT DETECTED NOT DETECTED Final   Klebsiella aerogenes NOT DETECTED NOT DETECTED Final   Klebsiella oxytoca NOT DETECTED NOT DETECTED Final   Klebsiella pneumoniae NOT DETECTED NOT DETECTED Final   Proteus species NOT DETECTED NOT DETECTED Final   Salmonella species NOT DETECTED NOT DETECTED Final   Serratia marcescens NOT DETECTED NOT DETECTED Final   Haemophilus influenzae NOT DETECTED NOT DETECTED Final   Neisseria meningitidis NOT DETECTED NOT DETECTED Final   Pseudomonas aeruginosa NOT DETECTED NOT DETECTED Final   Stenotrophomonas maltophilia NOT DETECTED NOT DETECTED Final   Candida albicans NOT DETECTED NOT DETECTED Final   Candida auris NOT DETECTED NOT DETECTED Final   Candida glabrata NOT DETECTED NOT DETECTED Final   Candida krusei NOT DETECTED NOT DETECTED Final   Candida parapsilosis NOT DETECTED NOT DETECTED Final   Candida tropicalis NOT DETECTED NOT DETECTED Final   Cryptococcus neoformans/gattii NOT DETECTED NOT DETECTED Final    Comment: Performed at [facility], [address], [place], [place] [address]  Culture, blood (routine x 2)     Status: None   Collection Time: 12/31/19  4:36 PM   Specimen: BLOOD  Result Value Ref Range Status   Specimen Description BLOOD SITE NOT SPECIFIED  Final   Special Requests   Final    BOTTLES DRAWN AEROBIC ONLY Blood Culture results may not be optimal due to an inadequate volume of blood received in culture bottles   Culture   Final    NO GROWTH 5 DAYS Performed at [facility], [place] [address], [place], [place] [address]    Report Status 01/05/2020 FINAL  Final  MRSA PCR Screening     Status: None   Collection Time: 12/31/19  7:13 PM   Specimen: Nasal Mucosa; Nasopharyngeal  Result Value Ref Range Status   MRSA by PCR NEGATIVE NEGATIVE Final    Comment:        The GeneXpert MRSA  Assay (FDA approved for NASAL specimens only), is one component of a comprehensive MRSA colonization surveillance program. It is not intended to diagnose MRSA infection nor to guide or monitor treatment for MRSA infections. Performed at [facility], [place] [address], [place], [place] [address]   Urine culture     Status: None   Collection Time: 12/31/19  8:28 PM   Specimen: Urine, Random  Result Value Ref Range Status   Specimen Description URINE, RANDOM  Final   Special Requests NONE  Final   Culture   Final    NO GROWTH Performed at [facility], [place] [address] [place], [place] [address]    Report Status 01/02/2020 FINAL  Final  CSF culture     Status: None   Collection Time: 01/02/20 12:08 PM   Specimen: CSF; Cerebrospinal Fluid  Result Value Ref Range Status   Specimen Description CSF  Final   Special Requests NONE  Final  Gram Stain   Final    WBC PRESENT, PREDOMINANTLY MONONUCLEAR RED BLOOD CELLS PRESENT NO ORGANISMS SEEN    Culture   Final    NO GROWTH 3 DAYS Performed at [facility], [address], [place], [place] [address]    Report Status 01/07/2020 FINAL  Final     Labs: BNP (last 3 results) No results for input(s): BNP in the last 8760 hours. Basic Metabolic Panel: Recent Labs  Lab 01/01/20 0124 01/01/20 0124 01/02/20 0034 01/03/20 0244 01/05/20 0418 01/06/20 1217 01/07/20 0154  NA 139   < > 140 137 138 136 137  K 3.4*   < > 3.0* 4.0 3.3* 3.2* 3.4*  CL 109   < > 107 107 106 105 105  CO2 23   < > 23 21* 21* 22 20*  GLUCOSE 118*   < > 94 105* 98 110* 92  BUN <5*   < > <5* $Rem'6 9 10 13  'UzYI$ CREATININE 0.62   < > 0.64 0.74 0.81 0.77 0.81  CALCIUM 8.4*   < > 8.8* 9.7 10.0 9.7 9.8  MG 1.8  --  1.8 2.0  --   --   --   PHOS 3.2  --   --   --   --   --   --    < > = values in this interval not displayed.   Liver Function Tests: Recent Labs  Lab 12/31/19 1300 01/03/20 0244  AST 19 33  ALT 24 21  ALKPHOS 50 44  BILITOT 0.6 0.9   PROT 7.8 7.8  ALBUMIN 4.3 4.0   No results for input(s): LIPASE, AMYLASE in the last 168 hours. No results for input(s): AMMONIA in the last 168 hours. CBC: Recent Labs  Lab 12/31/19 1300 12/31/19 1304 01/01/20 0124 01/03/20 0244 01/05/20 0418 01/06/20 1217 01/07/20 0154  WBC 7.9   < > 8.1 6.0 5.6 6.7 5.4  NEUTROABS 5.6  --   --  3.2 2.3 3.4 2.4  HGB 13.6   < > 12.5 13.1 14.8 13.6 13.8  HCT 40.2   < > 37.5 38.0 43.3 39.5 40.3  MCV 96.6   < > 96.6 94.8 95.4 94.5 94.4  PLT 263   < > 201 261 306 289 277   < > = values in this interval not displayed.   Cardiac Enzymes: No results for input(s): CKTOTAL, CKMB, CKMBINDEX, TROPONINI in the last 168 hours. BNP: Invalid input(s): POCBNP CBG: Recent Labs  Lab 12/31/19 1258 12/31/19 2316  GLUCAP 137* 137*   D-Dimer No results for input(s): DDIMER in the last 72 hours. Hgb A1c No results for input(s): HGBA1C in the last 72 hours. Lipid Profile Recent Labs    01/06/20 1855  TRIG 115   Thyroid function studies No results for input(s): TSH, T4TOTAL, T3FREE, THYROIDAB in the last 72 hours.  Invalid input(s): FREET3 Anemia work up No results for input(s): VITAMINB12, FOLATE, FERRITIN, TIBC, IRON, RETICCTPCT in the last 72 hours. Urinalysis    Component Value Date/Time   COLORURINE STRAW (A) 12/31/2019 2025   APPEARANCEUR CLEAR 12/31/2019 2025   LABSPEC 1.035 (H) 12/31/2019 2025   PHURINE 6.0 12/31/2019 2025   GLUCOSEU NEGATIVE 12/31/2019 2025   HGBUR NEGATIVE 12/31/2019 2025   BILIRUBINUR NEGATIVE 12/31/2019 2025   KETONESUR NEGATIVE 12/31/2019 2025   PROTEINUR NEGATIVE 12/31/2019 2025   UROBILINOGEN 0.2 03/26/2015 1445   NITRITE NEGATIVE 12/31/2019 2025   LEUKOCYTESUR NEGATIVE 12/31/2019 2025   Sepsis Labs  Invalid input(s): PROCALCITONIN,  WBC,  LACTICIDVEN Microbiology Recent Results (from the past 240 hour(s))  Respiratory Panel by RT PCR (Flu A&B, Covid) - Nasopharyngeal Swab     Status: None   Collection  Time: 12/31/19  3:25 PM   Specimen: Nasopharyngeal Swab  Result Value Ref Range Status   SARS Coronavirus 2 by RT PCR NEGATIVE NEGATIVE Final    Comment: (NOTE) SARS-CoV-2 target nucleic acids are NOT DETECTED.  The SARS-CoV-2 RNA is generally detectable in upper respiratoy specimens during the acute phase of infection. The lowest concentration of SARS-CoV-2 viral copies this assay can detect is 131 copies/mL. A negative result does not preclude SARS-Cov-2 infection and should not be used as the sole basis for treatment or other patient management decisions. A negative result may occur with  improper specimen collection/handling, submission of specimen other than nasopharyngeal swab, presence of viral mutation(s) within the areas targeted by this assay, and inadequate number of viral copies (<131 copies/mL). A negative result must be combined with clinical observations, patient history, and epidemiological information. The expected result is Negative.  Fact Sheet for Patients:  PinkCheek.be  Fact Sheet for Healthcare Providers:  GravelBags.it  This test is no t yet approved or cleared by the [place] FDA and  has been authorized for detection and/or diagnosis of SARS-CoV-2 by FDA under an Emergency Use Authorization (EUA). This EUA will remain  in effect (meaning this test can be used) for the duration of the COVID-19 declaration under Section 564(b)(1) of the Act, 21 U.S.C. section 360bbb-3(b)(1), unless the authorization is terminated or revoked sooner.     Influenza A by PCR NEGATIVE NEGATIVE Final   Influenza B by PCR NEGATIVE NEGATIVE Final    Comment: (NOTE) The Xpert Xpress SARS-CoV-2/FLU/RSV assay is intended as an aid in  the diagnosis of influenza from Nasopharyngeal swab specimens and  should not be used as a sole basis for treatment. Nasal washings and  aspirates are unacceptable for Xpert Xpress  SARS-CoV-2/FLU/RSV  testing.  Fact Sheet for Patients: PinkCheek.be  Fact Sheet for Healthcare Providers: GravelBags.it  This test is not yet approved or cleared by the [place] FDA and  has been authorized for detection and/or diagnosis of SARS-CoV-2 by  FDA under an Emergency Use Authorization (EUA). This EUA will remain  in effect (meaning this test can be used) for the duration of the  Covid-19 declaration under Section 564(b)(1) of the Act, 21  U.S.C. section 360bbb-3(b)(1), unless the authorization is  terminated or revoked. Performed at [facility], [place] [address] [place], [place] [address]   Culture, blood (routine x 2)     Status: Abnormal   Collection Time: 12/31/19  4:15 PM   Specimen: BLOOD  Result Value Ref Range Status   Specimen Description BLOOD SITE NOT SPECIFIED  Final   Special Requests   Final    BOTTLES DRAWN AEROBIC AND ANAEROBIC Blood Culture adequate volume   Culture  Setup Time   Final    GRAM POSITIVE RODS ANAEROBIC BOTTLE ONLY CRITICAL RESULT CALLED TO, READ BACK BY AND VERIFIED WITH: PHARMD M. PHAN 1302 [id number] FCP    Culture (A)  Final    PROPIONIBACTERIUM ACNES Standardized susceptibility testing for this organism is not available. Performed at [facility], [place] [address] [place], [place] [address]    Report Status 01/06/2020 FINAL  Final  Blood Culture ID Panel (Reflexed)     Status: None   Collection Time: 12/31/19  4:15  PM  Result Value Ref Range Status   Enterococcus faecalis NOT DETECTED NOT DETECTED Final   Enterococcus Faecium NOT DETECTED NOT DETECTED Final   Listeria monocytogenes NOT DETECTED NOT DETECTED Final   Staphylococcus species NOT DETECTED NOT DETECTED Final   Staphylococcus aureus (BCID) NOT DETECTED NOT DETECTED Final   Staphylococcus epidermidis NOT DETECTED NOT DETECTED Final   Staphylococcus lugdunensis NOT DETECTED NOT DETECTED Final    Streptococcus species NOT DETECTED NOT DETECTED Final   Streptococcus agalactiae NOT DETECTED NOT DETECTED Final   Streptococcus pneumoniae NOT DETECTED NOT DETECTED Final   Streptococcus pyogenes NOT DETECTED NOT DETECTED Final   A.calcoaceticus-baumannii NOT DETECTED NOT DETECTED Final   Bacteroides fragilis NOT DETECTED NOT DETECTED Final   Enterobacterales NOT DETECTED NOT DETECTED Final   Enterobacter cloacae complex NOT DETECTED NOT DETECTED Final   Escherichia coli NOT DETECTED NOT DETECTED Final   Klebsiella aerogenes NOT DETECTED NOT DETECTED Final   Klebsiella oxytoca NOT DETECTED NOT DETECTED Final   Klebsiella pneumoniae NOT DETECTED NOT DETECTED Final   Proteus species NOT DETECTED NOT DETECTED Final   Salmonella species NOT DETECTED NOT DETECTED Final   Serratia marcescens NOT DETECTED NOT DETECTED Final   Haemophilus influenzae NOT DETECTED NOT DETECTED Final   Neisseria meningitidis NOT DETECTED NOT DETECTED Final   Pseudomonas aeruginosa NOT DETECTED NOT DETECTED Final   Stenotrophomonas maltophilia NOT DETECTED NOT DETECTED Final   Candida albicans NOT DETECTED NOT DETECTED Final   Candida auris NOT DETECTED NOT DETECTED Final   Candida glabrata NOT DETECTED NOT DETECTED Final   Candida krusei NOT DETECTED NOT DETECTED Final   Candida parapsilosis NOT DETECTED NOT DETECTED Final   Candida tropicalis NOT DETECTED NOT DETECTED Final   Cryptococcus neoformans/gattii NOT DETECTED NOT DETECTED Final    Comment: Performed at [facility], [address], [place], [place] [address]  Culture, blood (routine x 2)     Status: None   Collection Time: 12/31/19  4:36 PM   Specimen: BLOOD  Result Value Ref Range Status   Specimen Description BLOOD SITE NOT SPECIFIED  Final   Special Requests   Final    BOTTLES DRAWN AEROBIC ONLY Blood Culture results may not be optimal due to an inadequate volume of blood received in culture bottles   Culture   Final    NO GROWTH 5  DAYS Performed at [facility], [place] [address], [place], [place] [address]    Report Status 01/05/2020 FINAL  Final  MRSA PCR Screening     Status: None   Collection Time: 12/31/19  7:13 PM   Specimen: Nasal Mucosa; Nasopharyngeal  Result Value Ref Range Status   MRSA by PCR NEGATIVE NEGATIVE Final    Comment:        The GeneXpert MRSA Assay (FDA approved for NASAL specimens only), is one component of a comprehensive MRSA colonization surveillance program. It is not intended to diagnose MRSA infection nor to guide or monitor treatment for MRSA infections. Performed at [facility], [place] [address], [place], [place] [address]   Urine culture     Status: None   Collection Time: 12/31/19  8:28 PM   Specimen: Urine, Random  Result Value Ref Range Status   Specimen Description URINE, RANDOM  Final   Special Requests NONE  Final   Culture   Final    NO GROWTH Performed at [facility], [place] [address], [place],  [address]  Report Status 01/02/2020 FINAL  Final  CSF culture     Status: None   Collection Time: 01/02/20 12:08 PM   Specimen: CSF; Cerebrospinal Fluid  Result Value Ref Range Status   Specimen Description CSF  Final   Special Requests NONE  Final   Gram Stain   Final    WBC PRESENT, PREDOMINANTLY MONONUCLEAR RED BLOOD CELLS PRESENT NO ORGANISMS SEEN    Culture   Final    NO GROWTH 3 DAYS Performed at [facility], [place] [address] [place], [place] [address]    Report Status 01/07/2020 FINAL  Final     Time coordinating discharge: Over 30 minutes  SIGNED:   Darliss Cheney, MD  [facility] 01/07/2020, 9:48 AM  If 7PM-7AM, please contact night-coverage [email or web address]

## 2020-01-07 NOTE — Progress Notes (Addendum)
Subjective/Interval events: - Depakote 500 mg TID started 10/31 after 3000 mg load at 10:30 AM - Patient reports that she feels better, headache continues to improve  - Mother at bedside confirms no intermittent issues on the left side throughout the day she spent with her daughter - Denies any significant side effects from Depakote including excessive sleepiness    Exam:     Vitals:    01/05/20 2100 01/06/20 0500  BP: 117/80 119/80  Pulse: 84 89  Resp: 18 18  Temp: 98.2 F (36.8 C) 98 F (36.7 C)  SpO2: 100% 99%    Gen: In bed, NAD Psych: somewhat flat, but intermittently smiles Resp: non-labored breathing, no acute distress Abd: soft, nt   Neuro: MS: Awake, alert,  on today's exam patient has slow thought processing when trying to follow commands.  She also shows decreased visual awareness with finger copying. CN: Pupils equal round and reactive, she crosses midline in both directions, visual fields are full, no photophobia, symmetric smile, tongue midline, uvula elevates symmetrically Motor:  On confrontational strength testing she is 5 out of 5 in the bilateral upper extremities Sensory: She endorses is symmetric to sensation in bilateral arms and legs and does not extinguish to double simultaneous stimuli anymore Gait: Standing on heels and toes within normal limits, tandem gait normal, walking normal   Pertinent Labs: General labs Cr 0.81 UDS + for opiates and benzos(both given in ED) Blood cultures drawn 10/26 initially negative; grew PROPIONIBACTERIUM ACNES   CSF: 10/28 Single tube counts only WBC 14, 97% lymphs RBC 73 Protein 60 Glucose 66 HSV 1/2 negative  Fungal culture NGT  ACE-less than 1.5 VRDL nonreactive Pending -  ENC1 may[facility] panel Depakote level as of 01/06/2020 was 66.    Rheum labs ESR (40) , CRP (5.8) -elevated, though in the setting of recent intubation low-grade fever, of unclear significance ANA - negative ANCA negative SSA/SSB-negative RF,  SSA/SSB - pending ENA (Antiextractable Nuclear Ag) - negative   EEG: Continuous EEG monitoring 01/05/2020 1509 to 2200 notable for high amplitude right-sided rhythmic delta activity at times with a small spike noted before slow activity   Impression: 29 year old female with focal right parietal deficits with mounting evidence for seizure disorder, given waxing and waning symptoms and negative MRI imaging.  The question is what to make of her CSF pleocytosis, which is higher than would be expected from seizures alone, and could alternatively reflect a partially treated CNS bacterial infection given LP was obtained 2 days after antibiotics were initiated. Reassuringly, this speciated as Propionibacterium and is likely a contaminant per ID. In addition, she has been stable off of cefepime for the past day. Patient still showing subtle dysfunction with right parietal lobe function which I suspect is post-ictal Todds'.  Given her valproic acid level of 66, there is room to go up further on this medication if needed on an outpatient basis;   Discussed with patient that she does live with her grandfather and her daughter who is 52 years old and feels as though she has good support at home.  Patient's mother is aware at that people who live with the patient should continue to monitor for recurrent or worsening signs given that the patient's ability to recognize her own deficits may be limited given the nature of right parietal lesions   Recommendations: - continue Depakote 500 3 times daily,  there is room to increase on an outpatient basis if needed given her level of 66 at this  time - continue topamax -Discussed with patient that she is to not drive for 6 months and will need a driving evaluation prior to driving given spatial confusion -We will need outpatient follow-up with Dr. Delice Lesch as she did have seizures on presentation.

## 2020-01-10 LAB — MPO/PR-3 (ANCA) ANTIBODIES
ANCA Proteinase 3: <3.5 U/mL (ref 0.0–3.5)
Myeloperoxidase Abs: <9 U/mL (ref 0.0–9.0)

## 2020-01-14 ENCOUNTER — Other Ambulatory Visit (INDEPENDENT_AMBULATORY_CARE_PROVIDER_SITE_OTHER): Payer: Medicaid Other

## 2020-01-14 ENCOUNTER — Ambulatory Visit: Payer: Medicaid Other | Admitting: Neurology

## 2020-01-14 ENCOUNTER — Other Ambulatory Visit: Payer: Self-pay

## 2020-01-14 ENCOUNTER — Encounter: Payer: Self-pay | Admitting: Neurology

## 2020-01-14 VITALS — BP 112/68 | HR 73 | Resp 18 | Ht 65.0 in | Wt 185.0 lb

## 2020-01-14 DIAGNOSIS — M79604 Pain in right leg: Secondary | ICD-10-CM

## 2020-01-14 DIAGNOSIS — R569 Unspecified convulsions: Secondary | ICD-10-CM

## 2020-01-14 DIAGNOSIS — G43009 Migraine without aura, not intractable, without status migrainosus: Secondary | ICD-10-CM | POA: Diagnosis not present

## 2020-01-14 DIAGNOSIS — M79605 Pain in left leg: Secondary | ICD-10-CM | POA: Diagnosis not present

## 2020-01-14 LAB — C-REACTIVE PROTEIN: CRP: 1.4 mg/dL (ref 0.5–20.0)

## 2020-01-14 LAB — SEDIMENTATION RATE: Sed Rate: 18 mm/hr (ref 0–20)

## 2020-01-14 MED ORDER — TOPIRAMATE 50 MG PO TABS: 50.0000 mg | ORAL_TABLET | Freq: Two times a day (BID) | ORAL | 1 refills | Status: DC

## 2020-01-14 MED ORDER — DIVALPROEX SODIUM 500 MG PO DR TAB: DELAYED_RELEASE_TABLET | ORAL | 11 refills | Status: DC

## 2020-01-14 NOTE — Patient Instructions (Addendum)
1. Schedule 1-hour EEG  2. Bloodwork for ESR, CRP  3. Take the Depakote $RemoveBefo'500mg'raocqdZbEQu$ : 1 tablet in AM, 2 tablets in PM  4. Continue Topamax $RemoveBeforeDE'50mg'eWBqjVdlHhpxuaJ$ : take 1 tablet twice a day  5. As per  driving laws, no driving until 6 months event-free  6. Follow-up in 3-4 months, call for any changes   Seizure Precautions: 1. If medication has been prescribed for you to prevent seizures, take it exactly as directed.  Do not stop taking the medicine without talking to your doctor first, even if you have not had a seizure in a long time.   2. Avoid activities in which a seizure would cause danger to yourself or to others.  Don't operate dangerous machinery, swim alone, or climb in high or dangerous places, such as on ladders, roofs, or girders.  Do not drive unless your doctor says you may.  3. If you have any warning that you may have a seizure, lay down in a safe place where you can't hurt yourself.    4.  No driving for 6 months from last seizure, as per [facility].   Please refer to the following link on the [place] website for more information: http://www.epilepsyfoundation.org/answerplace/Social/driving/drivingu.cfm   5.  Maintain good sleep hygiene. Avoid alcohol.  6.  Notify your neurology if you are planning pregnancy or if you become pregnant.  7.  Contact your doctor if you have any problems that may be related to the medicine you are taking.  8.  Call 911 and bring the patient back to the ED if:        A.  The seizure lasts longer than 5 minutes.       B.  The patient doesn't awaken shortly after the seizure  C.  The patient has new problems such as difficulty seeing, speaking or moving  D.  The patient was injured during the seizure  E.  The patient has a temperature over 102 F (39C)  F.  The patient vomited and now is having trouble breathing        Your provider has requested that you have labwork completed today. Please go to [facility]  ([id number]) on the second floor of this building before leaving the office today. You do not need to check in. If you are not called within 15 minutes please check with the front desk.
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
## 2020-01-15 ENCOUNTER — Telehealth: Payer: Self-pay

## 2020-01-15 NOTE — Telephone Encounter (Signed)
-----   Message from Cameron Sprang, MD sent at 01/15/2020 10:07 AM EST ----- Pls let  her know bloodwork was normal, thanks

## 2020-01-15 NOTE — Telephone Encounter (Signed)
Pt called unable to leave a voice mail  

## 2020-01-22 ENCOUNTER — Ambulatory Visit: Payer: Medicaid Other | Admitting: Neurology

## 2020-01-22 ENCOUNTER — Other Ambulatory Visit: Payer: Self-pay

## 2020-01-22 DIAGNOSIS — R569 Unspecified convulsions: Secondary | ICD-10-CM

## 2020-01-23 ENCOUNTER — Telehealth: Payer: Self-pay | Admitting: Neurology

## 2020-01-23 NOTE — Telephone Encounter (Signed)
Patient called and said, "Will you please check with Dr. Delice Lesch? I'd like to see if I can go back to work for a few days a week."

## 2020-01-24 NOTE — Telephone Encounter (Signed)
She can go back to work but should not drive until 6 months seizure-free. How are her legs feeling? Thanks

## 2020-01-24 NOTE — Telephone Encounter (Signed)
Pt called and informed She can go back to work but should not drive until 6 months seizure-free. Pt stated that her legs are ok, she has only had one headache, she said when she goes to work she is going back slow only a few days a week not everyday

## 2020-02-03 ENCOUNTER — Telehealth: Payer: Self-pay | Admitting: Neurology

## 2020-02-03 MED ORDER — TOPIRAMATE 50 MG PO TABS: 75.0000 mg | ORAL_TABLET | Freq: Two times a day (BID) | ORAL | 1 refills | Status: DC

## 2020-02-03 NOTE — Telephone Encounter (Signed)
Let pt know that Dr Delice Lesch out of the office for next few weeks.  I don't see EEG results in system so i'm guessing not read and Dr. Delice Lesch can give results when back.  If having more headaches, she can try and increase her topamax to 75 mg twice per day (currently 50 mg twice per day) - so will take 1.5 tabs twice per day now.

## 2020-02-03 NOTE — Procedures (Signed)
ELECTROENCEPHALOGRAM REPORT  Date of Study: 01/22/2020  Patient's Name: Amber [name] MRN: [id number] Date of Birth: 05-Jan-1991  Referring Provider: Dr. Ellouise Newer  Clinical History: This is a 29 year old woman with new onset seizure in October 2021 preceded by severe migraine and confusion, then left sided symptoms.   Medications: Depakote Topamax Methadone  Technical Summary: A multichannel digital 1-hour EEG recording measured by the international 10-20 system with electrodes applied with paste and impedances below 5000 ohms performed in our laboratory with EKG monitoring in an awake and asleep patient.  Hyperventilation was not performed. Photic stimulation was performed.  The digital EEG was referentially recorded, reformatted, and digitally filtered in a variety of bipolar and referential montages for optimal display.    Description: The patient is awake and asleep during the recording.  During maximal wakefulness, there is a symmetric, medium voltage 8 Hz posterior dominant rhythm that attenuates with eye opening.  The record is symmetric.  During drowsiness and sleep, there is an increase in theta slowing of the background.  Vertex waves and symmetric sleep spindles were seen.  Photic stimulation did not elicit any abnormalities.  There were no epileptiform discharges or electrographic seizures seen.    EKG lead was unremarkable.  Impression: This 1-hour awake and asleep EEG is normal.    Clinical Correlation: A normal EEG does not exclude a clinical diagnosis of epilepsy.  If further clinical questions remain, prolonged EEG may be helpful.  Clinical correlation is advised.   Ellouise Newer, M.D.

## 2020-02-03 NOTE — Telephone Encounter (Signed)
Patient called in stating she is having migraines more than normal lately. She woke up with one this morning. She did take her topamax and 2 depakote late around 1AM. Her arm and leg went numb around 9 AM it seems to be getting better now.

## 2020-02-03 NOTE — Telephone Encounter (Signed)
Pt called she stated that she is doing better no numbness. She stated that she has had head ache off an on past few days they are not  as bad as her normal head aches. She is also asking about her EEG results?

## 2020-02-03 NOTE — Telephone Encounter (Signed)
Pls let her know the EEG was normal, much improved from when she was in the hospital. Agree with increasing Topamax dose. Thanks

## 2020-02-03 NOTE — Telephone Encounter (Signed)
Pt called and informed that Dr Delice Lesch out of the office for next few weeks. Will call pt bak with EEG results when Dr Delice Lesch returns.  If having more headaches, she can try and increase her topamax to 75 mg twice per day (currently 50 mg twice per day) - so will take 1.5 tabs twice per day now. Pt verbalized understanding will send in a new script for increase in topamax

## 2020-02-04 LAB — FUNGAL ORGANISM REFLEX

## 2020-02-04 LAB — FUNGUS CULTURE WITH STAIN

## 2020-02-04 LAB — FUNGUS CULTURE RESULT

## 2020-02-04 NOTE — Telephone Encounter (Signed)
LVM for pt to call back.

## 2020-02-05 NOTE — Telephone Encounter (Signed)
Pt called and informed that EEG was normal, much improved from when she was in the hospital

## 2020-03-03 ENCOUNTER — Other Ambulatory Visit: Payer: Self-pay | Admitting: Neurology

## 2020-04-15 ENCOUNTER — Encounter: Payer: Self-pay | Admitting: Neurology

## 2020-04-15 ENCOUNTER — Ambulatory Visit: Payer: Medicaid Other | Admitting: Neurology

## 2020-04-15 ENCOUNTER — Other Ambulatory Visit: Payer: Self-pay

## 2020-04-15 VITALS — BP 115/64 | HR 74 | Resp 181 | Ht 66.0 in | Wt 180.0 lb

## 2020-04-15 DIAGNOSIS — R569 Unspecified convulsions: Secondary | ICD-10-CM

## 2020-04-15 DIAGNOSIS — G43009 Migraine without aura, not intractable, without status migrainosus: Secondary | ICD-10-CM

## 2020-04-15 MED ORDER — TOPIRAMATE 50 MG PO TABS: ORAL_TABLET | ORAL | 3 refills | Status: DC

## 2020-04-15 MED ORDER — DIVALPROEX SODIUM 500 MG PO DR TAB
DELAYED_RELEASE_TABLET | ORAL | 3 refills | Status: DC
Start: 2020-04-15 — End: 2020-11-11

## 2020-04-15 NOTE — Patient Instructions (Signed)
Great to see you! Continue all your medications. Follow-up in 5-6 months, call for any changes   Seizure Precautions: 1. If medication has been prescribed for you to prevent seizures, take it exactly as directed.  Do not stop taking the medicine without talking to your doctor first, even if you have not had a seizure in a long time.   2. Avoid activities in which a seizure would cause danger to yourself or to others.  Don't operate dangerous machinery, swim alone, or climb in high or dangerous places, such as on ladders, roofs, or girders.  Do not drive unless your doctor says you may.  3. If you have any warning that you may have a seizure, lay down in a safe place where you can't hurt yourself.    4.  No driving for 6 months from last seizure, as per [facility].   Please refer to the following link on the [place] website for more information: http://www.epilepsyfoundation.org/answerplace/Social/driving/drivingu.cfm   5.  Maintain good sleep hygiene. Avoid alcohol.  6.  Notify your neurology if you are planning pregnancy or if you become pregnant.  7.  Contact your doctor if you have any problems that may be related to the medicine you are taking.  8.  Call 911 and bring the patient back to the ED if:        A.  The seizure lasts longer than 5 minutes.       B.  The patient doesn't awaken shortly after the seizure  C.  The patient has new problems such as difficulty seeing, speaking or moving  D.  The patient was injured during the seizure  E.  The patient has a temperature over 102 F (39C)  F.  The patient vomited and now is having trouble breathing
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
## 2020-07-15 ENCOUNTER — Encounter: Payer: Self-pay | Admitting: Advanced Practice Midwife

## 2020-07-15 ENCOUNTER — Other Ambulatory Visit: Payer: Self-pay

## 2020-07-15 ENCOUNTER — Other Ambulatory Visit (HOSPITAL_COMMUNITY)
Admission: RE | Admit: 2020-07-15 | Discharge: 2020-07-15 | Disposition: A | Discharge status: Home / Self Care | Payer: Medicaid Other | Source: Ambulatory Visit | Attending: Advanced Practice Midwife | Admitting: Advanced Practice Midwife

## 2020-07-15 ENCOUNTER — Ambulatory Visit (INDEPENDENT_AMBULATORY_CARE_PROVIDER_SITE_OTHER): Payer: Medicaid Other | Admitting: Advanced Practice Midwife

## 2020-07-15 VITALS — BP 128/81 | HR 65 | Ht 65.0 in | Wt 188.0 lb

## 2020-07-15 DIAGNOSIS — Z01419 Encounter for gynecological examination (general) (routine) without abnormal findings: Secondary | ICD-10-CM | POA: Diagnosis not present

## 2020-07-15 DIAGNOSIS — Z0000 Encounter for general adult medical examination without abnormal findings: Secondary | ICD-10-CM | POA: Diagnosis not present

## 2020-07-15 NOTE — Progress Notes (Addendum)
   WELL-WOMAN EXAMINATION Patient name: Amber [name] MRN [id number]  Date of birth: [birth year] Chief Complaint:   Gynecologic Exam  History of Present Illness:   Amber [name] is a 30 y.o. G42P1011 Caucasian female being seen today for a routine well-woman exam.  Current complaints: none; doing well; having monthly cycles with Nexplanon (placed 07/20/2018); sexually active every once in awhile  Depression screen [facility] 2/9 07/15/2020  Decreased Interest 0  Down, Depressed, Hopeless 0  PHQ - 2 Score 0  Altered sleeping 2  Tired, decreased energy 2  Change in appetite 0  Feeling bad or failure about yourself  0  Trouble concentrating 0  Moving slowly or fidgety/restless 0  Suicidal thoughts 0  PHQ-9 Score 4     PCP: looking for one; plans to call [facility])      does not desire labs Patient's last menstrual period was 06/29/2020 (approximate). The current method of family planning is Nexplanon.  Last pap unsure exactly. Results were: negative per pt report at [facility] (can't find records). H/O abnormal pap: no Last mammogram: had breast u/s in past. Results were: normal (fibroadenoma) Family h/o breast cancer: no Last colonoscopy: never. Results were: N/A. Family h/o colorectal cancer: no Review of Systems:   Pertinent items are noted in HPI Denies any headaches, blurred vision, fatigue, shortness of breath, chest pain, abdominal pain, abnormal vaginal discharge/itching/odor/irritation, problems with periods, bowel movements, urination, or intercourse unless otherwise stated above. Pertinent History Reviewed:  Reviewed past medical,surgical, social and family history.  Reviewed problem list, medications and allergies. Physical Assessment:   Vitals:   07/15/20 1507  BP: 128/81  Pulse: 65  Weight: 188 lb (85.3 kg)  Height: 5\' 5"  (1.651 m)  Body mass index is 31.28 kg/m.        Physical Examination:   General appearance - well appearing, and in no distress  Mental  status - alert, oriented to person, place, and time  Psych:  She has a normal mood and affect  Skin - warm and dry, normal color, no suspicious lesions noted  Chest - effort normal, all lung fields clear to auscultation bilaterally  Heart - normal rate and regular rhythm  Neck:  midline trachea, no thyromegaly or nodules  Breasts - breasts appear normal, no suspicious masses, no skin or nipple changes or  axillary nodes  Abdomen - soft, nontender, nondistended, no masses or organomegaly  Pelvic - VULVA: normal appearing vulva with no masses, tenderness or lesions  VAGINA: normal appearing vagina with normal color and discharge, no lesions  CERVIX: normal appearing cervix without discharge or lesions, no CMT  Thin prep pap is done with HR HPV cotesting  UTERUS: uterus is felt to be normal size, shape, consistency and nontender   ADNEXA: No adnexal masses or tenderness noted.  Extremities:  No swelling or varicosities noted  Chaperone: Levy Pupa    No results found for this or any previous visit (from the past 24 hour(s)).  Assessment & Plan:  1) Well-Woman Exam  2) Nexplanon in place, doing well  Labs/procedures today: Pap & GC/chlam  Mammogram: @ 32yo, or sooner if problems Colonoscopy: @ 32yo, or sooner if problems  No orders of the defined types were placed in this encounter.   Meds: No orders of the defined types were placed in this encounter.   Follow-up: Return for May 2023 Nexplanon removal & reinsertion; pls help w MyChart account.  Myrtis Ser CNM 07/15/2020 3:33 PM

## 2020-07-17 LAB — CYTOLOGY - PAP
Chlamydia: NEGATIVE
Comment: NEGATIVE
Comment: NEGATIVE
Comment: NORMAL
Diagnosis: NEGATIVE
High risk HPV: NEGATIVE
Neisseria Gonorrhea: NEGATIVE

## 2020-11-11 ENCOUNTER — Encounter: Payer: Self-pay | Admitting: Neurology

## 2020-11-11 ENCOUNTER — Ambulatory Visit: Payer: Medicaid Other | Admitting: Neurology

## 2020-11-11 ENCOUNTER — Other Ambulatory Visit: Payer: Self-pay

## 2020-11-11 VITALS — BP 114/73 | HR 66 | Ht 66.0 in | Wt 199.4 lb

## 2020-11-11 DIAGNOSIS — G43009 Migraine without aura, not intractable, without status migrainosus: Secondary | ICD-10-CM | POA: Diagnosis not present

## 2020-11-11 DIAGNOSIS — R569 Unspecified convulsions: Secondary | ICD-10-CM

## 2020-11-11 DIAGNOSIS — R400 Somnolence: Secondary | ICD-10-CM

## 2020-11-11 MED ORDER — TOPIRAMATE 50 MG PO TABS: ORAL_TABLET | ORAL | 3 refills | Status: DC

## 2020-11-11 MED ORDER — DIVALPROEX SODIUM 500 MG PO DR TAB
DELAYED_RELEASE_TABLET | ORAL | 3 refills | Status: DC
Start: 2020-11-11 — End: 2021-05-07
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
## 2020-11-11 NOTE — Patient Instructions (Signed)
Schedule home sleep study  2. Continue Depakote '500mg'$ : take 1 tab in AM, 2 tabs in PM, Topiramate '50mg'$ : take 1 and 1/2 tablets twice a day  3. Continue biotin  4. Follow-up in 6 months, call for any changes   Seizure Precautions: 1. If medication has been prescribed for you to prevent seizures, take it exactly as directed.  Do not stop taking the medicine without talking to your doctor first, even if you have not had a seizure in a long time.   2. Avoid activities in which a seizure would cause danger to yourself or to others.  Don't operate dangerous machinery, swim alone, or climb in high or dangerous places, such as on ladders, roofs, or girders.  Do not drive unless your doctor says you may.  3. If you have any warning that you may have a seizure, lay down in a safe place where you can't hurt yourself.    4.  No driving for 6 months from last seizure, as per [facility].   Please refer to the following link on the [place] website for more information: http://[email or web address]/[email or web address]/drivingu.cfm   5.  Maintain good sleep hygiene. Avoid alcohol.  6.  Notify your neurology if you are planning pregnancy or if you become pregnant.  7.  Contact your doctor if you have any problems that may be related to the medicine you are taking.  8.  Call 911 and bring the patient back to the ED if:        A.  The seizure lasts longer than 5 minutes.       B.  The patient doesn't awaken shortly after the seizure  C.  The patient has new problems such as difficulty seeing, speaking or moving  D.  The patient was injured during the seizure  E.  The patient has a temperature over 102 F (39C)  F.  The patient vomited and now is having trouble breathing

## 2020-12-15 ENCOUNTER — Ambulatory Visit (HOSPITAL_BASED_OUTPATIENT_CLINIC_OR_DEPARTMENT_OTHER): Payer: Medicaid Other | Attending: Neurology | Admitting: Internal Medicine

## 2021-01-14 ENCOUNTER — Encounter: Payer: Self-pay | Admitting: Neurology

## 2021-02-15 ENCOUNTER — Ambulatory Visit (HOSPITAL_BASED_OUTPATIENT_CLINIC_OR_DEPARTMENT_OTHER): Payer: Medicaid Other | Attending: Neurology | Admitting: Internal Medicine

## 2021-05-07 ENCOUNTER — Other Ambulatory Visit: Payer: Self-pay

## 2021-05-07 ENCOUNTER — Encounter: Payer: Self-pay | Admitting: Neurology

## 2021-05-07 ENCOUNTER — Ambulatory Visit: Payer: Medicaid Other | Admitting: Neurology

## 2021-05-07 VITALS — BP 103/70 | HR 59 | Resp 18 | Ht 66.0 in | Wt 191.0 lb

## 2021-05-07 DIAGNOSIS — R569 Unspecified convulsions: Secondary | ICD-10-CM | POA: Diagnosis not present

## 2021-05-07 DIAGNOSIS — G43009 Migraine without aura, not intractable, without status migrainosus: Secondary | ICD-10-CM | POA: Diagnosis not present

## 2021-05-07 MED ORDER — TOPIRAMATE 100 MG PO TABS: 100.0000 mg | ORAL_TABLET | Freq: Two times a day (BID) | ORAL | 3 refills | Status: DC

## 2021-05-07 MED ORDER — DIVALPROEX SODIUM 500 MG PO DR TAB: DELAYED_RELEASE_TABLET | ORAL | 3 refills | Status: DC

## 2021-05-07 NOTE — Patient Instructions (Signed)
Good to see you. ? ?Increase Topiramate to 100mg : take 1 tablet twice a day ? ?2. Continue Depakote 500mg : take 1 tablet in AM, 2 tablets in PM ? ?3. Please call the sleep center to schedule sleep study ? ?4. Follow-up in 4 months, call for any changes ? ? ?Seizure Precautions: ?1. If medication has been prescribed for you to prevent seizures, take it exactly as directed.  Do not stop taking the medicine without talking to your doctor first, even if you have not had a seizure in a long time.  ? ?2. Avoid activities in which a seizure would cause danger to yourself or to others.  Don't operate dangerous machinery, swim alone, or climb in high or dangerous places, such as on ladders, roofs, or girders.  Do not drive unless your doctor says you may. ? ?3. If you have any warning that you may have a seizure, lay down in a safe place where you can't hurt yourself.   ? ?4.  No driving for 6 months from last seizure, as per [facility].   Please refer to the following link on the [facility] website for more information: http://[email or web address].org/[email or web address]/driving/drivingu.cfm  ? ?5.  Maintain good sleep hygiene. Avoid alcohol. ? ?6.  Notify your neurology if you are planning pregnancy or if you become pregnant. ? ?7.  Contact your doctor if you have any problems that may be related to the medicine you are taking. ? ?8.  Call 911 and bring the patient back to the ED if: ?      ? A.  The seizure lasts longer than 5 minutes.      ? B.  The patient doesn't awaken shortly after the seizure ? C.  The patient has new problems such as difficulty seeing, speaking or moving ? D.  The patient was injured during the seizure ? E.  The patient has a temperature over 102 F (39C) ? F.  The patient vomited and now is having trouble breathing ?      ? ?
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
## 2021-06-08 ENCOUNTER — Ambulatory Visit: Payer: Medicaid Other | Admitting: Neurology

## 2021-06-09 ENCOUNTER — Ambulatory Visit: Payer: Medicaid Other | Admitting: Neurology

## 2021-09-06 ENCOUNTER — Ambulatory Visit: Payer: Medicaid Other | Admitting: Neurology

## 2021-09-23 ENCOUNTER — Ambulatory Visit: Payer: Medicaid Other | Admitting: Obstetrics & Gynecology

## 2021-09-30 ENCOUNTER — Encounter: Payer: Self-pay | Admitting: Obstetrics & Gynecology

## 2021-09-30 ENCOUNTER — Ambulatory Visit (INDEPENDENT_AMBULATORY_CARE_PROVIDER_SITE_OTHER): Payer: Medicaid Other | Admitting: Obstetrics & Gynecology

## 2021-09-30 VITALS — BP 141/81 | HR 69 | Ht 65.0 in | Wt 194.4 lb

## 2021-09-30 DIAGNOSIS — Z3202 Encounter for pregnancy test, result negative: Secondary | ICD-10-CM | POA: Diagnosis not present

## 2021-09-30 DIAGNOSIS — Z3046 Encounter for surveillance of implantable subdermal contraceptive: Secondary | ICD-10-CM | POA: Diagnosis not present

## 2021-09-30 DIAGNOSIS — Z30017 Encounter for initial prescription of implantable subdermal contraceptive: Secondary | ICD-10-CM

## 2021-09-30 LAB — POCT URINE PREGNANCY: Preg Test, Ur: NEGATIVE

## 2021-09-30 MED ORDER — ETONOGESTREL 68 MG ~~LOC~~ IMPL
68.0000 mg | DRUG_IMPLANT | Freq: Once | SUBCUTANEOUS | Status: AC
  Administered 2021-09-30: 68 mg via SUBCUTANEOUS

## 2021-09-30 NOTE — Progress Notes (Signed)
   Amber [name] Patient name: Amber [name] MRN [id number]  Date of birth: [birth year] Subjective Findings:   Amber [name] is a 31 y.o. G67P1011 female being seen today for Nexplanon removal and [name].  Her Nexplanon was placed 3 1/2 yrs ago.  Menses are typically regular each month.  Reports no acute concerns or problems with this device.   Patient's last menstrual period was 09/22/2021. Last pap 07/2020.   Risks/benefits/side effects of Nexplanon have been discussed and her questions have been answered.  She is aware of the common side effect of irregular bleeding, which the incidence of decreases over time. Signed copy of informed consent in chart.      07/15/2020    3:09 PM  Depression screen PHQ 2/9  Decreased Interest 0  Down, Depressed, Hopeless 0  PHQ - 2 Score 0  Altered sleeping 2  Tired, decreased energy 2  Change in appetite 0  Feeling bad or failure about yourself  0  Trouble concentrating 0  Moving slowly or fidgety/restless 0  Suicidal thoughts 0  PHQ-9 Score 4        07/15/2020    3:09 PM  GAD 7 : Generalized Anxiety Score  Nervous, Anxious, on Edge 0  Control/stop worrying 0  Worry too much - different things 0  Trouble relaxing 0  Restless 0  Easily annoyed or irritable 0  Afraid - awful might happen 0  Total GAD 7 Score 0     Pertinent History Reviewed:   Reviewed past medical,surgical, social, obstetrical and family history.  Reviewed problem list, medications and allergies. Objective Findings & Procedure:    Vitals:   09/30/21 1536  BP: (!) 141/81  Pulse: 69  Weight: 194 lb 6.4 oz (88.2 kg)  Height: '5\' 5"'$  (1.651 m)  Body mass index is 32.35 kg/m.  Results for orders placed or performed in visit on 09/30/21 (from the past 24 hour(s))  POCT urine pregnancy   Collection Time: 09/30/21  3:42 PM  Result Value Ref Range   Preg Test, Ur Negative Negative     Time out was performed.  Nexplanon site identified.   Area prepped in usual sterile fashon. Two cc's of 2% lidocaine was used to anesthetize the area. A small stab incision was made right beside the implant on the distal portion.  The Nexplanon rod was grasped using hemostats and removed intact without difficulty.  The area was cleansed again with betadine and the Nexplanon was inserted approximately 10cm from the medial epicondyle and 3-5cm posterior to the sulcus per manufacturer's recommendations without difficulty.  Steri-strips and a pressure bandage was applied.  There was less than 3 cc blood loss. There were no complications.  The patient tolerated the procedure well. Assessment & Plan:   1) Nexplanon removal & [name] She was instructed to keep the area clean and dry, remove pressure bandage in 24 hours, and keep insertion site covered with the steri-strips for 3-5 days.  She was given a card indicating date Nexplanon was inserted and date it needs to be removed.  Follow-up PRN problems.  Orders Placed This Encounter  Procedures   POCT urine pregnancy    Follow-up: Return in about 4 weeks (around 10/28/2021) for annual.  Janyth Pupa, DO Attending [place], [place] for [facility], [place]

## 2021-11-18 ENCOUNTER — Ambulatory Visit: Payer: Medicaid Other | Admitting: Adult Health

## 2021-12-16 ENCOUNTER — Ambulatory Visit (INDEPENDENT_AMBULATORY_CARE_PROVIDER_SITE_OTHER): Payer: Medicaid Other | Admitting: Adult Health

## 2021-12-16 ENCOUNTER — Encounter: Payer: Self-pay | Admitting: Adult Health

## 2021-12-16 VITALS — BP 98/62 | HR 64 | Ht 66.0 in | Wt 192.0 lb

## 2021-12-16 DIAGNOSIS — K429 Umbilical hernia without obstruction or gangrene: Secondary | ICD-10-CM

## 2021-12-16 DIAGNOSIS — Z1389 Encounter for screening for other disorder: Secondary | ICD-10-CM

## 2021-12-16 DIAGNOSIS — Z01419 Encounter for gynecological examination (general) (routine) without abnormal findings: Secondary | ICD-10-CM

## 2021-12-16 DIAGNOSIS — Z975 Presence of (intrauterine) contraceptive device: Secondary | ICD-10-CM | POA: Diagnosis not present

## 2021-12-16 DIAGNOSIS — F17200 Nicotine dependence, unspecified, uncomplicated: Secondary | ICD-10-CM

## 2021-12-16 NOTE — Progress Notes (Signed)
Patient ID: Amber [name], female   DOB: June 27, 1990, 31 y.o.   MRN: [id number] History of Present Illness: Amber [name] is a 31 year old white female,single, G2P1011, in for well woman gyn exam. She has some head congestion, using OTC meds. She says at home COVID test was negative.  Last pap was negative HPV and malignancy 07/15/21.    Current Medications, Allergies, Past Medical History, Past Surgical History, Family History and Social History were reviewed in [employer] record.     Review of Systems: Patient denies any headaches, hearing loss, fatigue, blurred vision, shortness of breath, chest pain, abdominal pain, problems with bowel movements, urination, or intercourse. No joint pain or mood swings.     Physical Exam:BP 98/62 (BP Location: Right Arm, Cuff Size: Normal)   Pulse 64   Ht '5\' 6"'$  (1.676 m)   Wt 192 lb (87.1 kg)   LMP 12/02/2021 (Approximate)   BMI 30.99 kg/m   General:  Well developed, well nourished, no acute distress Skin:  Warm and dry Neck:  Midline trachea, normal thyroid, good ROM, no lymphadenopathy Lungs; Clear to auscultation bilaterally Breast:  No dominant palpable mass, retraction, or nipple discharge Cardiovascular: Regular rate and rhythm Abdomen:  Soft, non tender, no hepatosplenomegaly,has small umbilical hernia Pelvic:  External genitalia is normal in appearance, no lesions.  The vagina is normal in appearance. Urethra has no lesions or masses. The cervix is bulbous.  Uterus is felt to be normal size, shape, and contour.  No adnexal masses or tenderness noted.Bladder is non tender, no masses felt. Extremities/musculoskeletal:  No swelling or varicosities noted, no clubbing or cyanosis Psych:  No mood changes, alert and cooperative,seems happy AA is 0 Fall risk is low    12/16/2021    9:19 AM 07/15/2020    3:09 PM  Depression screen PHQ 2/9  Decreased Interest 0 0  Down, Depressed, Hopeless 0 0  PHQ - 2 Score 0 0  Altered  sleeping 1 2  Tired, decreased energy 2 2  Change in appetite 0 0  Feeling bad or failure about yourself  0 0  Trouble concentrating 0 0  Moving slowly or fidgety/restless 0 0  Suicidal thoughts 0 0  PHQ-9 Score 3 4       12/16/2021    9:20 AM 07/15/2020    3:09 PM  GAD 7 : Generalized Anxiety Score  Nervous, Anxious, on Edge 1 0  Control/stop worrying 0 0  Worry too much - different things 0 0  Trouble relaxing 0 0  Restless 0 0  Easily annoyed or irritable 0 0  Afraid - awful might happen 0 0  Total GAD 7 Score 1 0    Upstream - 12/16/21 5093       Pregnancy Intention Screening   Does the patient want to become pregnant in the next year? No    Does the patient's partner want to become pregnant in the next year? No    Would the patient like to discuss contraceptive options today? No      Contraception Wrap Up   Current Method Hormonal Implant    End Method Hormonal Implant            Examination chaperoned by Levy Pupa LPN    Impression and Plan: 1. Encounter for well woman exam with routine gynecological exam Physical in 1 year Pap in 2025   2. Nexplanon in place Placed 09/30/21  3. Umbilical hernia without obstruction and without gangrene If  has pain, go to ER   4. Tobacco use disorder Try to decrease, she vaps too

## 2022-04-28 ENCOUNTER — Other Ambulatory Visit (HOSPITAL_COMMUNITY): Payer: Self-pay

## 2022-07-01 ENCOUNTER — Other Ambulatory Visit (HOSPITAL_COMMUNITY): Payer: Self-pay

## 2022-09-30 ENCOUNTER — Encounter (HOSPITAL_COMMUNITY): Payer: Self-pay

## 2022-09-30 ENCOUNTER — Other Ambulatory Visit: Payer: Self-pay

## 2022-09-30 ENCOUNTER — Inpatient Hospital Stay (HOSPITAL_COMMUNITY): Payer: MEDICAID

## 2022-09-30 ENCOUNTER — Emergency Department (HOSPITAL_COMMUNITY): Payer: MEDICAID

## 2022-09-30 ENCOUNTER — Inpatient Hospital Stay (HOSPITAL_COMMUNITY)
Admission: EM | Admit: 2022-09-30 | Discharge: 2022-10-02 | DRG: 193 | Disposition: A | Discharge status: Home / Self Care | Payer: MEDICAID | Attending: Internal Medicine | Admitting: Internal Medicine

## 2022-09-30 DIAGNOSIS — R0603 Acute respiratory distress: Secondary | ICD-10-CM

## 2022-09-30 DIAGNOSIS — R079 Chest pain, unspecified: Secondary | ICD-10-CM

## 2022-09-30 DIAGNOSIS — Z881 Allergy status to other antibiotic agents status: Secondary | ICD-10-CM

## 2022-09-30 DIAGNOSIS — E876 Hypokalemia: Secondary | ICD-10-CM | POA: Diagnosis not present

## 2022-09-30 DIAGNOSIS — J9601 Acute respiratory failure with hypoxia: Secondary | ICD-10-CM | POA: Diagnosis not present

## 2022-09-30 DIAGNOSIS — N151 Renal and perinephric abscess: Secondary | ICD-10-CM | POA: Diagnosis present

## 2022-09-30 DIAGNOSIS — F17290 Nicotine dependence, other tobacco product, uncomplicated: Secondary | ICD-10-CM | POA: Diagnosis not present

## 2022-09-30 DIAGNOSIS — E8809 Other disorders of plasma-protein metabolism, not elsewhere classified: Secondary | ICD-10-CM | POA: Diagnosis present

## 2022-09-30 DIAGNOSIS — J189 Pneumonia, unspecified organism: Secondary | ICD-10-CM | POA: Diagnosis present

## 2022-09-30 DIAGNOSIS — Z79899 Other long term (current) drug therapy: Secondary | ICD-10-CM | POA: Diagnosis not present

## 2022-09-30 DIAGNOSIS — M545 Low back pain, unspecified: Secondary | ICD-10-CM | POA: Diagnosis present

## 2022-09-30 DIAGNOSIS — G40909 Epilepsy, unspecified, not intractable, without status epilepticus: Secondary | ICD-10-CM | POA: Diagnosis present

## 2022-09-30 DIAGNOSIS — F1120 Opioid dependence, uncomplicated: Secondary | ICD-10-CM | POA: Diagnosis present

## 2022-09-30 DIAGNOSIS — G8929 Other chronic pain: Secondary | ICD-10-CM | POA: Diagnosis present

## 2022-09-30 DIAGNOSIS — R61 Generalized hyperhidrosis: Secondary | ICD-10-CM | POA: Diagnosis present

## 2022-09-30 LAB — ECHOCARDIOGRAM COMPLETE
AR max vel: 2.27 cm2
AV Area VTI: 2.27 cm2
AV Area mean vel: 2.26 cm2
AV Mean grad: 7 mmHg
AV Peak grad: 13.4 mmHg
Ao pk vel: 1.83 m/s
Area-P 1/2: 3.63 cm2
Height: 66 in
S' Lateral: 2.6 cm
Weight: 2800 oz

## 2022-09-30 LAB — CBC WITH DIFFERENTIAL/PLATELET
Abs Immature Granulocytes: 0.06 10*3/uL (ref 0.00–0.07)
Basophils Absolute: 0 10*3/uL (ref 0.0–0.1)
Basophils Relative: 0 %
Eosinophils Absolute: 0 10*3/uL (ref 0.0–0.5)
Eosinophils Relative: 0 %
HCT: 34.8 % — ABNORMAL LOW (ref 36.0–46.0)
Hemoglobin: 11.7 g/dL — ABNORMAL LOW (ref 12.0–15.0)
Immature Granulocytes: 1 %
Lymphocytes Relative: 7 %
Lymphs Abs: 0.9 10*3/uL (ref 0.7–4.0)
MCH: 31.5 pg (ref 26.0–34.0)
MCHC: 33.6 g/dL (ref 30.0–36.0)
MCV: 93.8 fL (ref 80.0–100.0)
Monocytes Absolute: 0.7 10*3/uL (ref 0.1–1.0)
Monocytes Relative: 6 %
Neutro Abs: 11.1 10*3/uL — ABNORMAL HIGH (ref 1.7–7.7)
Neutrophils Relative %: 86 %
Platelets: 180 10*3/uL (ref 150–400)
RBC: 3.71 MIL/uL — ABNORMAL LOW (ref 3.87–5.11)
RDW: 13.1 % (ref 11.5–15.5)
WBC: 12.8 10*3/uL — ABNORMAL HIGH (ref 4.0–10.5)
nRBC: 0 % (ref 0.0–0.2)

## 2022-09-30 LAB — BASIC METABOLIC PANEL
Anion gap: 6 (ref 5–15)
BUN: 5 mg/dL — ABNORMAL LOW (ref 6–20)
CO2: 25 mmol/L (ref 22–32)
Calcium: 8.2 mg/dL — ABNORMAL LOW (ref 8.9–10.3)
Chloride: 100 mmol/L (ref 98–111)
Creatinine, Ser: 0.54 mg/dL (ref 0.44–1.00)
GFR, Estimated: >60 mL/min (ref >60)
Glucose, Bld: 126 mg/dL — ABNORMAL HIGH (ref 70–99)
Potassium: 4 mmol/L (ref 3.5–5.1)
Sodium: 131 mmol/L — ABNORMAL LOW (ref 135–145)

## 2022-09-30 LAB — URINALYSIS, W/ REFLEX TO CULTURE (INFECTION SUSPECTED)
Bilirubin Urine: NEGATIVE
Glucose, UA: NEGATIVE mg/dL
Ketones, ur: NEGATIVE mg/dL
Leukocytes,Ua: NEGATIVE
Nitrite: NEGATIVE
Protein, ur: NEGATIVE mg/dL
Specific Gravity, Urine: 1.006 (ref 1.005–1.030)
pH: 6 (ref 5.0–8.0)

## 2022-09-30 LAB — COMPREHENSIVE METABOLIC PANEL
ALT: 14 U/L (ref 0–44)
AST: 18 U/L (ref 15–41)
Albumin: 3.1 g/dL — ABNORMAL LOW (ref 3.5–5.0)
Alkaline Phosphatase: 41 U/L (ref 38–126)
Anion gap: 10 (ref 5–15)
BUN: <5 mg/dL — ABNORMAL LOW (ref 6–20)
CO2: 27 mmol/L (ref 22–32)
Calcium: 9 mg/dL (ref 8.9–10.3)
Chloride: 97 mmol/L — ABNORMAL LOW (ref 98–111)
Creatinine, Ser: 0.66 mg/dL (ref 0.44–1.00)
GFR, Estimated: >60 mL/min (ref >60)
Glucose, Bld: 137 mg/dL — ABNORMAL HIGH (ref 70–99)
Potassium: 2.6 mmol/L — CL (ref 3.5–5.1)
Sodium: 134 mmol/L — ABNORMAL LOW (ref 135–145)
Total Bilirubin: 0.5 mg/dL (ref 0.3–1.2)
Total Protein: 6.7 g/dL (ref 6.5–8.1)

## 2022-09-30 LAB — TROPONIN I (HIGH SENSITIVITY)
Troponin I (High Sensitivity): 4 ng/L (ref <18)
Troponin I (High Sensitivity): 9 ng/L (ref <18)

## 2022-09-30 LAB — CK: Total CK: 154 U/L (ref 38–234)

## 2022-09-30 LAB — CULTURE, BLOOD (ROUTINE X 2): Special Requests: ADEQUATE

## 2022-09-30 LAB — TSH: TSH: 0.585 u[IU]/mL (ref 0.350–4.500)

## 2022-09-30 LAB — HCG, SERUM, QUALITATIVE: Preg, Serum: NEGATIVE

## 2022-09-30 LAB — MAGNESIUM
Magnesium: 1.5 mg/dL — ABNORMAL LOW (ref 1.7–2.4)
Magnesium: 1.5 mg/dL — ABNORMAL LOW (ref 1.7–2.4)

## 2022-09-30 LAB — HIV ANTIBODY (ROUTINE TESTING W REFLEX): HIV Screen 4th Generation wRfx: NONREACTIVE

## 2022-09-30 LAB — LACTIC ACID, PLASMA: Lactic Acid, Venous: 1.7 mmol/L (ref 0.5–1.9)

## 2022-09-30 MED ORDER — ALBUTEROL SULFATE (2.5 MG/3ML) 0.083% IN NEBU: 2.5000 mg | INHALATION_SOLUTION | Freq: Four times a day (QID) | RESPIRATORY_TRACT | Status: DC | PRN

## 2022-09-30 MED ORDER — METHADONE HCL 10 MG PO TABS
120.0000 mg | ORAL_TABLET | Freq: Once | ORAL | Status: AC
  Administered 2022-09-30: 120 mg via ORAL
  Filled 2022-09-30: qty 12

## 2022-09-30 MED ORDER — VANCOMYCIN HCL 1500 MG/300ML IV SOLN
1500.0000 mg | Freq: Once | INTRAVENOUS | Status: AC
  Administered 2022-09-30: 1500 mg via INTRAVENOUS
  Filled 2022-09-30: qty 300

## 2022-09-30 MED ORDER — ENOXAPARIN SODIUM 40 MG/0.4ML IJ SOSY
40.0000 mg | PREFILLED_SYRINGE | INTRAMUSCULAR | Status: DC
  Filled 2022-09-30 (×2): qty 0.4

## 2022-09-30 MED ORDER — SODIUM CHLORIDE 0.9 % IV SOLN
2.0000 g | Freq: Once | INTRAVENOUS | Status: AC
  Administered 2022-09-30: 2 g via INTRAVENOUS
  Filled 2022-09-30: qty 12.5

## 2022-09-30 MED ORDER — POTASSIUM CHLORIDE 10 MEQ/100ML IV SOLN
10.0000 meq | INTRAVENOUS | Status: AC
  Administered 2022-09-30: 10 meq via INTRAVENOUS
  Filled 2022-09-30: qty 100

## 2022-09-30 MED ORDER — ACETAMINOPHEN 650 MG RE SUPP: 650.0000 mg | Freq: Four times a day (QID) | RECTAL | Status: DC | PRN

## 2022-09-30 MED ORDER — LIDOCAINE 5 % EX PTCH
1.0000 | MEDICATED_PATCH | CUTANEOUS | Status: DC
  Administered 2022-09-30 – 2022-10-01 (×2): 1 via TRANSDERMAL
  Filled 2022-09-30 (×2): qty 1

## 2022-09-30 MED ORDER — DIVALPROEX SODIUM ER 500 MG PO TB24
500.0000 mg | ORAL_TABLET | Freq: Every day | ORAL | Status: DC
  Administered 2022-09-30 – 2022-10-02 (×3): 500 mg via ORAL
  Filled 2022-09-30 (×3): qty 1

## 2022-09-30 MED ORDER — ACETAMINOPHEN 325 MG PO TABS
650.0000 mg | ORAL_TABLET | Freq: Four times a day (QID) | ORAL | Status: DC | PRN
  Administered 2022-09-30: 650 mg via ORAL
  Filled 2022-09-30: qty 2

## 2022-09-30 MED ORDER — METHADONE HCL 10 MG PO TABS
120.0000 mg | ORAL_TABLET | Freq: Every day | ORAL | Status: DC
  Administered 2022-10-01 – 2022-10-02 (×2): 120 mg via ORAL
  Filled 2022-09-30 (×2): qty 12

## 2022-09-30 MED ORDER — METHOCARBAMOL 500 MG PO TABS
500.0000 mg | ORAL_TABLET | Freq: Three times a day (TID) | ORAL | Status: DC | PRN
  Administered 2022-10-01 (×2): 500 mg via ORAL
  Filled 2022-09-30 (×3): qty 1

## 2022-09-30 MED ORDER — MAGNESIUM SULFATE 4 GM/100ML IV SOLN
4.0000 g | Freq: Once | INTRAVENOUS | Status: AC
  Administered 2022-09-30: 4 g via INTRAVENOUS
  Filled 2022-09-30: qty 100

## 2022-09-30 MED ORDER — SENNOSIDES-DOCUSATE SODIUM 8.6-50 MG PO TABS
1.0000 | ORAL_TABLET | Freq: Every evening | ORAL | Status: DC | PRN
  Administered 2022-10-01: 1 via ORAL
  Filled 2022-09-30: qty 1

## 2022-09-30 MED ORDER — POTASSIUM CHLORIDE 10 MEQ/100ML IV SOLN
10.0000 meq | Freq: Once | INTRAVENOUS | Status: AC
  Administered 2022-09-30: 10 meq via INTRAVENOUS

## 2022-09-30 MED ORDER — IOHEXOL 350 MG/ML SOLN
100.0000 mL | Freq: Once | INTRAVENOUS | Status: AC | PRN
  Administered 2022-09-30: 100 mL via INTRAVENOUS

## 2022-09-30 MED ORDER — DOXYCYCLINE HYCLATE 100 MG PO TABS: 100.0000 mg | ORAL_TABLET | Freq: Two times a day (BID) | ORAL | Status: DC

## 2022-09-30 MED ORDER — SODIUM CHLORIDE 0.9 % IV SOLN: 100.0000 mg | Freq: Two times a day (BID) | INTRAVENOUS | Status: DC

## 2022-09-30 MED ORDER — MAGNESIUM SULFATE 2 GM/50ML IV SOLN: 2.0000 g | Freq: Once | INTRAVENOUS | Status: DC

## 2022-09-30 MED ORDER — ACETAMINOPHEN 500 MG PO TABS
1000.0000 mg | ORAL_TABLET | Freq: Once | ORAL | Status: AC
  Administered 2022-09-30: 1000 mg via ORAL
  Filled 2022-09-30: qty 2

## 2022-09-30 MED ORDER — POTASSIUM CHLORIDE 10 MEQ/100ML IV SOLN
10.0000 meq | INTRAVENOUS | Status: AC
  Administered 2022-09-30 (×3): 10 meq via INTRAVENOUS
  Filled 2022-09-30 (×4): qty 100

## 2022-09-30 MED ORDER — SODIUM CHLORIDE 0.9 % IV SOLN
100.0000 mg | Freq: Two times a day (BID) | INTRAVENOUS | Status: DC
  Administered 2022-10-01: 100 mg via INTRAVENOUS
  Filled 2022-09-30: qty 100

## 2022-09-30 MED ORDER — KETOROLAC TROMETHAMINE 15 MG/ML IJ SOLN
15.0000 mg | Freq: Once | INTRAMUSCULAR | Status: AC
  Administered 2022-09-30: 15 mg via INTRAVENOUS
  Filled 2022-09-30: qty 1

## 2022-09-30 MED ORDER — METHADONE HCL 10 MG/ML PO CONC: 120.0000 mg | Freq: Every day | ORAL | Status: DC

## 2022-09-30 MED ORDER — SODIUM CHLORIDE 0.9 % IV BOLUS
1000.0000 mL | Freq: Once | INTRAVENOUS | Status: AC
  Administered 2022-09-30: 1000 mL via INTRAVENOUS

## 2022-09-30 MED ORDER — POTASSIUM CHLORIDE CRYS ER 20 MEQ PO TBCR
40.0000 meq | EXTENDED_RELEASE_TABLET | Freq: Once | ORAL | Status: AC
  Administered 2022-09-30: 40 meq via ORAL
  Filled 2022-09-30: qty 2

## 2022-09-30 MED ORDER — SODIUM CHLORIDE 0.9 % IV SOLN: 2.0000 g | INTRAVENOUS | Status: DC

## 2022-09-30 MED ORDER — KETOROLAC TROMETHAMINE 15 MG/ML IJ SOLN
15.0000 mg | Freq: Four times a day (QID) | INTRAMUSCULAR | Status: DC | PRN
  Administered 2022-10-01 – 2022-10-02 (×4): 15 mg via INTRAVENOUS
  Filled 2022-09-30 (×4): qty 1

## 2022-09-30 NOTE — H&P (Signed)
Date: 09/30/2022               Patient Name:  Amber [name] MRN: [id number]  DOB: 1990/06/03 Age / Sex: 32 y.o., female   PCP: System, Provider Not In         Medical Service: Internal Medicine Teaching Service         Attending Physician: Dr. Reymundo Poll, MD      First Contact: Faith Rogue, DO        Pager: [place] [phone]        Second Contact: Morene Crocker, MD    Pager: [facility] [phone]    Chief Complaint: Shortness of breath  History of Present Illness:  32 year old female with PMHx of seizure disorder and polysubstance use presenting to the ED for shortness of breath and CP that started on Wednesday. Describes it as sharp chest pain radiating to the back. It worsens with movements. Fever overnight per pt of 101. States did nothing out of the ordinary.  Pt gave differing hx to EDP and me where she endorsed doing crack on the day of symptom onset but reported doing only heroin to me and stated it was earlier in the week.   States apart from chest pain and shortness of breath, she has myalgias and some fevers.   Review of Systems negative unless stated in the HPI.  In the ED, she was started on Cefepime and Vancomycin. EKG and troponin ruled out ACS. Pt noted to be hypokalemic and started on supplementation.   Past Medical History: Past Medical History:  Diagnosis Date   Abnormal Pap smear    GBS (group B streptococcus) UTI complicating pregnancy 04/17/2012   IC (interstitial cystitis) 04/17/2012   Interstitial cystitis    Ovarian cyst    Protein in urine    Renal abscess    Seizures (HCC)     Meds: Current Outpatient Medications  Medication Instructions   acetaminophen (TYLENOL) 1,000 mg, Oral, 2 times daily PRN   ibuprofen (ADVIL) 800 mg, Oral, 2 times daily PRN   methadone (DOLOPHINE) 120 mg, Oral, Daily   POTASSIUM CHLORIDE PO 1 tablet, Oral, Daily, OTC strength  Last took meds one week ago.   Allergies: Allergies as of 09/30/2022 -  Review Complete 09/30/2022  Allergen Reaction Noted   Biaxin [clarithromycin] Anaphylaxis     Past Surgical History: Past Surgical History:  Procedure Laterality Date   DILATION AND CURETTAGE OF UTERUS     IR FLUORO GUIDED NEEDLE PLC ASPIRATION/INJECTION LOC  01/03/2020    Family History:  Family History  Problem Relation Age of Onset   Cancer Maternal Uncle    Diabetes Maternal Grandmother    Cancer Maternal Grandmother        colon   Diabetes Paternal Grandmother    Cancer Paternal Grandmother        bone   Other Neg Hx   Renal Cancer in paternal grandmother  Social History:  Lives with grandfather. Has a daughter that lives. Currently works for an agoraphobic lady as Geophysicist/field seismologist Tobacco- 1/2 pack for 10 years.  EtOH- no alcohol use. No marijuana use.  IADLs/ADLs- can person independently at baseline   Physical Exam: Blood pressure 120/85, pulse 93, temperature 99.7 F (37.6 C), temperature source Oral, resp. rate 20, height 5\' 6"  (1.676 m), weight 79.4 kg, last menstrual period 09/05/2022, SpO2 95%. General: NAD HENT: NCAT, PERRL Lungs: rhonchi present Cardiovascular: sinus tachycardia Abdomen: No TTP of abdomen, normal bowel  sounds MSK: No asymmetry Skin: track marks bilateral arm multiple in number, notable skin blister on left hand.  Neuro: alert and oriented x4 Psych: flat affect  Diagnostics:     Latest Ref Rng & Units 09/30/2022    8:45 AM 01/07/2020    1:54 AM 01/06/2020   12:17 PM  CBC  WBC 4.0 - 10.5 K/uL 12.8  5.4  6.7   Hemoglobin 12.0 - 15.0 g/dL 16.1  09.6  04.5   Hematocrit 36.0 - 46.0 % 34.8  40.3  39.5   Platelets 150 - 400 K/uL 180  277  289        Latest Ref Rng & Units 09/30/2022    8:45 AM 01/07/2020    1:54 AM 01/06/2020   12:17 PM  CMP  Glucose 70 - 99 mg/dL 409  92  811   BUN 6 - 20 mg/dL 5  13  10    Creatinine 0.44 - 1.00 mg/dL 9.14  7.82  9.56   Sodium 135 - 145 mmol/L 134  137  136   Potassium 3.5 - 5.1 mmol/L 2.6  3.4  3.2    Chloride 98 - 111 mmol/L 97  105  105   CO2 22 - 32 mmol/L 27  20  22    Calcium 8.9 - 10.3 mg/dL 9.0  9.8  9.7   Total Protein 6.5 - 8.1 g/dL 6.7     Total Bilirubin 0.3 - 1.2 mg/dL 0.5     Alkaline Phos 38 - 126 U/L 41     AST 15 - 41 U/L 18     ALT 0 - 44 U/L 14       DG Chest 2 View  Result Date: 09/30/2022 CLINICAL DATA:  32 year old female with shortness of breath. EXAM: CHEST - 2 VIEW COMPARISON:  Portable chest 01/01/2020 and earlier. FINDINGS: Upright PA and lateral views at 0636 hours. Lower lung volumes on both views. Normal cardiac size and mediastinal contours. Visualized tracheal air column is within normal limits. Symmetric increased bilateral pulmonary interstitial opacity, patchy bilateral lung base opacity. No pneumothorax. No convincing pleural effusion. No consolidation. Thoracic scoliosis is chronic. No acute osseous abnormality identified. Negative visible bowel gas. IMPRESSION: Low lung volumes with atelectasis. Superimposed increased interstitial opacity is nonspecific with differential considerations of viral/atypical respiratory infection, pulmonary interstitial edema. No pleural effusion. Electronically Signed   By: Odessa Fleming M.D.   On: 09/30/2022 06:50     EKG: personally reviewed my interpretation is sinus tachycardia with artifact.  CXR: personally reviewed my interpretation is showing decreases lung inflation and bilateral opacities.   Assessment & Plan by Problem:  Present on Admission:  Acute hypoxic respiratory failure (HCC)   Acute Hypoxic Respiratory Failure Pt with acute hypoxic respiratory that is improving. She was initially on 3 L of oxygen but has weaned down to room air. Suspect she has pneumonia and given her hx drug use and recent drug use suspect aspiration pneumonia. CXR showing opacities and CBC with leukocytosis with neutrophil predominance. She received Cefepime and Vancomycin in the ED. Her respiratory symptoms have improved. We can switch her  to CAP coverage with doxycycline starting tomorrow as she has received IV antibiotics today. The doxycycline can be switched to oral based on clinical appearance. UDS pending. Will get echo given concern for pulmonary edema and hx of IVDU. ACS ruled out with negative troponin. PE was considered but Well score of 1.5 places her at low risk. Currently pt is neither tachycardic nor  hypoxic. She does get short of breath with movement.   Chest Pain  Hypokalemia Suspect MSK vs muscle spasms from severe hypokalemia. Troponin negative. No ischemic changes. Slight J point elevation. Describes the pain as rib pain which supports MSK. Will continue to monitor for now. K pad ordered. Will replete hypokalemia. No hypokalemia induced EKG changes observed. Will repeat K this pm. Checking magnesium.   Opioid Use Disorder On [facility]. Will continue here.   Hypoalbuminemia Suspect secondary to acute infection.   Seizure Disorder States regularly takes her medication and has not had a recent seizure. Will continue home med.   DVT prophx: Lovenox Diet: Regular Bowel: PRN Code: Full  Prior to Admission Living Arrangement: Home Anticipated Discharge Location: Home Barriers to Discharge: Medical Workup  Dispo: Admit patient to Inpatient with expected length of stay greater than 2 midnights.  Gwenevere Abbot, MD Eligha Bridegroom. [facility] Internal Medicine Residency, PGY-3 Pager: [phone] After 5 pm or weekends:  1st Contact: Pager: [phone]  2nd Contact: Pager: [phone]

## 2022-09-30 NOTE — ED Triage Notes (Signed)
Says she's been having increased shob x 2 days and unable to take a full breath.   Shallow breathing in triage.   C/o back and bilateral rib pain rated 15/10.

## 2022-09-30 NOTE — Progress Notes (Signed)
ED Pharmacy Antibiotic Sign Off An antibiotic consult was received from an ED provider for sepsis per pharmacy dosing for vancomycin. A chart review was completed to assess appropriateness.   The following one time order(s) were placed:  Cefepime 2g IV x 1 Vancomycin 1500 mg IV x 1  Further antibiotic and/or antibiotic pharmacy consults should be ordered by the admitting provider if indicated.   Thank you for allowing pharmacy to be a part of this patient's care.   Daylene Posey, [facility]  Clinical Pharmacist 09/30/22 8:31 AM
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
## 2022-09-30 NOTE — Progress Notes (Signed)
PHARMACY - PHYSICIAN COMMUNICATION CRITICAL VALUE ALERT - BLOOD CULTURE IDENTIFICATION (BCID)  Amber [name] is an 32 y.o. female who presented to [facility] on 09/30/2022 with a chief complaint of shortness of breath  7/26 Blood: 1/2 Gram + rods  Name of physician (or Provider) Contacted: Dr. August Saucer  Current antibiotics: Doxycycline   Changes to prescribed antibiotics recommended:  Likely contaminant  No changes  Results for orders placed or performed during the hospital encounter of 12/31/19  Blood Culture ID Panel (Reflexed) (Collected: 12/31/2019  4:15 PM)  Result Value Ref Range   Enterococcus faecalis NOT DETECTED NOT DETECTED   Enterococcus Faecium NOT DETECTED NOT DETECTED   Listeria monocytogenes NOT DETECTED NOT DETECTED   Staphylococcus species NOT DETECTED NOT DETECTED   Staphylococcus aureus (BCID) NOT DETECTED NOT DETECTED   Staphylococcus epidermidis NOT DETECTED NOT DETECTED   Staphylococcus lugdunensis NOT DETECTED NOT DETECTED   Streptococcus species NOT DETECTED NOT DETECTED   Streptococcus agalactiae NOT DETECTED NOT DETECTED   Streptococcus pneumoniae NOT DETECTED NOT DETECTED   Streptococcus pyogenes NOT DETECTED NOT DETECTED   A.calcoaceticus-baumannii NOT DETECTED NOT DETECTED   Bacteroides fragilis NOT DETECTED NOT DETECTED   Enterobacterales NOT DETECTED NOT DETECTED   Enterobacter cloacae complex NOT DETECTED NOT DETECTED   Escherichia coli NOT DETECTED NOT DETECTED   Klebsiella aerogenes NOT DETECTED NOT DETECTED   Klebsiella oxytoca NOT DETECTED NOT DETECTED   Klebsiella pneumoniae NOT DETECTED NOT DETECTED   Proteus species NOT DETECTED NOT DETECTED   Salmonella species NOT DETECTED NOT DETECTED   Serratia marcescens NOT DETECTED NOT DETECTED   Haemophilus influenzae NOT DETECTED NOT DETECTED   Neisseria meningitidis NOT DETECTED NOT DETECTED   Pseudomonas aeruginosa NOT DETECTED NOT DETECTED   Stenotrophomonas maltophilia NOT DETECTED NOT  DETECTED   Candida albicans NOT DETECTED NOT DETECTED   Candida auris NOT DETECTED NOT DETECTED   Candida glabrata NOT DETECTED NOT DETECTED   Candida krusei NOT DETECTED NOT DETECTED   Candida parapsilosis NOT DETECTED NOT DETECTED   Candida tropicalis NOT DETECTED NOT DETECTED   Cryptococcus neoformans/gattii NOT DETECTED NOT DETECTED    Abran Duke, PharmD, BCPS Clinical Pharmacist Phone: [phone]

## 2022-09-30 NOTE — ED Notes (Signed)
Delay in labs and IV. Pt up to b/r with assist.

## 2022-09-30 NOTE — ED Notes (Signed)
PBT at [facility] again for labs, unsuccessful x2.

## 2022-09-30 NOTE — ED Provider Notes (Addendum)
[place] EMERGENCY DEPARTMENT AT [facility] Provider Note   CSN: [id number] Arrival date & time: 09/30/22  4098     History  Chief Complaint  Patient presents with   Shortness of Breath   HPI Amber [name] is a 32 y.o. female with h/o seizures, renal abscess presenting for shortness of breath.  Started 2 days ago and progressively worsened.  Is worse with exertion.  Endorses chest pain as well. Chest pain located in the left and right side of the chest.  Feels like "someone sitting on my chest".  Radiates to the back.  Also endorses fever and intermittent nonproductive cough at home.  Also reports that she did use "crystal meth" and heroin on Wednesday.   Shortness of Breath      Home Medications Prior to Admission medications   Medication Sig Start Date End Date Taking? Authorizing Provider  methadone (DOLOPHINE) 10 MG/5ML solution Take 120 mg by mouth daily.   Yes [provider]  divalproex (DEPAKOTE) 500 MG DR tablet Take 1 tablet in AM, 2 tablets in PM 05/07/21   Van Clines, MD  etonogestrel (NEXPLANON) 68 MG IMPL implant 1 each by Subdermal route once.    [provider]  topiramate (TOPAMAX) 100 MG tablet Take 1 tablet (100 mg total) by mouth 2 (two) times daily. 05/07/21   Van Clines, MD      Allergies    Clarithromycin    Review of Systems   Review of Systems  Respiratory:  Positive for shortness of breath.     Physical Exam   Vitals:   09/30/22 1015 09/30/22 1030  BP: 129/86   Pulse: (!) 107 (!) 106  Resp: (!) 23 (!) 29  Temp:    SpO2: 93% 91%    CONSTITUTIONAL:  ill-appearing, NAD NEURO:  Alert and oriented x 3, CN 3-12 grossly intact EYES:  eyes equal and reactive ENT/NECK:  Supple, no stridor CARDIO:  Tachycardic, regular rhythm, appears well-perfused  PULM: Tachypneic, CTAB, no rales or wheezing GI/GU:  non-distended, soft MSK/SPINE:  No gross deformities, no edema, moves all extremities  SKIN: pale, moist,  blisters noted about the left hand, ecchymosis noted in left upper arm surrounding punctate lesion. No observed Janeway lesions or Osler nodes.  *Additional and/or pertinent findings included in MDM below  ED Results / Procedures / Treatments   Labs (all labs ordered are listed, but only abnormal results are displayed) Labs Reviewed  CBC WITH DIFFERENTIAL/PLATELET - Abnormal; Notable for the following components:      Result Value   WBC 12.8 (*)    RBC 3.71 (*)    Hemoglobin 11.7 (*)    HCT 34.8 (*)    Neutro Abs 11.1 (*)    All other components within normal limits  COMPREHENSIVE METABOLIC PANEL - Abnormal; Notable for the following components:   Sodium 134 (*)    Potassium 2.6 (*)    Chloride 97 (*)    Glucose, Bld 137 (*)    BUN <5 (*)    Albumin 3.1 (*)    All other components within normal limits  URINALYSIS, W/ REFLEX TO CULTURE (INFECTION SUSPECTED) - Abnormal; Notable for the following components:   Hgb urine dipstick SMALL (*)    Bacteria, UA RARE (*)    All other components within normal limits  CULTURE, BLOOD (ROUTINE X 2)  CULTURE, BLOOD (ROUTINE X 2)  HCG, SERUM, QUALITATIVE  CK  LACTIC ACID, PLASMA  LACTIC ACID, PLASMA  I-STAT CG4 LACTIC ACID, ED  TROPONIN I (HIGH SENSITIVITY)  TROPONIN I (HIGH SENSITIVITY)    EKG EKG Interpretation Date/Time:  Friday September 30 2022 07:53:56 EDT Ventricular Rate:  107 PR Interval:  161 QRS Duration:  104 QT Interval:  341 QTC Calculation: 455 R Axis:   95  Text Interpretation: Sinus tachycardia Probable left atrial enlargement Borderline right axis deviation Borderline T abnormalities, anterior leads Baseline wander in lead(s) V2 V4 Confirmed by Alona Bene [phone]) on 09/30/2022 8:06:11 AM  Radiology DG Chest 2 View  Result Date: 09/30/2022 CLINICAL DATA:  32 year old female with shortness of breath. EXAM: CHEST - 2 VIEW COMPARISON:  Portable chest 01/01/2020 and earlier. FINDINGS: Upright PA and lateral views at 0636  hours. Lower lung volumes on both views. Normal cardiac size and mediastinal contours. Visualized tracheal air column is within normal limits. Symmetric increased bilateral pulmonary interstitial opacity, patchy bilateral lung base opacity. No pneumothorax. No convincing pleural effusion. No consolidation. Thoracic scoliosis is chronic. No acute osseous abnormality identified. Negative visible bowel gas. IMPRESSION: Low lung volumes with atelectasis. Superimposed increased interstitial opacity is nonspecific with differential considerations of viral/atypical respiratory infection, pulmonary interstitial edema. No pleural effusion. Electronically Signed   By: Odessa Fleming M.D.   On: 09/30/2022 06:50    Procedures .Critical Care  Performed by: Gareth Eagle, PA-C Authorized by: Gareth Eagle, PA-C   Critical care provider statement:    Critical care time (minutes):  30   Critical care was necessary to treat or prevent imminent or life-threatening deterioration of the following conditions:  Sepsis and metabolic crisis (sepsis with hypokalemia)   Critical care was time spent personally by me on the following activities:  Development of treatment plan with patient or surrogate, discussions with consultants, evaluation of patient's response to treatment, examination of patient, ordering and review of laboratory studies, ordering and review of radiographic studies, ordering and performing treatments and interventions, pulse oximetry, re-evaluation of patient's condition and review of old charts     Medications Ordered in ED Medications  vancomycin (VANCOREADY) IVPB 1500 mg/300 mL (1,500 mg Intravenous New Bag/Given 09/30/22 1022)  potassium chloride 10 mEq in 100 mL IVPB (has no administration in time range)  acetaminophen (TYLENOL) tablet 1,000 mg (1,000 mg Oral Given 09/30/22 0815)  sodium chloride 0.9 % bolus 1,000 mL (0 mLs Intravenous Stopped 09/30/22 1005)  ceFEPIme (MAXIPIME) 2 g in sodium chloride  0.9 % 100 mL IVPB (0 g Intravenous Stopped 09/30/22 1004)  methadone (DOLOPHINE) tablet 120 mg (120 mg Oral Given 09/30/22 1031)  potassium chloride SA (KLOR-CON M) CR tablet 40 mEq (40 mEq Oral Given 09/30/22 1031)    ED Course/ Medical Decision Making/ A&P                             Medical Decision Making Amount and/or Complexity of Data Reviewed Labs: ordered. Radiology: ordered.  Risk OTC drugs. Prescription drug management. Decision regarding hospitalization.   Initial Impression and Ddx 32 year old ill-appearing female presenting for shortness of breath and intermittent chest pain.  Exam notable for tachypnea and tachycardia.  DDx includes pneumonia, sepsis, ACS, PE, endocarditis Patient PMH that increases complexity of ED encounter:  IVDU  Interpretation of Diagnostics I independent reviewed and interpreted the labs as followed: Hypokalemia, leukocytosis  - I independently visualized the following imaging with scope of interpretation limited to determining acute life threatening conditions related to emergency care: Chest x-ray, which revealed findings  concerning for respiratory infection  -I personally reviewed and interpreted EKG which revealed sinus tachycardia.  Patient Reassessment and Ultimate Disposition/Management Workup suggest pneumonia and sepsis cannot fully be ruled out.  Started on IV antibiotics.  Admitted to hospital service with Dr. Welton Flakes. Labs also indicate hypokalemia.  Treated with oral and IV potassium.  Also considered ACS and PE.  Thus far workup reassuring.  Patient management required discussion with the following services or consulting groups:  Hospitalist Service  Complexity of Problems Addressed Acute complicated illness or Injury  Additional Data Reviewed and Analyzed Further history obtained from: Further history from spouse/family member, Past medical history and medications listed in the EMR, and Prior ED visit notes  Patient Encounter  Risk Assessment Consideration of hospitalization    Final Clinical Impression(s) / ED Diagnoses Final diagnoses:  Pneumonia due to infectious organism, unspecified laterality, unspecified part of lung  Hypokalemia    Rx / DC Orders ED Discharge Orders     None          Gareth Eagle, PA-C 09/30/22 1234    Long, Arlyss Repress, MD 10/07/22 0140

## 2022-09-30 NOTE — ED Notes (Signed)
ED TO INPATIENT HANDOFF REPORT  ED Nurse Name and Phone #: [phone]  S Name/Age/Gender Amber [name] 32 y.o. female Room/Bed: 046C/046C  Code Status   Code Status: Full Code  Home/SNF/Other Home Patient oriented to: self, place, time, and situation Is this baseline? Yes   Triage Complete: Triage complete  Chief Complaint Acute hypoxic respiratory failure (HCC) [J96.01]  Triage Note Says she's been having increased shob x 2 days and unable to take a full breath.   Shallow breathing in triage.   C/o back and bilateral rib pain rated 15/10.    Allergies Allergies  Allergen Reactions   Biaxin [Clarithromycin] Anaphylaxis    Level of Care/Admitting Diagnosis ED Disposition     ED Disposition  Admit   Condition  --   Comment  Hospital Area: [facility] [[id number]]  Level of Care: Telemetry Medical [104]  May admit patient to [facility] or [facility] if equivalent level of care is available:: No  Covid Evaluation: Symptomatic Person Under Investigation (PUI) or recent exposure (last 10 days) *Testing Required*  Diagnosis: Acute hypoxic respiratory failure [facility]) [[id number]]  Admitting Physician: Reymundo Poll [[id number]]  Attending Physician: Reymundo Poll [[id number]]  Certification:: I certify this patient will need inpatient services for at least 2 midnights  Estimated Length of Stay: 3          B Medical/Surgery History Past Medical History:  Diagnosis Date   Abnormal Pap smear    GBS (group B streptococcus) UTI complicating pregnancy 04/17/2012   IC (interstitial cystitis) 04/17/2012   Interstitial cystitis    Ovarian cyst    Protein in urine    Renal abscess    Seizures (HCC)    Past Surgical History:  Procedure Laterality Date   DILATION AND CURETTAGE OF UTERUS     IR FLUORO GUIDED NEEDLE PLC ASPIRATION/INJECTION LOC  01/03/2020     A IV Location/Drains/Wounds Patient Lines/Drains/Airways Status     Active  Line/Drains/Airways     Name Placement date Placement time Site Days   Peripheral IV 09/30/22 20 G 1.88" Anterior;Left;Proximal Forearm 09/30/22  0845  Forearm  less than 1   Incision (Closed) 01/03/20 Back Lower 01/03/20  1602  -- 1001   Wound / Incision (Open or Dehisced) Lower --  --  --  --            Intake/Output Last 24 hours  Intake/Output Summary (Last 24 hours) at 09/30/2022 1609 Last data filed at 09/30/2022 1005 Gross per 24 hour  Intake 1100 ml  Output 900 ml  Net 200 ml    Labs/Imaging Results for orders placed or performed during the hospital encounter of 09/30/22 (from the past 48 hour(s))  Urinalysis, w/ Reflex to Culture (Infection Suspected) -Urine, Clean Catch     Status: Abnormal   Collection Time: 09/30/22  8:20 AM  Result Value Ref Range   Specimen Source URINE, CLEAN CATCH    Color, Urine YELLOW YELLOW   APPearance CLEAR CLEAR   Specific Gravity, Urine 1.006 1.005 - 1.030   pH 6.0 5.0 - 8.0   Glucose, UA NEGATIVE NEGATIVE mg/dL   Hgb urine dipstick SMALL (A) NEGATIVE   Bilirubin Urine NEGATIVE NEGATIVE   Ketones, ur NEGATIVE NEGATIVE mg/dL   Protein, ur NEGATIVE NEGATIVE mg/dL   Nitrite NEGATIVE NEGATIVE   Leukocytes,Ua NEGATIVE NEGATIVE   RBC / HPF 0-5 0 - 5 RBC/hpf   WBC, UA 0-5 0 - 5 WBC/hpf    Comment:  Reflex urine culture not performed if WBC <=10, OR if Squamous epithelial cells >5. If Squamous epithelial cells >5 suggest recollection.    Bacteria, UA RARE (A) NONE SEEN   Squamous Epithelial / HPF 0-5 0 - 5 /HPF   Mucus PRESENT     Comment: Performed at [facility], [address] [place], [place] [address]  CBC with Differential     Status: Abnormal   Collection Time: 09/30/22  8:45 AM  Result Value Ref Range   WBC 12.8 (H) 4.0 - 10.5 K/uL   RBC 3.71 (L) 3.87 - 5.11 MIL/uL   Hemoglobin 11.7 (L) 12.0 - 15.0 g/dL   HCT 65.7 (L) 84.6 - 96.2 %   MCV 93.8 80.0 - 100.0 fL   MCH 31.5 26.0 - 34.0 pg   MCHC 33.6 30.0 -  36.0 g/dL   RDW 95.2 84.1 - 32.4 %   Platelets 180 150 - 400 K/uL   nRBC 0.0 0.0 - 0.2 %   Neutrophils Relative % 86 %   Neutro Abs 11.1 (H) 1.7 - 7.7 K/uL   Lymphocytes Relative 7 %   Lymphs Abs 0.9 0.7 - 4.0 K/uL   Monocytes Relative 6 %   Monocytes Absolute 0.7 0.1 - 1.0 K/uL   Eosinophils Relative 0 %   Eosinophils Absolute 0.0 0.0 - 0.5 K/uL   Basophils Relative 0 %   Basophils Absolute 0.0 0.0 - 0.1 K/uL   Immature Granulocytes 1 %   Abs Immature Granulocytes 0.06 0.00 - 0.07 K/uL    Comment: Performed at [facility], [address], [place], [place] [address]  Comprehensive metabolic panel     Status: Abnormal   Collection Time: 09/30/22  8:45 AM  Result Value Ref Range   Sodium 134 (L) 135 - 145 mmol/L   Potassium 2.6 (LL) 3.5 - 5.1 mmol/L    Comment: CRITICAL RESULT CALLED TO, READ BACK BY AND VERIFIED WITH J. Adriana Simas RN [phone] M.ALAMANO   Chloride 97 (L) 98 - 111 mmol/L   CO2 27 22 - 32 mmol/L   Glucose, Bld 137 (H) 70 - 99 mg/dL    Comment: Glucose reference range applies only to samples taken after fasting for at least 8 hours.   BUN <5 (L) 6 - 20 mg/dL   Creatinine, Ser 4.74 0.44 - 1.00 mg/dL   Calcium 9.0 8.9 - 25.9 mg/dL   Total Protein 6.7 6.5 - 8.1 g/dL   Albumin 3.1 (L) 3.5 - 5.0 g/dL   AST 18 15 - 41 U/L   ALT 14 0 - 44 U/L   Alkaline Phosphatase 41 38 - 126 U/L   Total Bilirubin 0.5 0.3 - 1.2 mg/dL   GFR, Estimated >56 >38 mL/min    Comment: (NOTE) Calculated using the CKD-EPI Creatinine Equation (2021)    Anion gap 10 5 - 15    Comment: Performed at [facility], [address] [place], [place] [address]  hCG, serum, qualitative     Status: None   Collection Time: 09/30/22  8:45 AM  Result Value Ref Range   Preg, Serum NEGATIVE NEGATIVE    Comment:        THE SENSITIVITY OF THIS METHODOLOGY IS >10 mIU/mL. Performed at [facility], [address] [place], [place] [address]   Troponin I (High Sensitivity)     Status: None    Collection Time: 09/30/22  8:45 AM  Result Value Ref Range  Troponin I (High Sensitivity) 4 <18 ng/L    Comment: (NOTE) Elevated high sensitivity troponin I (hsTnI) values and significant  changes across serial measurements may suggest ACS but many other  chronic and acute conditions are known to elevate hsTnI results.  Refer to the "Links" section for chest pain algorithms and additional  guidance. Performed at [facility], [address], [place], [place] [address]   CK     Status: None   Collection Time: 09/30/22  8:45 AM  Result Value Ref Range   Total CK 154 38 - 234 U/L    Comment: Performed at [facility], [address], [place], [place] [address]  Lactic acid, plasma     Status: None   Collection Time: 09/30/22  8:45 AM  Result Value Ref Range   Lactic Acid, Venous 1.7 0.5 - 1.9 mmol/L    Comment: Performed at [facility], [address] [place], [place] [address]  TSH     Status: None   Collection Time: 09/30/22  8:45 AM  Result Value Ref Range   TSH 0.585 0.350 - 4.500 uIU/mL    Comment: Performed by a 3rd Generation assay with a functional sensitivity of <=0.01 uIU/mL. Performed at [facility], [address] [place], [place] [address]   Magnesium     Status: Abnormal   Collection Time: 09/30/22  8:45 AM  Result Value Ref Range   Magnesium 1.5 (L) 1.7 - 2.4 mg/dL    Comment: Performed at [facility], [address], [place], [place] [address]  Magnesium     Status: Abnormal   Collection Time: 09/30/22  2:02 PM  Result Value Ref Range   Magnesium 1.5 (L) 1.7 - 2.4 mg/dL    Comment: Performed at [facility], [address] [place], [place] [address]   DG Chest 2 View  Result Date: 09/30/2022 CLINICAL DATA:  32 year old female with shortness of breath. EXAM: CHEST - 2 VIEW COMPARISON:  Portable chest 01/01/2020 and earlier. FINDINGS: Upright PA and lateral views at 0636 hours. Lower lung volumes on both views. Normal  cardiac size and mediastinal contours. Visualized tracheal air column is within normal limits. Symmetric increased bilateral pulmonary interstitial opacity, patchy bilateral lung base opacity. No pneumothorax. No convincing pleural effusion. No consolidation. Thoracic scoliosis is chronic. No acute osseous abnormality identified. Negative visible bowel gas. IMPRESSION: Low lung volumes with atelectasis. Superimposed increased interstitial opacity is nonspecific with differential considerations of viral/atypical respiratory infection, pulmonary interstitial edema. No pleural effusion. Electronically Signed   By: Odessa Fleming M.D.   On: 09/30/2022 06:50    Pending Labs Unresulted Labs (From admission, onward)     Start     Ordered   10/01/22 0500  CBC  Tomorrow morning,   R        09/30/22 1233   09/30/22 1800  Basic metabolic panel  Once-Timed,   TIMED        09/30/22 1335   09/30/22 1254  Resp panel by RT-PCR (RSV, Flu A&B, Covid) Anterior Nasal Swab  (Tier 2 - SARS Coronavirus 2 by RT PCR (hospital order, performed in [facility] hospital lab) *cepheid single result test*)  Once,   R        09/30/22 1253   09/30/22 1230  HIV Antibody (routine testing w rflx)  (HIV Antibody (Routine testing w reflex) panel)  Once,   R  09/30/22 1233   09/30/22 1143  Rapid urine drug screen (hospital performed)  ONCE - STAT,   STAT        09/30/22 1142   09/30/22 0735  Culture, blood (routine x 2)  BLOOD CULTURE X 2,   R (with STAT occurrences)      09/30/22 0734            Vitals/Pain Today's Vitals   09/30/22 1015 09/30/22 1030 09/30/22 1031 09/30/22 1138  BP: 129/86   120/85  Pulse: (!) 107 (!) 106  93  Resp: (!) 23 (!) 29  20  Temp:    99.7 F (37.6 C)  TempSrc:    Oral  SpO2: 93% 91%  95%  Weight:      Height:      PainSc:   9      Isolation Precautions Airborne and Contact precautions  Medications Medications  potassium chloride 10 mEq in 100 mL IVPB (10 mEq Intravenous New  Bag/Given 09/30/22 1353)  enoxaparin (LOVENOX) injection 40 mg (40 mg Subcutaneous Not Given 09/30/22 1528)  acetaminophen (TYLENOL) tablet 650 mg (has no administration in time range)    Or  acetaminophen (TYLENOL) suppository 650 mg (has no administration in time range)  senna-docusate (Senokot-S) tablet 1 tablet (has no administration in time range)  albuterol (PROVENTIL) (2.5 MG/3ML) 0.083% nebulizer solution 2.5 mg (has no administration in time range)  divalproex (DEPAKOTE ER) 24 hr tablet 500 mg (500 mg Oral Given 09/30/22 1526)  potassium chloride 10 mEq in 100 mL IVPB (10 mEq Intravenous New Bag/Given 09/30/22 1532)  methadone (DOLOPHINE) tablet 120 mg (has no administration in time range)  magnesium sulfate IVPB 4 g 100 mL (4 g Intravenous New Bag/Given 09/30/22 1605)  doxycycline (VIBRAMYCIN) 100 mg in sodium chloride 0.9 % 250 mL IVPB (has no administration in time range)  lidocaine (LIDODERM) 5 % 1 patch (1 patch Transdermal Patch Applied 09/30/22 1609)  acetaminophen (TYLENOL) tablet 1,000 mg (1,000 mg Oral Given 09/30/22 0815)  sodium chloride 0.9 % bolus 1,000 mL (0 mLs Intravenous Stopped 09/30/22 1005)  ceFEPIme (MAXIPIME) 2 g in sodium chloride 0.9 % 100 mL IVPB (0 g Intravenous Stopped 09/30/22 1004)  vancomycin (VANCOREADY) IVPB 1500 mg/300 mL (0 mg Intravenous Stopped 09/30/22 1335)  methadone (DOLOPHINE) tablet 120 mg (120 mg Oral Given 09/30/22 1031)  potassium chloride SA (KLOR-CON M) CR tablet 40 mEq (40 mEq Oral Given 09/30/22 1031)    Mobility walks     Focused Assessments Pulmonary Assessment Handoff:  Lung sounds:   O2 Device: Nasal Cannula (applied for SPO2 90-94% on RA) O2 Flow Rate (L/min): 1 L/min    R Recommendations: See Admitting Provider Note  Report given to:   Additional Notes: patient potassium is 2.6 she suppose to get 4 runs of potassium. Magnesium is 1.5 she is getting 4 gram of magnesium sulfate

## 2022-09-30 NOTE — Hospital Course (Signed)
[ ]    SOB 3L ->0L [place]  Cefepime Vanco - > PO tomorrow Coughing - non expectorated - no antitussives    Opioids on Wednesday Methadone - in the ED and scheduled [ ]  COWS  Seizure [ ]  Depakote restarted   Chest pain -  Hypokalemia X4 IV 40 mE PO [ ] 6 PM BMP  Track marks  Lives with grandparents

## 2022-10-01 DIAGNOSIS — E876 Hypokalemia: Secondary | ICD-10-CM

## 2022-10-01 DIAGNOSIS — J189 Pneumonia, unspecified organism: Secondary | ICD-10-CM

## 2022-10-01 LAB — CBC
HCT: 38.3 % (ref 36.0–46.0)
Hemoglobin: 13.2 g/dL (ref 12.0–15.0)
MCH: 32.9 pg (ref 26.0–34.0)
MCHC: 34.5 g/dL (ref 30.0–36.0)
MCV: 95.5 fL (ref 80.0–100.0)
Platelets: 118 10*3/uL — ABNORMAL LOW (ref 150–400)
RBC: 4.01 MIL/uL (ref 3.87–5.11)
RDW: 13.2 % (ref 11.5–15.5)
WBC: 7.4 10*3/uL (ref 4.0–10.5)
nRBC: 0 % (ref 0.0–0.2)

## 2022-10-01 MED ORDER — DOXYCYCLINE HYCLATE 100 MG PO TABS
100.0000 mg | ORAL_TABLET | Freq: Two times a day (BID) | ORAL | Status: DC
  Administered 2022-10-01 – 2022-10-02 (×2): 100 mg via ORAL
  Filled 2022-10-01 (×2): qty 1

## 2022-10-01 NOTE — Progress Notes (Signed)
Subjective:   Summary: Amber [name] is a 32 y.o. year old female currently admitted on the IMTS HD#1 for AHRF 2/2 multifocal pneumonia.  Overnight Events: NOE  Pt was seen bedside this AM. She states she is feeling better, and has stated that she is feeling hot. She is having lower back pain in the paraspinal areas, as well as in the buttock region. No other acute complaints, and all questions answered.   Objective:  Vital signs in last 24 hours: Vitals:   09/30/22 2054 10/01/22 0019 10/01/22 0357 10/01/22 0811  BP:  136/82 115/78 110/73  Pulse:      Resp: 20 20 20 18   Temp:  98.9 F (37.2 C) 98.7 F (37.1 C) 98.3 F (36.8 C)  TempSrc:  Oral Oral Oral  SpO2:      Weight:      Height:       Supplemental O2: Room Air On room air  Physical Exam:  Constitutional: Older than stated age appearing female, laying in bed, in no acute distress Cardiovascular: RRR, no murmurs, rubs or gallops Pulmonary/Chest: Decreased breath sounds heard on right middle lobe.  Abdominal: soft, non-tender, non-distended Skin: warm and dry Extremities: upper/lower extremity pulses 2+, no lower extremity edema present  [facility] Weights   09/30/22 0624  Weight: 79.4 kg     Intake/Output Summary (Last 24 hours) at 10/01/2022 1030 Last data filed at 10/01/2022 0019 Gross per 24 hour  Intake 682.75 ml  Output 1000 ml  Net -317.25 ml   Net IO Since Admission: -117.25 mL [10/01/22 1030]  Pertinent Labs:    Latest Ref Rng & Units 09/30/2022    8:45 AM 01/07/2020    1:54 AM 01/06/2020   12:17 PM  CBC  WBC 4.0 - 10.5 K/uL 12.8  5.4  6.7   Hemoglobin 12.0 - 15.0 g/dL 40.9  81.1  91.4   Hematocrit 36.0 - 46.0 % 34.8  40.3  39.5   Platelets 150 - 400 K/uL 180  277  289        Latest Ref Rng & Units 09/30/2022   11:21 PM 09/30/2022    8:45 AM 01/07/2020    1:54 AM  CMP  Glucose 70 - 99 mg/dL 782  956  92   BUN 6 - 20 mg/dL 5  <5  13   Creatinine 0.44 - 1.00 mg/dL 2.13  0.86   5.78   Sodium 135 - 145 mmol/L 131  134  137   Potassium 3.5 - 5.1 mmol/L 4.0  2.6  3.4   Chloride 98 - 111 mmol/L 100  97  105   CO2 22 - 32 mmol/L 25  27  20    Calcium 8.9 - 10.3 mg/dL 8.2  9.0  9.8   Total Protein 6.5 - 8.1 g/dL  6.7    Total Bilirubin 0.3 - 1.2 mg/dL  0.5    Alkaline Phos 38 - 126 U/L  41    AST 15 - 41 U/L  18    ALT 0 - 44 U/L  14        Imaging: CT Angio Chest/Abd/Pel for Dissection W and/or W/WO  Result Date: 09/30/2022 CLINICAL DATA:  Acute aortic syndrome suspected, pain EXAM: CT ANGIOGRAPHY CHEST, ABDOMEN AND PELVIS TECHNIQUE: Non-contrast CT of the chest was initially obtained. Multidetector CT imaging through the chest, abdomen and pelvis was performed using the standard  protocol during bolus administration of intravenous contrast. Multiplanar reconstructed images and MIPs were obtained and reviewed to evaluate the vascular anatomy. RADIATION DOSE REDUCTION: This exam was performed according to the departmental dose-optimization program which includes automated exposure control, adjustment of the mA and/or kV according to patient size and/or use of iterative reconstruction technique. CONTRAST:  OMNIPAQUE IOHEXOL 350 MG/ML SOLN COMPARISON:  Chest radiographs done today, CT abdomen and pelvis done on 05/28/2013 FINDINGS: CTA CHEST FINDINGS Cardiovascular: There is no demonstrable mural hematoma the thoracic aorta in the noncontrast images. There is homogeneous enhancement in thoracic aorta. There is no demonstrable intimal flap. There is ectasia of the main pulmonary artery measuring 3.7 cm. There are no intraluminal filling defects in central pulmonary artery branches. Evaluation of small peripheral branches is limited by motion artifacts and infiltrates. Mediastinum/Nodes: No significant lymphadenopathy is seen. Lungs/Pleura: There are large infiltrates with air bronchograms in both lower lobes. There are patchy alveolar infiltrates in both upper lobes, more so  in the posterior segment of right upper lobe. Small bilateral pleural effusions are seen. There is no pneumothorax. Mucous strands are noted in the luminal main bronchi. Musculoskeletal: S shaped scoliosis is noted in the thoracolumbar spine. No acute findings are seen. Review of the MIP images confirms the above findings. CTA ABDOMEN AND PELVIS FINDINGS VASCULAR Aorta: There is no dissection or aneurism Celiac: Appears normal SMA: Appears normal Renals: There are 2 left renal arteries. There is no stenosis in the renal arteries. IMA: Patent. Inflow: Unremarkable. Veins: Unremarkable. Review of the MIP images confirms the above findings. NON-VASCULAR Hepatobiliary: There is fatty infiltration in liver. There is minimal nodularity of the liver surface. There is low-density in liver close to falciform ligament, possibly aberrant venous drainage. There is no dilation of bile ducts. Gallbladder is unremarkable. Pancreas: No focal abnormalities are seen. Spleen: Unremarkable. Adrenals/Urinary Tract: Adrenals are unremarkable. There is no hydronephrosis. There are no renal or ureteral stones. Urinary bladder is unremarkable. Stomach/Bowel: Stomach is moderately distended with fluid. Small hiatal hernia is seen. Small bowel loops are not dilated. Appendix is not dilated. There is no significant wall thickening in colon. There is no pericolic stranding. Lymphatic: Unremarkable. Reproductive: There is 3.3 cm fluid density lesion in the left adnexa, possibly left ovarian cyst. There are no internal septations. Trace amount of free fluid is seen in pelvis. Other: There is no pneumoperitoneum. Umbilical hernia containing fat is seen. Musculoskeletal: S shaped scoliosis is seen in thoracic and lumbar spine. No acute findings are seen. Review of the MIP images confirms the above findings. IMPRESSION: There is no evidence of dissection in the thoracic and abdominal aorta. Major branches of thoracic and abdominal aorta appear  patent. There is ectasia of the main pulmonary artery measuring 3.7 cm suggesting possible pulmonary arterial hypertension. Large infiltrates are seen in both lower lobes. Small to moderate sized multiple infiltrates are seen in both upper lobes. Findings suggest multifocal pneumonia. Small bilateral pleural effusions are seen. There is no evidence of intestinal obstruction or pneumoperitoneum. There is no hydronephrosis. Appendix is not dilated. Fatty liver. There is minimal nodularity and liver surface which may be a normal variation or suggest early changes of cirrhosis. Please correlate with laboratory findings. Trace amount of free fluid in pelvis may be physiological. There is 3.3 cm cyst in the left adnexa, possibly functional ovarian cyst. Electronically Signed   By: Ernie Avena M.D.   On: 09/30/2022 19:47   ECHOCARDIOGRAM COMPLETE  Result Date: 09/30/2022  ECHOCARDIOGRAM REPORT   Patient Name:   LENAMARIE BURGHART Date of Exam: 09/30/2022 Medical Rec #:  [id number]     Height:       66.0 in Accession #:    [id number]    Weight:       175.0 lb Date of Birth:  06/18/1990     BSA:          1.889 m Patient Age:    32 years      BP:           120/85 mmHg Patient Gender: F             HR:           81 bpm. Exam Location:  Inpatient Procedure: 2D Echo, Color Doppler and Cardiac Doppler Indications:    Acute respiratory failure  History:        Patient has prior history of Echocardiogram examinations, most                 recent 01/01/2020. Acute hypoxic respiratory failure after IVDU.  Sonographer:    Milbert Coulter Referring Phys: Gwenevere Abbot IMPRESSIONS  1. Left ventricular ejection fraction, by estimation, is 70 to 75%. The left ventricle has hyperdynamic function. The left ventricle has no regional wall motion abnormalities. Left ventricular diastolic parameters were normal.  2. Right ventricular systolic function is normal. The right ventricular size is normal.  3. The mitral valve is normal in  structure. Trivial mitral valve regurgitation.  4. The aortic valve is tricuspid. Aortic valve regurgitation is not visualized. Aortic valve sclerosis is present, with no evidence of aortic valve stenosis. Comparison(s): The left ventricular function is unchanged. FINDINGS  Left Ventricle: Left ventricular ejection fraction, by estimation, is 70 to 75%. The left ventricle has hyperdynamic function. The left ventricle has no regional wall motion abnormalities. The left ventricular internal cavity size was normal in size. There is no left ventricular hypertrophy. Left ventricular diastolic parameters were normal. Right Ventricle: The right ventricular size is normal. Right vetricular wall thickness was not assessed. Right ventricular systolic function is normal. Left Atrium: Left atrial size was normal in size. Right Atrium: Right atrial size was normal in size. Pericardium: There is no evidence of pericardial effusion. Mitral Valve: The mitral valve is normal in structure. Trivial mitral valve regurgitation. Tricuspid Valve: The tricuspid valve is normal in structure. Tricuspid valve regurgitation is trivial. Aortic Valve: The aortic valve is tricuspid. Aortic valve regurgitation is not visualized. Aortic valve sclerosis is present, with no evidence of aortic valve stenosis. Aortic valve mean gradient measures 7.0 mmHg. Aortic valve peak gradient measures 13.4 mmHg. Aortic valve area, by VTI measures 2.27 cm. Pulmonic Valve: The pulmonic valve was normal in structure. Pulmonic valve regurgitation is not visualized. Aorta: The aortic root is normal in size and structure. IAS/Shunts: No atrial level shunt detected by color flow Doppler.  LEFT VENTRICLE PLAX 2D LVIDd:         4.50 cm   Diastology LVIDs:         2.60 cm   LV e' medial:    9.14 cm/s LV PW:         1.10 cm   LV E/e' medial:  10.6 LV IVS:        1.00 cm   LV e' lateral:   15.90 cm/s LVOT diam:     2.00 cm   LV E/e' lateral: 6.1 LV SV:  75 LV SV  Index:   40 LVOT Area:     3.14 cm  RIGHT VENTRICLE RV S prime:     12.50 cm/s TAPSE (M-mode): 2.2 cm LEFT ATRIUM             Index        RIGHT ATRIUM           Index LA diam:        3.40 cm 1.80 cm/m   RA Area:     14.70 cm LA Vol (A2C):   27.5 ml 14.55 ml/m  RA Volume:   36.50 ml  19.32 ml/m LA Vol (A4C):   33.8 ml 17.89 ml/m LA Biplane Vol: 30.8 ml 16.30 ml/m  AORTIC VALVE AV Area (Vmax):    2.27 cm AV Area (Vmean):   2.26 cm AV Area (VTI):     2.27 cm AV Vmax:           183.00 cm/s AV Vmean:          121.000 cm/s AV VTI:            0.329 m AV Peak Grad:      13.4 mmHg AV Mean Grad:      7.0 mmHg LVOT Vmax:         132.00 cm/s LVOT Vmean:        86.900 cm/s LVOT VTI:          0.238 m LVOT/AV VTI ratio: 0.72  AORTA Ao Root diam: 2.90 cm MITRAL VALVE MV Area (PHT): 3.63 cm    SHUNTS MV Decel Time: 209 msec    Systemic VTI:  0.24 m MV E velocity: 97.10 cm/s  Systemic Diam: 2.00 cm MV A velocity: 88.00 cm/s MV E/A ratio:  1.10 Dietrich Pates MD Electronically signed by Dietrich Pates MD Signature Date/Time: 09/30/2022/4:38:41 PM    Final      Assessment/Plan:   Principal Problem:   Acute hypoxic respiratory failure [facility])   Patient Summary: QUANIYA CALZADILLAS is a 32 y.o. with a pertinent PMH of seizure disorder and polysubstance abuse, who presented with shortness of breath and chest pain and admitted for AHRF 2/2 multifocal pneumonia.    #Acute Hypoxic Respiratory Failure  #Multifocal Pneumonia  CT angio confirmed multifocal pneumonia. Blood cultures are positive for gram positive rods in 1/4 tubes, likely contaminant as pt does not appear bacteremic at this moment. No new fevers overnight, and was able to completely wean off oxygen while in the room. Will transition patient to PO antibiotics today. Will also further await for cultures to grow, but low suspicion for bacteremia.   Plan:  - Stop IV Doxycycline, start PO doxycycline 100mg  twice a day  - If fever returns or if patient resp status  worsens, can broaden antibiotics with ceftriaxone and vancomycin and repeat blood cultures - F/U blood cultures  #Opioid Use Disorder  [facility], however unable to get in touch with them to confirm. Will continue methadone 120mg . Will need to monitor for signs of withdrawal, can use toradol or robaxin if patient does complain of pain. Monitor COWS.  #Seizure Disorder  Continuing home depakote 500mg    #Hypokalemia (resolved) Currently at 4.0  Diet: Normal IVF: None,None VTE: Enoxaparin Code: Full PT/OT recs: Pending, none. TOC recs: NA Family Update: Spouse bedside   Dispo: Anticipated discharge to Home in 3 days pending medical management.   Olegario Messier, MD PGY-1 Internal Medicine Resident Pager Number [phone] Please contact the  on call pager after 5 pm and on weekends at [phone].

## 2022-10-01 NOTE — Plan of Care (Signed)

## 2022-10-01 NOTE — Plan of Care (Signed)

## 2022-10-02 ENCOUNTER — Other Ambulatory Visit: Payer: Self-pay | Admitting: Student

## 2022-10-02 MED ORDER — METHOCARBAMOL 500 MG PO TABS: 500.0000 mg | ORAL_TABLET | Freq: Three times a day (TID) | ORAL | 0 refills | Status: AC | PRN

## 2022-10-02 MED ORDER — DIVALPROEX SODIUM ER 500 MG PO TB24: 500.0000 mg | ORAL_TABLET | Freq: Every day | ORAL | 0 refills | Status: DC

## 2022-10-02 MED ORDER — DOXYCYCLINE HYCLATE 100 MG PO TABS: 100.0000 mg | ORAL_TABLET | Freq: Two times a day (BID) | ORAL | 0 refills | Status: AC

## 2022-10-02 MED ORDER — ALBUTEROL SULFATE (2.5 MG/3ML) 0.083% IN NEBU: 2.5000 mg | INHALATION_SOLUTION | Freq: Four times a day (QID) | RESPIRATORY_TRACT | 12 refills | Status: DC | PRN

## 2022-10-02 NOTE — Plan of Care (Signed)

## 2022-10-02 NOTE — Plan of Care (Signed)

## 2022-10-02 NOTE — Discharge Summary (Signed)
Name: Amber [name] MRN: [id number] DOB: [birth year] 32 y.o. PCP: System, Provider Not In  Date of Admission: 09/30/2022  6:18 AM Date of Discharge: 10/02/2022 12:18 PM Attending Physician: Dr.  Lafonda Mosses  Discharge Diagnosis: Principal Problem:   Acute hypoxic respiratory failure (HCC) Active Problems:   Pneumonia due to infectious organism   Hypokalemia    Discharge Medications: Allergies as of 10/02/2022       Reactions   Biaxin [clarithromycin] Anaphylaxis        Medication List     STOP taking these medications    POTASSIUM CHLORIDE PO       TAKE these medications    acetaminophen 500 MG tablet Commonly known as: TYLENOL Take 1,000 mg by mouth 2 (two) times daily as needed for moderate pain, fever or headache.   albuterol (2.5 MG/3ML) 0.083% nebulizer solution Commonly known as: PROVENTIL Take 3 mLs (2.5 mg total) by nebulization every 6 (six) hours as needed for wheezing or shortness of breath.   divalproex 500 MG 24 hr tablet Commonly known as: DEPAKOTE ER Take 1 tablet (500 mg total) by mouth daily. Start taking on: October 03, 2022   doxycycline 100 MG tablet Commonly known as: VIBRA-TABS Take 1 tablet (100 mg total) by mouth every 12 (twelve) hours for 10 doses.   ibuprofen 200 MG tablet Commonly known as: ADVIL Take 800 mg by mouth 2 (two) times daily as needed for headache or moderate pain.   methadone 10 MG/ML solution Commonly known as: DOLOPHINE Take 120 mg by mouth daily.   methocarbamol 500 MG tablet Commonly known as: ROBAXIN Take 1 tablet (500 mg total) by mouth every 8 (eight) hours as needed for up to 14 days for muscle spasms.        Disposition and follow-up:   Amber [name] was discharged from [facility] in Stable condition.  At the hospital follow up visit please address:  1.  Follow-up:  Multifocal pneumonia, SOB, Right sided chest pain  - Patient started on PO doxycycline 100 BID, got her AM dose  7/28 before discharge. Will need to continue for 10 more doses, starting with PM dose 7/28. Patient complaining of intermittent SOB, on room air satting >94%, provided with PRN albuterol. Patient's associated right sided chest pain improved with PRN Robaxin. Follow-up on symptoms as patient continues to complete antibiotic treatment.   Methadone treatment - Patient seen at [facility] weekly for that week's methadone, currently on methadone 120 dose. Home dose continued on admission and daily dose for 7/28 given before discharge. Per patient, will follow up 7/29.   Potassium, Magnesium derangements - Patient presented with Potassium 2.6 and Magnesium 1.5 on admission, both repleted. Potassium 4.0 7/27. Please follow up with potassium and magnesium labs to determine if ongoing or one time repletion is needed.   2.  Labs / imaging needed at time of follow-up: BMP (K), CBC (WBC), Magnesium   3.  Pending labs/ test needing follow-up: N/A  4.  Medication Changes  STOPPED  - Potassium chloride tablet  - Lidocaine patch   - Toradol (given IV injections on admission)   ADDED  - Doxycycline 100 BID (complete 7 day course, 10 doses left)  - Albuterol nebulizer  - Robaxin 500 mg PRN    MODIFIED  - N/A  Follow-up Appointments: - [facility] for methadone follow-up 10/03/22 - Follow up with [facility], put in request for 7-10 day follow-up   Hospital Course  by problem list: Patient Summary: Amber [name] is a 32 y.o. with a pertinent PMH of seizure disorder and polysubstance abuse, who presented with shortness of breath and chest pain and admitted for AHRF due to multifocal pneumonia. EKG and serial troponins ruled out ACS.    #Acute Hypoxic Respiratory Failure  #Multifocal Pneumonia  Patient's CT angiography of chest, abdomen, and pelvis showed multifocal pneumonia. Patient was initially placed on 4L O2 via nasal cannula to help improve shortness of breath. Blood cultures  obtained on admission were positive for gram positive rods but patient did not appear bacteremic and it is likely a contaminant of the samples. Patient remained afebrile 7/27 and 7/28 and was able to wean off supplemental oxygen and to room air. Patient received one dose of IV Cefepime and one of IV Vancomycin, then was transitioned to IV doxycycline for one dose before starting oral doxycycline 7/27 in the evening for a planned 7-day course, with 10 doses remaining on discharge on 7/28. Denied fevers, chills, nausea, or vomiting on day of discharge. Continues to feel some right sided rib/chest pain with deep inhalation/movement that improves with PRN pain medication, and some intermittent SOB but is able to recover and remains saturating >94% on room air.    #Opioid Use Disorder  Per patient, she follows with [facility] on a regular basis. Continued home dose of methadone 120 mg daily during admission. Monitored COWS and signs of withdrawal, and provided Toradol and Robaxin PRN as needed for pain. Patient received her daily dose of methadone 120 mg on day of discharge and will follow-up with [facility] clinic tomorrow, Monday 10/03/22.    #Seizure Disorder  No signs of new seizure during admission. Continued patient's home dose of Depakote 500 mg daily.   #Hypokalemia Patient noted to be hypokalemic on admission of 2.6. 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 Patient received potassium chloride 10 mEq x 4 doses on day of admission. Potassium improved to 4.0 on labs 7/27. Patient's vein too poor for blood draw and limited amount extracted from peripheral IV so unable to draw morning labs on day of discharge. With potassium 4.0 and patient medically stable, recommend following up outpatient to determine continued need for PO repletion at home.     Discharge Subjective: Overnight, could not draw labs due to poor veins. Has IV  access on left peripheral vein. Will encourage increased PO intake and will order foot stick if not improved by mid morning.   Patient was resting in bed with eyes closed but able to sit up and converse appropriately with team with a verbal prompting. She was alert and oriented to situation. Denied CP, N/V, headache, abdominal pain, or problems with urination/BM. Endorsed right rib cage/right side pain that is improved from yesterday with Robaxin. Continues to feel some shortness of breath intermittently, including at night or with activity. Does not happen all the time. On room air now and saturating fine.   Discharge Exam:   Blood pressure 120/85, pulse 75, temperature 98.4 F (36.9 C), temperature source Oral, resp. rate 15, height 5\' 6"  (1.676 m), weight 79.4 kg, last menstrual period 09/05/2022, SpO2 96%.  Constitutional: Patient resting in bed in no acute distress.  HENT: Normocephalic atraumatic, mucous membranes moist. Cardiovascular: Regular rate and rhythm, no m/r/g Pulmonary/Chest: Normal work of breathing on room air, crackles heard posterior lower lobes bilaterally.  Abdominal: Soft, non-tender, non-distended. Tender along right rib cage/right chest below  right breast on palpation.  Neurological: Alert and oriented; patient answering and asking questions appropriately.  MSK: No gross abnormalities, no pitting edema.  Skin: Warm and dry. Numerous track mark scars and excoriations on bilateral upper extremities. A couple of flaccid/deflated bullae observed on dorsal surface of left hand.  Psych: Normal mood and affect.  Pertinent Labs, Studies, and Procedures:     Latest Ref Rng & Units 10/01/2022    9:02 PM 09/30/2022    8:45 AM 01/07/2020    1:54 AM  CBC  WBC 4.0 - 10.5 K/uL 7.4  12.8  5.4   Hemoglobin 12.0 - 15.0 g/dL 16.1  09.6  04.5   Hematocrit 36.0 - 46.0 % 38.3  34.8  40.3   Platelets 150 - 400 K/uL 118  180  277        Latest Ref Rng & Units 09/30/2022   11:21 PM  09/30/2022    8:45 AM 01/07/2020    1:54 AM  CMP  Glucose 70 - 99 mg/dL 409  811  92   BUN 6 - 20 mg/dL 5  <5  13   Creatinine 0.44 - 1.00 mg/dL 9.14  7.82  9.56   Sodium 135 - 145 mmol/L 131  134  137   Potassium 3.5 - 5.1 mmol/L 4.0  2.6  3.4   Chloride 98 - 111 mmol/L 100  97  105   CO2 22 - 32 mmol/L 25  27  20    Calcium 8.9 - 10.3 mg/dL 8.2  9.0  9.8   Total Protein 6.5 - 8.1 g/dL  6.7    Total Bilirubin 0.3 - 1.2 mg/dL  0.5    Alkaline Phos 38 - 126 U/L  41    AST 15 - 41 U/L  18    ALT 0 - 44 U/L  14     Magnesium 7/26: 1.5 mg/dL  CT Angio Chest/Abd/Pel for Dissection W and/or W/WO  Result Date: 09/30/2022 CLINICAL DATA:  Acute aortic syndrome suspected, pain EXAM: CT ANGIOGRAPHY CHEST, ABDOMEN AND PELVIS TECHNIQUE: Non-contrast CT of the chest was initially obtained. Multidetector CT imaging through the chest, abdomen and pelvis was performed using the standard protocol during bolus administration of intravenous contrast. Multiplanar reconstructed images and MIPs were obtained and reviewed to evaluate the vascular anatomy. RADIATION DOSE REDUCTION: This exam was performed according to the departmental dose-optimization program which includes automated exposure control, adjustment of the mA and/or kV according to patient size and/or use of iterative reconstruction technique. CONTRAST:  OMNIPAQUE IOHEXOL 350 MG/ML SOLN COMPARISON:  Chest radiographs done today, CT abdomen and pelvis done on 05/28/2013 FINDINGS: CTA CHEST FINDINGS Cardiovascular: There is no demonstrable mural hematoma the thoracic aorta in the noncontrast images. There is homogeneous enhancement in thoracic aorta. There is no demonstrable intimal flap. There is ectasia of the main pulmonary artery measuring 3.7 cm. There are no intraluminal filling defects in central pulmonary artery branches. Evaluation of small peripheral branches is limited by motion artifacts and infiltrates. Mediastinum/Nodes: No significant  lymphadenopathy is seen. Lungs/Pleura: There are large infiltrates with air bronchograms in both lower lobes. There are patchy alveolar infiltrates in both upper lobes, more so in the posterior segment of right upper lobe. Small bilateral pleural effusions are seen. There is no pneumothorax. Mucous strands are noted in the luminal main bronchi. Musculoskeletal: S shaped scoliosis is noted in the thoracolumbar spine. No acute findings are seen. Review of the MIP images confirms the above findings. CTA ABDOMEN  AND PELVIS FINDINGS VASCULAR Aorta: There is no dissection or aneurism Celiac: Appears normal SMA: Appears normal Renals: There are 2 left renal arteries. There is no stenosis in the renal arteries. IMA: Patent. Inflow: Unremarkable. Veins: Unremarkable. Review of the MIP images confirms the above findings. NON-VASCULAR Hepatobiliary: There is fatty infiltration in liver. There is minimal nodularity of the liver surface. There is low-density in liver close to falciform ligament, possibly aberrant venous drainage. There is no dilation of bile ducts. Gallbladder is unremarkable. Pancreas: No focal abnormalities are seen. Spleen: Unremarkable. Adrenals/Urinary Tract: Adrenals are unremarkable. There is no hydronephrosis. There are no renal or ureteral stones. Urinary bladder is unremarkable. Stomach/Bowel: Stomach is moderately distended with fluid. Small hiatal hernia is seen. Small bowel loops are not dilated. Appendix is not dilated. There is no significant wall thickening in colon. There is no pericolic stranding. Lymphatic: Unremarkable. Reproductive: There is 3.3 cm fluid density lesion in the left adnexa, possibly left ovarian cyst. There are no internal septations. Trace amount of free fluid is seen in pelvis. Other: There is no pneumoperitoneum. Umbilical hernia containing fat is seen. Musculoskeletal: S shaped scoliosis is seen in thoracic and lumbar spine. No acute findings are seen. Review of the MIP  images confirms the above findings. IMPRESSION: There is no evidence of dissection in the thoracic and abdominal aorta. Major branches of thoracic and abdominal aorta appear patent. There is ectasia of the main pulmonary artery measuring 3.7 cm suggesting possible pulmonary arterial hypertension. Large infiltrates are seen in both lower lobes. Small to moderate sized multiple infiltrates are seen in both upper lobes. Findings suggest multifocal pneumonia. Small bilateral pleural effusions are seen. There is no evidence of intestinal obstruction or pneumoperitoneum. There is no hydronephrosis. Appendix is not dilated. Fatty liver. There is minimal nodularity and liver surface which may be a normal variation or suggest early changes of cirrhosis. Please correlate with laboratory findings. Trace amount of free fluid in pelvis may be physiological. There is 3.3 cm cyst in the left adnexa, possibly functional ovarian cyst. Electronically Signed   By: Ernie Avena M.D.   On: 09/30/2022 19:47   ECHOCARDIOGRAM COMPLETE  Result Date: 09/30/2022    ECHOCARDIOGRAM REPORT   Patient Name:   Amber [name] Date of Exam: 09/30/2022 Medical Rec #:  [id number]     Height:       66.0 in Accession #:    [id number]    Weight:       175.0 lb Date of Birth:  [birth year]     BSA:          1.889 m Patient Age:    32 years      BP:           120/85 mmHg Patient Gender: F             HR:           81 bpm. Exam Location:  Inpatient Procedure: 2D Echo, Color Doppler and Cardiac Doppler Indications:    Acute respiratory failure  History:        Patient has prior history of Echocardiogram examinations, most                 recent 01/01/2020. Acute hypoxic respiratory failure after IVDU.  Sonographer:    Milbert Coulter Referring Phys: Gwenevere Abbot IMPRESSIONS  1. Left ventricular ejection fraction, by estimation, is 70 to 75%. The left ventricle has hyperdynamic function. The left ventricle has no regional wall motion  abnormalities. Left  ventricular diastolic parameters were normal.  2. Right ventricular systolic function is normal. The right ventricular size is normal.  3. The mitral valve is normal in structure. Trivial mitral valve regurgitation.  4. The aortic valve is tricuspid. Aortic valve regurgitation is not visualized. Aortic valve sclerosis is present, with no evidence of aortic valve stenosis. Comparison(s): The left ventricular function is unchanged. FINDINGS  Left Ventricle: Left ventricular ejection fraction, by estimation, is 70 to 75%. The left ventricle has hyperdynamic function. The left ventricle has no regional wall motion abnormalities. The left ventricular internal cavity size was normal in size. There is no left ventricular hypertrophy. Left ventricular diastolic parameters were normal. Right Ventricle: The right ventricular size is normal. Right vetricular wall thickness was not assessed. Right ventricular systolic function is normal. Left Atrium: Left atrial size was normal in size. Right Atrium: Right atrial size was normal in size. Pericardium: There is no evidence of pericardial effusion. Mitral Valve: The mitral valve is normal in structure. Trivial mitral valve regurgitation. Tricuspid Valve: The tricuspid valve is normal in structure. Tricuspid valve regurgitation is trivial. Aortic Valve: The aortic valve is tricuspid. Aortic valve regurgitation is not visualized. Aortic valve sclerosis is present, with no evidence of aortic valve stenosis. Aortic valve mean gradient measures 7.0 mmHg. Aortic valve peak gradient measures 13.4 mmHg. Aortic valve area, by VTI measures 2.27 cm. Pulmonic Valve: The pulmonic valve was normal in structure. Pulmonic valve regurgitation is not visualized. Aorta: The aortic root is normal in size and structure. IAS/Shunts: No atrial level shunt detected by color flow Doppler.  LEFT VENTRICLE PLAX 2D LVIDd:         4.50 cm   Diastology LVIDs:         2.60 cm   LV e' medial:    9.14 cm/s LV  PW:         1.10 cm   LV E/e' medial:  10.6 LV IVS:        1.00 cm   LV e' lateral:   15.90 cm/s LVOT diam:     2.00 cm   LV E/e' lateral: 6.1 LV SV:         75 LV SV Index:   40 LVOT Area:     3.14 cm  RIGHT VENTRICLE RV S prime:     12.50 cm/s TAPSE (M-mode): 2.2 cm LEFT ATRIUM             Index        RIGHT ATRIUM           Index LA diam:        3.40 cm 1.80 cm/m   RA Area:     14.70 cm LA Vol (A2C):   27.5 ml 14.55 ml/m  RA Volume:   36.50 ml  19.32 ml/m LA Vol (A4C):   33.8 ml 17.89 ml/m LA Biplane Vol: 30.8 ml 16.30 ml/m  AORTIC VALVE AV Area (Vmax):    2.27 cm AV Area (Vmean):   2.26 cm AV Area (VTI):     2.27 cm AV Vmax:           183.00 cm/s AV Vmean:          121.000 cm/s AV VTI:            0.329 m AV Peak Grad:      13.4 mmHg AV Mean Grad:      7.0 mmHg LVOT Vmax:  132.00 cm/s LVOT Vmean:        86.900 cm/s LVOT VTI:          0.238 m LVOT/AV VTI ratio: 0.72  AORTA Ao Root diam: 2.90 cm MITRAL VALVE MV Area (PHT): 3.63 cm    SHUNTS MV Decel Time: 209 msec    Systemic VTI:  0.24 m MV E velocity: 97.10 cm/s  Systemic Diam: 2.00 cm MV A velocity: 88.00 cm/s MV E/A ratio:  1.10 Dietrich Pates MD Electronically signed by Dietrich Pates MD Signature Date/Time: 09/30/2022/4:38:41 PM    Final    DG Chest 2 View  Result Date: 09/30/2022 CLINICAL DATA:  32 year old female with shortness of breath. EXAM: CHEST - 2 VIEW COMPARISON:  Portable chest 01/01/2020 and earlier. FINDINGS: Upright PA and lateral views at 0636 hours. Lower lung volumes on both views. Normal cardiac size and mediastinal contours. Visualized tracheal air column is within normal limits. Symmetric increased bilateral pulmonary interstitial opacity, patchy bilateral lung base opacity. No pneumothorax. No convincing pleural effusion. No consolidation. Thoracic scoliosis is chronic. No acute osseous abnormality identified. Negative visible bowel gas. IMPRESSION: Low lung volumes with atelectasis. Superimposed increased interstitial  opacity is nonspecific with differential considerations of viral/atypical respiratory infection, pulmonary interstitial edema. No pleural effusion. Electronically Signed   By: Odessa Fleming M.D.   On: 09/30/2022 06:50     Discharge Instructions: Discharge Instructions     Call MD for:  difficulty breathing, headache or visual disturbances   Complete by: As directed    Call MD for:  extreme fatigue   Complete by: As directed    Call MD for:  hives   Complete by: As directed    Call MD for:  persistant dizziness or light-headedness   Complete by: As directed    Call MD for:  persistant nausea and vomiting   Complete by: As directed    Call MD for:  redness, tenderness, or signs of infection (pain, swelling, redness, odor or green/yellow discharge around incision site)   Complete by: As directed    Call MD for:  severe uncontrolled pain   Complete by: As directed    Call MD for:  temperature >100.4   Complete by: As directed    Diet general   Complete by: As directed    Discharge instructions   Complete by: As directed    PATIENT INSTRUCTIONS:   - You were seen for acute shortness of breath, chest pain, and recent fever at home. You were found to have multifocal pneumonia and were started on antibiotics.   - You will complete a 7-day course of Doxycycline. You will take one pill of Doxycycline 100 mg every 12 hours, starting with an evening dose on Sunday 7/28, then 7/29 with a morning and evening dose, until you have finished all 10 pills.   - You will follow up with [facility] Monday 7/29 to get your continued methadone 120 dose.   - You were also given prescriptions for Robaxin, which helped with your right sided rib/chest pain, as well as Depakote daily, and an albuterol/Proventil nebulizer solution to help with your shortness of breath.   - We will reach out to the [facility] located here at the hospital to schedule you for a hospital follow-up  appointment in the next 7-10 days. We will try to request a Tuesday or Thursday morning. They will call you do coordinate and confirm a day and time that works best for you.   -  Please call the [facility] during office hours (8:00 AM - 5 PM) at [phone] if you have any questions regarding your care. Please call [phone] if you are having a medical emergency, such as shortness of breath, chest pain, fever and chills, or can't keep anything down due to nausea and vomiting.   It was a pleasure serving you during your stay with [place]! - Dr. Justin Mend and team   Increase activity slowly   Complete by: As directed       Signed:  Colbert Coyer, MD [facility] - PGY1 Pager: [phone] 10/02/2022, 12:18 PM    Please contact the on call pager after 5 pm and on weekends at [phone].

## 2022-10-20 ENCOUNTER — Ambulatory Visit: Payer: MEDICAID | Admitting: Student

## 2022-11-15 ENCOUNTER — Encounter: Payer: MEDICAID | Admitting: Internal Medicine

## 2023-01-03 ENCOUNTER — Other Ambulatory Visit (HOSPITAL_COMMUNITY): Payer: Self-pay

## 2023-05-11 ENCOUNTER — Other Ambulatory Visit (HOSPITAL_COMMUNITY): Payer: Self-pay

## 2024-02-02 ENCOUNTER — Emergency Department (HOSPITAL_COMMUNITY)
Admission: EM | Admit: 2024-02-02 | Discharge: 2024-02-03 | Disposition: A | Discharge status: Home / Self Care | Payer: MEDICAID | Attending: Emergency Medicine | Admitting: Emergency Medicine

## 2024-02-02 ENCOUNTER — Emergency Department (HOSPITAL_COMMUNITY): Payer: MEDICAID

## 2024-02-02 ENCOUNTER — Encounter (HOSPITAL_COMMUNITY): Payer: Self-pay

## 2024-02-02 ENCOUNTER — Other Ambulatory Visit: Payer: Self-pay

## 2024-02-02 DIAGNOSIS — G43809 Other migraine, not intractable, without status migrainosus: Secondary | ICD-10-CM | POA: Diagnosis not present

## 2024-02-02 DIAGNOSIS — F1190 Opioid use, unspecified, uncomplicated: Secondary | ICD-10-CM | POA: Insufficient documentation

## 2024-02-02 DIAGNOSIS — R519 Headache, unspecified: Secondary | ICD-10-CM | POA: Diagnosis present

## 2024-02-02 DIAGNOSIS — R4182 Altered mental status, unspecified: Secondary | ICD-10-CM | POA: Insufficient documentation

## 2024-02-02 LAB — COMPREHENSIVE METABOLIC PANEL WITH GFR
ALT: 24 U/L (ref 0–44)
AST: 21 U/L (ref 15–41)
Albumin: 3.8 g/dL (ref 3.5–5.0)
Alkaline Phosphatase: 49 U/L (ref 38–126)
Anion gap: 8 (ref 5–15)
BUN: 5 mg/dL — ABNORMAL LOW (ref 6–20)
CO2: 24 mmol/L (ref 22–32)
Calcium: 9.1 mg/dL (ref 8.9–10.3)
Chloride: 105 mmol/L (ref 98–111)
Creatinine, Ser: 0.79 mg/dL (ref 0.44–1.00)
GFR, Estimated: >60 mL/min (ref >60)
Glucose, Bld: 106 mg/dL — ABNORMAL HIGH (ref 70–99)
Potassium: 3.4 mmol/L — ABNORMAL LOW (ref 3.5–5.1)
Sodium: 137 mmol/L (ref 135–145)
Total Bilirubin: 0.2 mg/dL (ref 0.0–1.2)
Total Protein: 7.4 g/dL (ref 6.5–8.1)

## 2024-02-02 LAB — RESP PANEL BY RT-PCR (RSV, FLU A&B, COVID)  RVPGX2
Influenza A by PCR: NEGATIVE
Influenza B by PCR: NEGATIVE
Resp Syncytial Virus by PCR: NEGATIVE
SARS Coronavirus 2 by RT PCR: NEGATIVE

## 2024-02-02 LAB — CBC WITH DIFFERENTIAL/PLATELET
Abs Immature Granulocytes: 0.01 K/uL (ref 0.00–0.07)
Basophils Absolute: 0 K/uL (ref 0.0–0.1)
Basophils Relative: 0 %
Eosinophils Absolute: 0 K/uL (ref 0.0–0.5)
Eosinophils Relative: 0 %
HCT: 36.1 % (ref 36.0–46.0)
Hemoglobin: 12.2 g/dL (ref 12.0–15.0)
Immature Granulocytes: 0 %
Lymphocytes Relative: 20 %
Lymphs Abs: 1.2 K/uL (ref 0.7–4.0)
MCH: 30.7 pg (ref 26.0–34.0)
MCHC: 33.8 g/dL (ref 30.0–36.0)
MCV: 90.9 fL (ref 80.0–100.0)
Monocytes Absolute: 0.3 K/uL (ref 0.1–1.0)
Monocytes Relative: 4 %
Neutro Abs: 4.6 K/uL (ref 1.7–7.7)
Neutrophils Relative %: 76 %
Platelets: 266 K/uL (ref 150–400)
RBC: 3.97 MIL/uL (ref 3.87–5.11)
RDW: 13.2 % (ref 11.5–15.5)
WBC: 6.1 K/uL (ref 4.0–10.5)
nRBC: 0 % (ref 0.0–0.2)

## 2024-02-02 LAB — RAPID URINE DRUG SCREEN, HOSP PERFORMED
Amphetamines: POSITIVE — AB
Barbiturates: NOT DETECTED
Benzodiazepines: NOT DETECTED
Cocaine: NOT DETECTED
Opiates: POSITIVE — AB
Tetrahydrocannabinol: NOT DETECTED

## 2024-02-02 LAB — URINALYSIS, ROUTINE W REFLEX MICROSCOPIC
Bilirubin Urine: NEGATIVE
Glucose, UA: NEGATIVE mg/dL
Ketones, ur: NEGATIVE mg/dL
Leukocytes,Ua: NEGATIVE
Nitrite: NEGATIVE
Protein, ur: NEGATIVE mg/dL
Specific Gravity, Urine: 1.01 (ref 1.005–1.030)
pH: 7 (ref 5.0–8.0)

## 2024-02-02 LAB — URINALYSIS, MICROSCOPIC (REFLEX)

## 2024-02-02 LAB — TROPONIN I (HIGH SENSITIVITY): Troponin I (High Sensitivity): <2 ng/L (ref <18)

## 2024-02-02 LAB — HCG, SERUM, QUALITATIVE: Preg, Serum: NEGATIVE

## 2024-02-02 LAB — LIPASE, BLOOD: Lipase: 21 U/L (ref 11–51)

## 2024-02-02 LAB — MAGNESIUM: Magnesium: 1.6 mg/dL — ABNORMAL LOW (ref 1.7–2.4)

## 2024-02-02 MED ORDER — ONDANSETRON HCL 4 MG/2ML IJ SOLN: 4.0000 mg | Freq: Once | INTRAMUSCULAR | Status: DC

## 2024-02-02 MED ORDER — PROCHLORPERAZINE EDISYLATE 10 MG/2ML IJ SOLN
10.0000 mg | Freq: Once | INTRAMUSCULAR | Status: AC
  Administered 2024-02-02: 10 mg via INTRAVENOUS
  Filled 2024-02-02: qty 2

## 2024-02-02 MED ORDER — DIPHENHYDRAMINE HCL 50 MG/ML IJ SOLN
50.0000 mg | Freq: Once | INTRAMUSCULAR | Status: AC
  Administered 2024-02-02: 50 mg via INTRAVENOUS
  Filled 2024-02-02: qty 1

## 2024-02-02 MED ORDER — POTASSIUM CHLORIDE CRYS ER 20 MEQ PO TBCR
40.0000 meq | EXTENDED_RELEASE_TABLET | Freq: Once | ORAL | Status: AC
  Administered 2024-02-02: 40 meq via ORAL
  Filled 2024-02-02: qty 2

## 2024-02-02 MED ORDER — METHADONE HCL 10 MG/ML PO CONC: 120.0000 mg | Freq: Every day | ORAL | Status: DC

## 2024-02-02 MED ORDER — MAGNESIUM OXIDE -MG SUPPLEMENT 400 (240 MG) MG PO TABS
400.0000 mg | ORAL_TABLET | Freq: Once | ORAL | Status: AC
  Administered 2024-02-02: 400 mg via ORAL
  Filled 2024-02-02: qty 1

## 2024-02-02 MED ORDER — LACTATED RINGERS IV BOLUS
1000.0000 mL | Freq: Once | INTRAVENOUS | Status: AC
  Administered 2024-02-02: 1000 mL via INTRAVENOUS

## 2024-02-02 NOTE — ED Notes (Signed)
 Pt back from CT, still reporting it is 2026 and that she is here for not feeling well after not taking methadone  since Sunday dt going out of town for thanksgiving. Oriented to person and place.

## 2024-02-02 NOTE — ED Triage Notes (Addendum)
 Family reports theses symptoms started 2 days ago. And she is now feeling nauseous in triage. She is saying she has a headache has the chills and aches all over.

## 2024-02-02 NOTE — ED Notes (Signed)
 Mother reports prior to boyfriend leaving, giving pt methadone  dose. MD at aware and bedside.

## 2024-02-02 NOTE — ED Provider Notes (Signed)
 I assumed care of patient at shift change. In brief, patient was brought in for altered mental status, then after evaluation it was felt to be due to underlying substance use disorder. Just prior to discharge, patient's boyfriend showed up and gave her a methadone  dose.  Patient was somnolent and not able to be discharged  When I entered in the room, patient is awake and alert and talking. Family is at bedside.  They would like to commit patient to go to rehab.  I informed them that we are unable to involuntary commit someone for the purposes of entering drug rehab.  Then another family member reports that patient has been suicidal.  Patient denies any suicidal thoughts.  The family then reiterates the patient is suicidal.  Will plan for psychiatric evaluation   Midge Golas, MD 02/03/24 0000

## 2024-02-02 NOTE — ED Notes (Signed)
 Pt put on end-tidal per MD. Pt drowsy but did awaken when putting on end tidal. Mother at bedside

## 2024-02-02 NOTE — ED Notes (Addendum)
 IV team at bedside

## 2024-02-02 NOTE — ED Provider Notes (Signed)
 I saw and evaluated the patient, reviewed the resident's note and I agree with the findings and plan.  EKG Interpretation Date/Time:  Friday February 02 2024 20:59:59 EST Ventricular Rate:  94 PR Interval:  144 QRS Duration:  106 QT Interval:  377 QTC Calculation: 472 R Axis:   93  Text Interpretation: Sinus rhythm Borderline right axis deviation Confirmed by Dasie Faden ([id number]) on 02/02/2024 10:14:34 PM   Patient is EKG shows normal sinus rhythm.  Patient originally presented for altered status.  Patient admits that she has not had her methadone  for several days.  States that she was out of town.  States that she is going to get it refilled tomorrow.  UDS here is positive for opiates as well as amphetamines.  She denies any amphetamine use at this time.  Labs here are reassuring as well as her head CT and chest x-ray.  Patient has no evidence of opiate withdrawal at this time.  She will need to follow-up with her clinic tomorrow   Dasie Faden, MD 02/02/24 [id number]

## 2024-02-02 NOTE — Discharge Instructions (Signed)
 I would recommend following up to your pharmacy to receive your methadone  treatment. You will likely be tested when you are evaluated to receive your methadone .

## 2024-02-02 NOTE — ED Triage Notes (Signed)
 Patient bib family for altered mental status. She is not making sense when she is talking. She is not following instructions well and she is repeating things over and over. 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 Patient A&Ox2. Alert to self and knows she is at the hospital. She thinks it is 2026 and thinks she was brought to the hospital due to not feeling well this morning.

## 2024-02-02 NOTE — ED Provider Notes (Signed)
 [place] EMERGENCY DEPARTMENT AT [facility] Provider Note   CSN: [id number] Arrival date & time: 02/02/24  8046     Patient presents with: Altered Mental Status   Amber [name] is a 33 y.o. female.  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  History per patient family.  Per report, patient and family traveled for Thanksgiving, patient was acting more delirious with time, and stopped taking her methadone  3 days ago.  Family endorse that patient has been developing worsening delirium, and has been asking repetitive questioning, and they are concerned about her mental status.  Patient family states that this is similar to her prior episode of meningitis, however patient has been afebrile.  Patient Dors that she feels chilly all over, and is actively shaking mildly throughout evaluation.  She endorses that she has been not feeling well, and therefore that is why she stopped taking her methadone .  She also endorses she has been having a headache for 3 days, and it is making her feel nauseous.  Patient family denies episodes of vomiting.  Denies recent head trauma.  Reportedly takes 145 mg of methadone  daily.  {Add pertinent medical, surgical, social history, OB history to YEP:67052}  Altered Mental Status      Prior to Admission medications   Medication Sig Start Date End Date Taking? Authorizing Provider  acetaminophen  (TYLENOL ) 500 MG tablet Take 1,000 mg by mouth 2 (two) times daily as needed for moderate pain, fever or headache.    [provider]  albuterol  (PROVENTIL ) (2.5 MG/3ML) 0.083% nebulizer solution Take 3 mLs (2.5 mg total) by nebulization every 6 (six) hours as needed for wheezing or shortness of breath. 10/02/22   Arellano Zameza, Priscila, MD  divalproex  (DEPAKOTE  ER) 500 MG 24 hr tablet Take 1 tablet (500 mg  total) by mouth daily. 10/03/22   Arellano Zameza, Priscila, MD  ibuprofen  (ADVIL ) 200 MG tablet Take 800 mg by mouth 2 (two) times daily as needed for headache or moderate pain.    [provider]  methadone  (DOLOPHINE ) 10 MG/ML solution Take 120 mg by mouth daily.    [provider]    Allergies: Biaxin [clarithromycin]    Review of Systems  Updated Vital Signs BP 133/77   Pulse (!) 109   Temp 98.7 F (37.1 C)   Resp 16   SpO2 100%   Physical Exam Vitals and nursing note reviewed.  Constitutional:      General: She is in acute distress.     Appearance: She is ill-appearing.  HENT:     Head: Normocephalic and atraumatic.     Mouth/Throat:     Mouth: Mucous membranes are dry.     Pharynx: Oropharynx is clear.  Eyes:     Extraocular Movements: Extraocular movements intact.     Pupils: Pupils are equal, round, and reactive to light.  Neck:     Comments: Negative Kernig and Brudzinski testing Cardiovascular:     Rate and Rhythm: Normal rate and regular rhythm.     Pulses: Normal pulses.     Heart sounds: Normal heart sounds. No murmur heard.    No gallop.  Pulmonary:     Effort: Pulmonary effort is normal. No respiratory distress.     Breath sounds: Normal breath sounds. No stridor. No wheezing, rhonchi or rales.  Abdominal:     General: Abdomen is flat. There is  no distension.     Palpations: Abdomen is soft.     Tenderness: There is no abdominal tenderness. There is no right CVA tenderness, left CVA tenderness, guarding or rebound.  Musculoskeletal:        General: No swelling or deformity.     Cervical back: Normal range of motion and neck supple. No rigidity or tenderness.  Skin:    General: Skin is warm.     Capillary Refill: Capillary refill takes 2 to 3 seconds.  Neurological:     General: No focal deficit present.     Mental Status: She is alert.     Cranial Nerves: No cranial nerve deficit.     Sensory: No sensory deficit.     Motor: No  weakness.     Comments: Oriented x 2, repeatedly saying that it is 2026.  Patient is physically shaking throughout evaluation, able to extend her legs, and she states that she just feels very chilly all over.     (all labs ordered are listed, but only abnormal results are displayed) Labs Reviewed - No data to display  EKG: None  Radiology: No results found.  {Document cardiac monitor, telemetry assessment procedure when appropriate:32947} Procedures   Medications Ordered in the ED - No data to display    {Click here for ABCD2, HEART and other calculators REFRESH Note before signing:1}                              Medical Decision Making Amount and/or Complexity of Data Reviewed Labs: ordered. Radiology: ordered.  Risk Prescription drug management.   ***  {Document critical care time when appropriate  Document review of labs and clinical decision tools ie CHADS2VASC2, etc  Document your independent review of radiology images and any outside records  Document your discussion with family members, caretakers and with consultants  Document social determinants of health affecting pt's care  Document your decision making why or why not admission, treatments were needed:32947:::1}   Final diagnoses:  None    ED Discharge Orders     None

## 2024-02-03 NOTE — ED Notes (Signed)
 Family reporting pt is suicidal and psych eval. MD at bedside when family reported this. pt has denied any SI/HI. Currently connected monitors and resting.
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
## 2024-02-03 NOTE — ED Notes (Signed)
 Upon trying to have pt dress in blue scrubs, pt is wanting to leave AMA and not needing any psych eval, you can talk with them (gesturing to family). I have never said I would hurt myself and never will. Pt started talking off blood pressure cuff and pulse ox, end tidal. MD made aware and will come talk to pt, pt aware and agreeable to wait. Family asked to step out so MD can talk with pt one on one. Pt agreeable, denies any SI/HI. Reports initially seeking help for not feeling well and nothing with harming myself. Pt sitting in bed, AOX3- stated it was 2026 but was able to given month and day correct.

## 2024-02-03 NOTE — BH Assessment (Signed)
 Clinician spoke toIRIS to complete pt's TTS assessment. Clinician provided pt's name, MRN, location, age, room number and provider's name. Secure message completed.    Iris coordinator to update secure chat when assessment time and provider are assigned.  Jackson JONETTA Broach, MS, [facility], [facility] Triage Specialist [phone]

## 2024-02-03 NOTE — ED Provider Notes (Signed)
 Patient was requesting discharge. I had a long conversation with the patient without any family numbers present.  She admits to missing a recent methadone  dose which she feels might have contributed to her symptoms. She is overall feeling improved she is awake and alert and answering questions appropriately.  She is not on oxygen and is mentating appropriately  Patient adamantly denies any suicidal thoughts. She has no plan to harm herself.  She reports her family is worried about her and would like to get her into rehab, patient feels that she needs to make that decision.  At this point, patient is safe for discharge but I encouraged her to follow-up as an outpatient.  Outpatient resources have been given including behavioral health urgent care information.  Patient is also interested in transitioning from methadone  to Suboxone at her MAT clinic   Midge Golas, MD 02/03/24 [phone]

## 2024-02-03 NOTE — ED Notes (Signed)
 Pt was alert and oriented upon discharge, able to ambulate safely and denying any SI/HI.

## 2024-02-03 NOTE — ED Notes (Signed)
 MD at bedside.

## 2024-02-03 NOTE — ED Notes (Signed)
 Pt sister back in room when pt was discharged and wanting pt to stay as mother got a courthouse document. When asked what this entailed sister was unsure but there was a sheriff on the way. Sister also unaware why sheriff is on the way. Pt and sister were educated that pt was safe to discharge and denies any SI/HI, Aox4 and discharged by a provider after the provider evaluated pt. Pt left with all belongings.

## 2024-02-04 ENCOUNTER — Ambulatory Visit (HOSPITAL_COMMUNITY): Admission: EM | Admit: 2024-02-04 | Discharge: 2024-02-05 | Disposition: A | Payer: MEDICAID

## 2024-02-04 DIAGNOSIS — F1910 Other psychoactive substance abuse, uncomplicated: Secondary | ICD-10-CM

## 2024-02-04 DIAGNOSIS — F1510 Other stimulant abuse, uncomplicated: Secondary | ICD-10-CM | POA: Diagnosis not present

## 2024-02-04 DIAGNOSIS — F1190 Opioid use, unspecified, uncomplicated: Secondary | ICD-10-CM

## 2024-02-04 DIAGNOSIS — F1590 Other stimulant use, unspecified, uncomplicated: Secondary | ICD-10-CM

## 2024-02-04 DIAGNOSIS — F1020 Alcohol dependence, uncomplicated: Secondary | ICD-10-CM | POA: Insufficient documentation

## 2024-02-04 DIAGNOSIS — E876 Hypokalemia: Secondary | ICD-10-CM | POA: Insufficient documentation

## 2024-02-04 DIAGNOSIS — F29 Unspecified psychosis not due to a substance or known physiological condition: Secondary | ICD-10-CM | POA: Diagnosis not present

## 2024-02-04 LAB — TSH: TSH: 0.825 u[IU]/mL (ref 0.350–4.500)

## 2024-02-04 LAB — CBC WITH DIFFERENTIAL/PLATELET
Abs Immature Granulocytes: 0.01 K/uL (ref 0.00–0.07)
Basophils Absolute: 0 K/uL (ref 0.0–0.1)
Basophils Relative: 1 %
Eosinophils Absolute: 0 K/uL (ref 0.0–0.5)
Eosinophils Relative: 1 %
HCT: 37 % (ref 36.0–46.0)
Hemoglobin: 12.4 g/dL (ref 12.0–15.0)
Immature Granulocytes: 0 %
Lymphocytes Relative: 35 %
Lymphs Abs: 1.8 K/uL (ref 0.7–4.0)
MCH: 30.9 pg (ref 26.0–34.0)
MCHC: 33.5 g/dL (ref 30.0–36.0)
MCV: 92.3 fL (ref 80.0–100.0)
Monocytes Absolute: 0.3 K/uL (ref 0.1–1.0)
Monocytes Relative: 6 %
Neutro Abs: 2.9 K/uL (ref 1.7–7.7)
Neutrophils Relative %: 57 %
Platelets: 280 K/uL (ref 150–400)
RBC: 4.01 MIL/uL (ref 3.87–5.11)
RDW: 13.2 % (ref 11.5–15.5)
WBC: 5.1 K/uL (ref 4.0–10.5)
nRBC: 0 % (ref 0.0–0.2)

## 2024-02-04 LAB — COMPREHENSIVE METABOLIC PANEL WITH GFR
ALT: 20 U/L (ref 0–44)
AST: 18 U/L (ref 15–41)
Albumin: 3.6 g/dL (ref 3.5–5.0)
Alkaline Phosphatase: 45 U/L (ref 38–126)
Anion gap: 13 (ref 5–15)
BUN: 5 mg/dL — ABNORMAL LOW (ref 6–20)
CO2: 22 mmol/L (ref 22–32)
Calcium: 9.1 mg/dL (ref 8.9–10.3)
Chloride: 103 mmol/L (ref 98–111)
Creatinine, Ser: 0.81 mg/dL (ref 0.44–1.00)
GFR, Estimated: >60 mL/min (ref >60)
Glucose, Bld: 112 mg/dL — ABNORMAL HIGH (ref 70–99)
Potassium: 3.3 mmol/L — ABNORMAL LOW (ref 3.5–5.1)
Sodium: 138 mmol/L (ref 135–145)
Total Bilirubin: 0.4 mg/dL (ref 0.0–1.2)
Total Protein: 7.1 g/dL (ref 6.5–8.1)

## 2024-02-04 LAB — LIPID PANEL
Cholesterol: 131 mg/dL (ref 0–200)
HDL: 41 mg/dL (ref >40)
LDL Cholesterol: 82 mg/dL (ref 0–99)
Total CHOL/HDL Ratio: 3.2 ratio
Triglycerides: 41 mg/dL (ref <150)
VLDL: 8 mg/dL (ref 0–40)

## 2024-02-04 LAB — MAGNESIUM: Magnesium: 1.8 mg/dL (ref 1.7–2.4)

## 2024-02-04 LAB — ETHANOL: Alcohol, Ethyl (B): <15 mg/dL (ref <15)

## 2024-02-04 MED ORDER — ACETAMINOPHEN 325 MG PO TABS
650.0000 mg | ORAL_TABLET | Freq: Four times a day (QID) | ORAL | Status: DC | PRN
  Filled 2024-02-04: qty 2

## 2024-02-04 MED ORDER — ALUM & MAG HYDROXIDE-SIMETH 200-200-20 MG/5ML PO SUSP: 30.0000 mL | ORAL | Status: DC | PRN

## 2024-02-04 MED ORDER — HALOPERIDOL LACTATE 5 MG/ML IJ SOLN: 5.0000 mg | Freq: Three times a day (TID) | INTRAMUSCULAR | Status: DC | PRN

## 2024-02-04 MED ORDER — OLANZAPINE 2.5 MG PO TABS
2.5000 mg | ORAL_TABLET | Freq: Once | ORAL | Status: DC
  Filled 2024-02-04: qty 1

## 2024-02-04 MED ORDER — MAGNESIUM HYDROXIDE 400 MG/5ML PO SUSP: 30.0000 mL | Freq: Every day | ORAL | Status: DC | PRN

## 2024-02-04 MED ORDER — HALOPERIDOL LACTATE 5 MG/ML IJ SOLN: 10.0000 mg | Freq: Three times a day (TID) | INTRAMUSCULAR | Status: DC | PRN

## 2024-02-04 MED ORDER — DIPHENHYDRAMINE HCL 50 MG/ML IJ SOLN: 50.0000 mg | Freq: Three times a day (TID) | INTRAMUSCULAR | Status: DC | PRN

## 2024-02-04 MED ORDER — METHADONE HCL 10 MG/ML PO CONC: 120.0000 mg | Freq: Every day | ORAL | Status: DC

## 2024-02-04 MED ORDER — TRAZODONE HCL 100 MG PO TABS: 100.0000 mg | ORAL_TABLET | Freq: Every evening | ORAL | Status: DC | PRN

## 2024-02-04 MED ORDER — HALOPERIDOL 5 MG PO TABS: 5.0000 mg | ORAL_TABLET | Freq: Three times a day (TID) | ORAL | Status: DC | PRN

## 2024-02-04 MED ORDER — NICOTINE 14 MG/24HR TD PT24
14.0000 mg | MEDICATED_PATCH | Freq: Once | TRANSDERMAL | Status: DC
  Administered 2024-02-04: 14 mg via TRANSDERMAL
  Filled 2024-02-04: qty 1

## 2024-02-04 MED ORDER — LORAZEPAM 2 MG/ML IJ SOLN: 2.0000 mg | Freq: Three times a day (TID) | INTRAMUSCULAR | Status: DC | PRN

## 2024-02-04 MED ORDER — NICOTINE POLACRILEX 2 MG MT GUM
2.0000 mg | CHEWING_GUM | Freq: Once | OROMUCOSAL | Status: AC
  Administered 2024-02-04: 2 mg via ORAL
  Filled 2024-02-04: qty 1

## 2024-02-04 MED ORDER — DIPHENHYDRAMINE HCL 50 MG PO CAPS: 50.0000 mg | ORAL_CAPSULE | Freq: Three times a day (TID) | ORAL | Status: DC | PRN

## 2024-02-04 NOTE — Progress Notes (Addendum)
   02/04/24 1400  [facility] Triage Screening (Walk-ins at [facility] only)  What Is the Reason for Your Visit/Call Today? Pt presents to [facility] via [employer] under IVC. Pt was uncooperative with security at arrival and securing of belongings. Pt is disorganized and unsure about her surroundings, UTA. Pt denied every question and when asked what do you feel would help you most today pt responded see myself when i walk out of the doors. Pt denies SI, HI, AVH, Alcohol and Drug use. Per IVC: [name] states she wants to kill herself while putter needles in her arm. She is a susbtance abuser of meth. Takes Methadone  and uses street drugs.  How Long Has This Been Causing You Problems?  (UTA)  Have You Recently Had Any Thoughts About Hurting Yourself? No  Are You Planning to Commit Suicide/Harm Yourself At This time? No  Have you Recently Had Thoughts About Hurting Someone [name]? No  Are You Planning To Harm Someone At This Time? No  Are you currently experiencing any auditory, visual or other hallucinations? No  Have You Used Any Alcohol or Drugs in the Past 24 Hours? No  What Do You Feel Would Help You the Most Today? Medication(s);Treatment for Depression or other mood problem;Alcohol or Drug Use Treatment (When asked this question pt responded see myself when i walk out of the doors)  Determination of Need Urgent (48 hours)  Options For Referral Medication Management;Intensive Outpatient Therapy;Inpatient Hospitalization;Outpatient Therapy;Partial Hospitalization;BH Urgent Care;Facility-Based Crisis  Determination of Need filed? Yes

## 2024-02-04 NOTE — ED Notes (Signed)
 Pt is disorganized and requires frequent redirection. She continue to try to leave area. Environmental check complete. Will continue to monitor for safety

## 2024-02-04 NOTE — ED Notes (Signed)
 Patient escorted to adult side of Flex. Patient not able to answer admission questions at the time. In hallway obs area patient was not able to follow commands to change into scrubs. Staff not able to get patient to do EKG or draw labs. On another assessment in the hallway patient was AOX4, then became disorganized seconds after. Received approval of primary care NP and Morna RAMAN. For patient to be in flex with clothes. Currently in flex with her sweater and pijama pants. Pijama pants do have strings, will continue to encourage her to change into hospital scrubs when patient appears more redirectable. Calm and laying down in recliner/bed at this time. Safety measure initiated to the best of our ability.

## 2024-02-04 NOTE — BH Assessment (Signed)
 Comprehensive Clinical Assessment (CCA) Note  02/04/2024 Amber [name] [id number]  Disposition: Per Amber Rams NP, patient will be observed and monitored in continuous assessment.   The patient demonstrates the following risk factors for suicide: Chronic risk factors for suicide include: substance use disorder. Acute risk factors for suicide include: loss of father.. Protective factors for this patient include: positive social support and responsibility to others (children, family). Considering these factors, the overall suicide risk at this point appears to be low. Patient is appropriate for outpatient follow up.  Per triage note:  Pt presents to [facility] via [employer] under IVC. Pt was uncooperative with security at arrival and securing of belongings. Pt is disorganized and unsure about her surroundings, UTA. Pt denied every question and when asked what do you feel would help you most today pt responded see myself when i walk out of the doors. Pt denies SI, HI, AVH, Alcohol and Drug use. Per IVC: [name] states she wants to kill herself while putter needles in her arm. She is a susbtance abuser of meth. Takes Methadone  and uses street drugs.  Patient is a 33 year old female who presents to [facility] involuntarily brought in by [employer].  Per the IVC petition the respondent states she wants to kill herself while putting needles in her arm.  She has a substance abuser of meth takes methadone  and uses street drugs.   The patient no known past psychiatric history although patient reports she has been diagnosed with anxiety.  Patient denies past substance use history although patient's mother reports that she does go to methadone  clinic at [address] of [place].  Collateral was obtained by patient's mother Amber [name] who reports that patient goes in and out of her mind.  Upon initial evaluation of patient she is sitting in a chair in assessment room calmly.  Patient states that she was not sure as to why  she was here today other than her mother wanted her to come.  Patient stated I just want to walk out of the doors.  Patient denies any SI HI or AVH.  She also denies any nonsuicidal self injures behaviors.  Patient acknowledged she was at the hospital on Friday due to altered mental status.  However after some time patient had cleared and was able to be discharged.  Her EHR states that just before she was discharged her boyfriend showed up and gave her a dose of methadone ..  Patient's mother reports that earlier today she walked again on the patient who was injecting her arm with an unknown substance.  Patient endorses poor sleep patterns recently and that she has had trouble falling asleep and staying asleep and she has not had a good night sleep in several days.  Patient reports paranoia which has been going on on for years she also endorses low energy and poor concentration.  During today's assessment patient is anxious and keeps bouncing her leg and wringing her hands.  Patient's keeps repeating I am just ready to go I am just waiting for somebody to come and get me so I can go through those buildings.  Patient continues to deny any illicit substance or drug use.  During assessment patient is casually dressed, alert and oriented to person and place. Patient's mood was euthymic with tearful and anxious affect.  Patient's thought processes were disorganized.  Her thought content was scattered with some paranoia.  There is no indication that the patient is currently responding to internal stimuli or experiencing delusional thought content.  Patient was cooperative throughout assessment   Chief Complaint:  Chief Complaint  Patient presents with   IVC   Visit Diagnosis: Psychosis, unspecified type    CCA Screening, Triage and Referral (STR)  Patient Reported Information How did you hear about us ? Legal System What Is the Reason for Your Visit/Call Today? Pt presents to [facility] via [employer] under IVC.  Pt was uncooperative with security at arrival and securing of belongings. Pt is disorganized and unsure about her surroundings, UTA. Pt denied every question and when asked what do you feel would help you most today pt responded see myself when i walk out of the doors. Pt denies SI, HI, AVH, Alcohol and Drug use. Per IVC: [name] states she wants to kill herself while putter needles in her arm. She is a susbtance abuser of meth. Takes Methadone  and uses street drugs.  How Long Has This Been Causing You Problems? -- (UTA)  What Do You Feel Would Help You the Most Today? Medication(s); Treatment for Depression or other mood problem; Alcohol or Drug Use Treatment (When asked this question pt responded see myself when i walk out of the doors)   Have You Recently Had Any Thoughts About Hurting Yourself? No  Are You Planning to Commit Suicide/Harm Yourself At This time? No   Flowsheet Row ED from 02/04/2024 in [facility] ED from 02/02/2024 in [facility] Emergency Department [facility] ED to Hosp-Admission (Discharged) from 09/30/2022 in [facility]  C-SSRS RISK CATEGORY No Risk No Risk No Risk    Have you Recently Had Thoughts About Hurting Someone [name]? No  Are You Planning to Harm Someone at This Time? No  Explanation: N/A   Have You Used Any Alcohol or Drugs in the Past 24 Hours? -- (Pt denies but mother reports she walked in on Patient with needle in her arm)  How Long Ago Did You Use Drugs or Alcohol? Pt. denies What Did You Use and How Much? Pt denies  Do You Currently Have a Therapist/Psychiatrist? Yes  Name of Therapist/Psychiatrist: Name of Therapist/Psychiatrist: [name] @ [facility] as reproted by patient. Mother is unsure   Have You Been Recently Discharged From Any Office Practice or Programs? No  Explanation of Discharge From Practice/Program: N/A   CCA Screening Triage Referral Assessment Type of  Contact: Face-to-Face  Telemedicine Service Delivery:   Is this Initial or Reassessment?   Date Telepsych consult ordered in [facility]:    Time Telepsych consult ordered in [facility]:    Location of Assessment: [facility]  Provider Location: [facility]   Collateral Involvement: Patient's mother Amber Bigger)   Does Patient Have a Automotive Engineer Guardian? No  Legal Guardian Contact Information: N/A  Copy of Legal Guardianship Form: -- (N/A)  Legal Guardian Notified of Arrival: -- (N/A)  Legal Guardian Notified of Pending Discharge: -- (N/A)  If Minor and Not Living with Parent(s), Who has Custody? N/A  Is CPS involved or ever been involved? Never  Is APS involved or ever been involved? Never   Patient Determined To Be At Risk for Harm To Self or Others Based on Review of Patient Reported Information or Presenting Complaint? No  Method: No Plan  Availability of Means: No access or NA  Intent: Vague intent or NA  Notification Required: No need or identified person  Additional Information for Danger to Others Potential: -- (N/A)  Additional Comments for Danger to Others Potential: N/A  Are There Guns or Other Weapons in Your Home? No  Types of Guns/Weapons: Denies weapons in the home  Are These Weapons Safely Secured?                            -- (pt denies)  Who Could Verify You Are Able To Have These Secured: Pt's mother confirmed there are no gun in the home  Do You Have any Outstanding Charges, Pending Court Dates, Parole/Probation? Pt denies  Contacted To Inform of Risk of Harm To Self or Others: -- (N/A)    Does Patient Present under Involuntary Commitment? Yes    [place] of Residence: [place]   Patient Currently Receiving the Following Services: Individual Therapy   Determination of Need: Urgent (48 hours)   Options For Referral: Medication Management; Intensive Outpatient Therapy; Inpatient  Hospitalization; Outpatient Therapy; Partial Hospitalization; [facility] Care; Facility-Based Crisis     CCA Biopsychosocial Patient Reported Schizophrenia/Schizoaffective Diagnosis in Past: No   Strengths: UTA   Mental Health Symptoms Depression:  None   Duration of Depressive symptoms:    Mania:  None   Anxiety:   Difficulty concentrating; Restlessness; Worrying   Psychosis:  Grossly disorganized or catatonic behavior   Duration of Psychotic symptoms: Duration of Psychotic Symptoms: Less than six months   Trauma:  None   Obsessions:  None   Compulsions:  None   Inattention:  None   Hyperactivity/Impulsivity:  None   Oppositional/Defiant Behaviors:  None   Emotional Irregularity:  None   Other Mood/Personality Symptoms:  N/A    Mental Status Exam Appearance and self-care  Stature:  Average   Weight:  Average weight   Clothing:  Casual   Grooming:  Normal   Cosmetic use:  None   Posture/gait:  Normal   Motor activity:  Restless   Sensorium  Attention:  Confused   Concentration:  Variable   Orientation:  Person; Place; Situation   Recall/memory:  Defective in Immediate   Affect and Mood  Affect:  Congruent   Mood:  Anxious   Relating  Eye contact:  Fleeting   Facial expression:  Responsive   Attitude toward examiner:  Cooperative   Thought and Language  Speech flow: Clear and Coherent   Thought content:  -- (Disorganized)   Preoccupation:  None   Hallucinations:  None   Organization:  Disorganized   Company Secretary of Knowledge:  Fair   Intelligence:  Average  Abstraction:  Abstract   Judgement:  Impaired  Reality Testing:  Distorted   Insight:  Flashes of insight   Decision Making:  Impulsive  Social Functioning  Social Maturity:  Impulsive   Social Judgement:  Heedless   Stress  Stressors:  Grief/losses; Housing; Relationship   Coping Ability:  Human Resources Officer Deficits:  Communication    Supports:  Family     Religion: Religion/Spirituality Are You A Religious Person?: Yes What is Your Religious Affiliation?: Christian How Might This Affect Treatment?: N/A  Leisure/Recreation: Leisure / Recreation Do You Have Hobbies?: Yes Leisure and Hobbies: Listening to music, reading  Exercise/Diet: Exercise/Diet Do You Exercise?: Yes What Type of Exercise Do You Do?: Other (Comment) (Pt did specify wha types of excercies she does, only that she xcrcises several times a week.) How Many Times a Week Do You Exercise?: 4-5 times a week Have You Gained or Lost A Significant Amount of Weight in the Past Six Months?: No Do You  Follow a Special Diet?: No Do You Have Any Trouble Sleeping?: Yes Explanation of Sleeping Difficulties: Has not slept much recently oonly a few hours at a time   CCA Employment/Education Employment/Work Situation: Employment / Work Environmental Consultant Job has Been Impacted by Current Illness: No Has Patient ever Been in Equities Trader?: No  Education: Education Is Patient Currently Attending School?: No Last Grade Completed: 12 Did You Product Manager?: Yes What Type of College Degree Do you Have?: an associate from [employer] Did You Have An Individualized Education Program (IIEP): No Did You Have Any Difficulty At School?: No Patient's Education Has Been Impacted by Current Illness: No   CCA Family/Childhood History Family and Relationship History: Family history Does patient have children?: Yes How many children?: 1 How is patient's relationship with their children?: good relationship  Childhood History:  Childhood History By whom was/is the patient raised?: Mother, Father Did patient suffer any verbal/emotional/physical/sexual abuse as a child?: No Did patient suffer from severe childhood neglect?: No Has patient ever been sexually abused/assaulted/raped as an adolescent or adult?: No Was the patient ever a victim of a  crime or a disaster?: No Witnessed domestic violence?: No Has patient been affected by domestic violence as an adult?: No       CCA Substance Use Alcohol/Drug Use: Alcohol / Drug Use Pain Medications: See MAR Prescriptions: See MAR Over the Counter: See MAR Longest period of sobriety (when/how long): unknown Negative Consequences of Use: Personal relationships Withdrawal Symptoms: Other (Comment) (UTA) Substance #1 Name of Substance 1: Opiates 1 - Age of First Use: unknown 1 - Amount (size/oz): unknown 1 - Frequency: daily 1 - Duration: ongoing 1 - Last Use / Amount: unknown 1 - Method of Aquiring: UTA 1- Route of Use: unknown Substance #2 Name of Substance 2: Amphetamine 2 - Age of First Use: unknown 2 - Amount (size/oz): unknown 2 - Frequency: daily 2 - Duration: ongoing 2 - Last Use / Amount: unknown 2 - Method of Aquiring: unknown 2 - Route of Substance Use: unknonw Per pt's mother she will do well for a while then start back using                     ASAM's:  Six Dimensions of Multidimensional Assessment  Dimension 1:  Acute Intoxication and/or Withdrawal Potential:   Dimension 1:  Description of individual's past and current experiences of substance use and withdrawal: Pt hs stopped  using off  and on in the past.  Dimension 2:  Biomedical Conditions and Complications:   Dimension 2:  Description of patient's biomedical conditions and  complications: none reported  Dimension 3:  Emotional, Behavioral, or Cognitive Conditions and Complications:  Dimension 3:  Description of emotional, behavioral, or cognitive conditions and complications: pt reports diagnsis of anxiety  Dimension 4:  Readiness to Change:  Dimension 4:  Description of Readiness to Change criteria: patient is ambievalent in her desire to change.  Dimension 5:  Relapse, Continued use, or Continued Problem Potential:  Dimension 5:  Relapse, continued use, or continued problem potential critiera  description: pt is in denial of any substance use although she tested posive on yesterday.  Dimension 6:  Recovery/Living Environment:  Dimension 6:  Recovery/Iiving environment criteria description: Pt is currently living with her mother, hving to move out from current residence.  ASAM Severity Score: ASAM's Severity Rating Score: 7  ASAM Recommended Level of Treatment: ASAM Recommended Level of Treatment: Level II Intensive Outpatient Treatment  Substance use Disorder (SUD) Substance Use Disorder (SUD)  Checklist Symptoms of Substance Use: Continued use despite having a persistent/recurrent physical/psychological problem caused/exacerbated by use, Persistent desire or unsuccessful efforts to cut down or control use  Recommendations for Services/Supports/Treatments: Recommendations for Services/Supports/Treatments Recommendations For Services/Supports/Treatments: SAIOP (Substance Abuse Intensive Outpatient Program), Individual Therapy  Disposition Recommendation per psychiatric provider: We recommend inpatient psychiatric hospitalization after medical hospitalization. Patient has been involuntarily committed on 02/04/2024.    DSM5 Diagnoses: Patient Active Problem List   Diagnosis Date Noted   Pneumonia due to infectious organism 10/01/2022   Hypokalemia 10/01/2022   Acute hypoxic respiratory failure (HCC) 09/30/2022   Nexplanon  in place 12/16/2021   Encounter for well woman exam with routine gynecological exam 12/16/2021   Umbilical hernia without obstruction and without gangrene 12/16/2021   Acute left-sided weakness 12/31/2019   Mania (HCC) 12/31/2019   Encounter for Nexplanon  removal 07/20/2018   Pyelonephritis 05/11/2013   Renal abscess 05/11/2013   Tobacco use disorder 05/11/2013   Sepsis (HCC) 05/11/2013   IC (interstitial cystitis) 04/17/2012     Referrals to Alternative Service(s): Referred to Alternative Service(s):   Place:   Date:   Time:    Referred to Alternative  Service(s):   Place:   Date:   Time:    Referred to Alternative Service(s):   Place:   Date:   Time:    Referred to Alternative Service(s):   Place:   Date:   Time:     Lianne JINNY Shuck, LCSW

## 2024-02-04 NOTE — ED Provider Notes (Addendum)
 [facility] Continuous Assessment Admission H&P  Date: 02/04/24 Patient Name: Amber [name] MRN: [id number] Chief Complaint: IVC  Diagnoses:  Final diagnoses:  Psychosis, unspecified psychosis type (HCC)  Polysubstance abuse (HCC)  Opioid use disorder  Methamphetamine use    HPI:  Amber [name] 33 y.o., female with no known past psychiatric history; past substance use history of Polysubstance abuse to include OUD treated with Methadone  by [facility], methamphetamine, and tobacco use; past medical history of seizure disorder and migraine headaches. She presents to [facility] in the custody of law enforcement under IVC taken out by her mother. Per IVC [name] states she wants to kill herself while putting needles in her arm. She is a susbtance abuser of meth. Takes Methadone  and uses street drugs. Mother reports patient has been intermittently altered for ~1 week stating patient goes in and out of her mind.   During evaluation Amber [name] is initially standing in the hallway and requires significant persuasion to come into the interview room for vital signs and evaluation. She reports poor sleep and states she hasn't slept in days. She endorses difficulty falling asleep and says she feels like people are watching her. She reports this paranoia has been intermittent and ongoing for years throughout her adult life. She adds we sleep when we sleep and we go when we go. She endorses low energy and poor concentration.   She is tearful and anxious appearing during interview bouncing her leg and wringing her hands. Patient denies illicit drug use. Endorses taking methadone  and when I ask why she takes Methadone  she responds I don't know. When I ask where she gets the Methadone  from she tells me [facility] and adds I put it down and people pick it up. Patient reports she lives with her 54 year old daughter and does not currently work.   She denies suicidal ideation, homicidal  ideation; auditory, visual, or tactile hallucinations. She endorses paranoia. She is alert and oriented to person and place only and is able to intermittently follow commands. At times she displays non-sensical speech and paranoid behavior. Her thought process is disorganized with loosing of associations and scattered thought content. She has impaired judgement and is lacking insight.  Of note, patient was recently in the ED on 02/02/24 with very similar presentation. She presented at ~2000 with altered mental status AOx2, not making sense when talking, and headache. She was worked up and ultimately discharged from the ED ~4 hrs later after clearing up and was alert, oriented, and speaking coherently at time of discharge 02/03/24 ~0044. Also, of note once ED physician shared plan to discharge patient with the family they then reported that patient was suicidal which patient herself denied to ED physician.  At time of admission to OBS today patient is pleasantly uncooperative with lab draw and EKG. Labs and EKG from 02/02/24 reviewed in EPIC and notable for mild hypokalemia and mild hypomagnesemia, both of which were repleted at that time. Serum hCG negative. UDS positive for opiates and amphetamine. 02/02/24 EKG reviewed and independently interpreted by me: NSR no conduction abnormalities, HR 94, no qtC prolongation.   Collateral Obtained from Mother Amber [name] [phone] in [facility] family room. Mother reports she has been trying to get patient help all week long because patient goes in and out of her mind. Initially patient refused to seek help but family was able to convince her to go to the ED because she was not feeling well. Mom reports  while patient was in the ED mom went to take IVC papers out on patient however, patient was released from the ED before she could be served. Mom also reports yesterday she drove patient to the [facility] but patient did not want to come in and patient jumped out of moms  car and went and got in the car with someone else before mom was able to have patient served at the [facility]. Mom reports patient has a history of opioid use disorder and is on methadone  and had been clean and doing well for ~3 years until patient started seeing current boyfriend. Mom reports since then she has noted a change in her daughter and believed she was back on drugs. When I asked mom if patient was suicidal mom replied yes. When I asked mom to tell me what patient has said or done to make her believe this mom states she keeps doing the drugs. I further inquired about the IVC paperwork where it says patient states she wants to kill herself while putting needles in her arm and when that happened. Mother reports this did not happen and patient did not say she wants to kill herself but she does continue to put needles in her arm. I asked mom if she considered the patients drug use as the patient wanting to die and mother replied yes, it's going to kill her. I further clarified if patient has made any statements regarding harming herself, any gestures to indicate she wanted to harm herself, or if patient had any history of attempting to harm herself and mom replied no.   Total Time spent with patient: 1 hour  Musculoskeletal  Strength & Muscle Tone: within normal limits Gait & Station: normal Patient leans: N/A  Psychiatric Specialty Exam  Presentation General Appearance: Disheveled  Eye Contact:Good  Speech:Normal Rate  Speech Volume:Normal  Handedness:No data recorded  Mood and Affect  Mood:Euthymic  Affect:Tearful (Anxious appearing and Paranoid)   Thought Process  Thought Processes:Disorganized  Descriptions of Associations:Loose  Orientation:Partial  Thought Content:Scattered; Paranoid Ideation    Hallucinations:Hallucinations: None  Ideas of Reference:None  Suicidal Thoughts:Suicidal Thoughts: No  Homicidal Thoughts:Homicidal Thoughts: No   Sensorium   Memory:Immediate Poor; Recent Poor; Remote Poor  Judgment:Impaired  Insight:Lacking   Executive Functions  Concentration:Poor  Attention Span:Poor  Recall:Poor  Fund of Knowledge:Poor  Language:Fair   Psychomotor Activity  Psychomotor Activity:Psychomotor Activity: Increased   Assets  Assets:Physical Health; Social Support   Sleep  Sleep:Sleep: Poor Number of Hours of Sleep: 3   Nutritional Assessment (For OBS and FBC admissions only) Has the patient had a weight loss or gain of 10 pounds or more in the last 3 months?: No Has the patient had a decrease in food intake/or appetite?: No Does the patient have dental problems?: No Does the patient have eating habits or behaviors that may be indicators of an eating disorder including binging or inducing vomiting?: No Has the patient recently lost weight without trying?: 0 Has the patient been eating poorly because of a decreased appetite?: 0 Malnutrition Screening Tool Score: 0    Physical Exam Vitals and nursing note reviewed.  Constitutional:      General: She is not in acute distress. HENT:     Head: Normocephalic.  Cardiovascular:     Rate and Rhythm: Normal rate.  Pulmonary:     Effort: Pulmonary effort is normal.  Musculoskeletal:        General: Normal range of motion.  Skin:    General:  Skin is warm and dry.     Capillary Refill: Capillary refill takes less than 2 seconds.     Comments: Scattered puncture marks noted on hands and forearms consistent with needle injection   Neurological:     Mental Status: She is confused.     Motor: No weakness.  Psychiatric:        Mood and Affect: Mood is anxious.        Behavior: Behavior is uncooperative.        Thought Content: Thought content is paranoid. Thought content does not include homicidal or suicidal ideation.        Cognition and Memory: Cognition is impaired. Memory is impaired. She exhibits impaired recent memory and impaired remote memory.         Judgment: Judgment is inappropriate.    Review of Systems  Constitutional:  Negative for chills and fever.  HENT:  Negative for congestion.   Respiratory:  Negative for cough.   Cardiovascular:  Negative for chest pain and palpitations.  Neurological:  Positive for headaches. Negative for weakness.  Psychiatric/Behavioral:  Positive for substance abuse. Negative for suicidal ideas. The patient is nervous/anxious and has insomnia.   All other systems reviewed and are negative.   Blood pressure 120/84, pulse 85, temperature 98.4 F (36.9 C), temperature source Oral, resp. rate 16, SpO2 100%. There is no height or weight on file to calculate BMI.  Past Psychiatric History: Polysubstance Abuse   Is the patient at risk to self? No  Has the patient been a risk to self in the past 6 months? No .    Has the patient been a risk to self within the distant past? No   Is the patient a risk to others? No   Has the patient been a risk to others in the past 6 months? No   Has the patient been a risk to others within the distant past? No   Past Medical History: Seizure Disorder   Social History: Substance Use Disorder: Methamphetamine, Opiate, Tobacco  Last Labs:  Admission on 02/02/2024, Discharged on 02/03/2024  Component Date Value Ref Range Status   WBC 02/02/2024 6.1  4.0 - 10.5 K/uL Final   RBC 02/02/2024 3.97  3.87 - 5.11 MIL/uL Final   Hemoglobin 02/02/2024 12.2  12.0 - 15.0 g/dL Final   HCT 88/71/7974 36.1  36.0 - 46.0 % Final   MCV 02/02/2024 90.9  80.0 - 100.0 fL Final   MCH 02/02/2024 30.7  26.0 - 34.0 pg Final   MCHC 02/02/2024 33.8  30.0 - 36.0 g/dL Final   RDW 88/71/7974 13.2  11.5 - 15.5 % Final   Platelets 02/02/2024 266  150 - 400 K/uL Final   nRBC 02/02/2024 0.0  0.0 - 0.2 % Final   Neutrophils Relative % 02/02/2024 76  % Final   Neutro Abs 02/02/2024 4.6  1.7 - 7.7 K/uL Final   Lymphocytes Relative 02/02/2024 20  % Final   Lymphs Abs 02/02/2024 1.2  0.7 - 4.0 K/uL  Final   Monocytes Relative 02/02/2024 4  % Final   Monocytes Absolute 02/02/2024 0.3  0.1 - 1.0 K/uL Final   Eosinophils Relative 02/02/2024 0  % Final   Eosinophils Absolute 02/02/2024 0.0  0.0 - 0.5 K/uL Final   Basophils Relative 02/02/2024 0  % Final   Basophils Absolute 02/02/2024 0.0  0.0 - 0.1 K/uL Final   Immature Granulocytes 02/02/2024 0  % Final   Abs Immature Granulocytes 02/02/2024 0.01  0.00 - 0.07 K/uL Final   Performed at [facility], [address], [place], [place] [address]   Sodium 02/02/2024 137  135 - 145 mmol/L Final   Potassium 02/02/2024 3.4 (L)  3.5 - 5.1 mmol/L Final   Chloride 02/02/2024 105  98 - 111 mmol/L Final   CO2 02/02/2024 24  22 - 32 mmol/L Final   Glucose, Bld 02/02/2024 106 (H)  70 - 99 mg/dL Final   Glucose reference range applies only to samples taken after fasting for at least 8 hours.   BUN 02/02/2024 5 (L)  6 - 20 mg/dL Final   Creatinine, Ser 02/02/2024 0.79  0.44 - 1.00 mg/dL Final   Calcium 88/71/7974 9.1  8.9 - 10.3 mg/dL Final   Total Protein 88/71/7974 7.4  6.5 - 8.1 g/dL Final   Albumin 88/71/7974 3.8  3.5 - 5.0 g/dL Final   AST 88/71/7974 21  15 - 41 U/L Final   ALT 02/02/2024 24  0 - 44 U/L Final   Alkaline Phosphatase 02/02/2024 49  38 - 126 U/L Final   Total Bilirubin 02/02/2024 0.2  0.0 - 1.2 mg/dL Final   GFR, Estimated 02/02/2024 >60  >60 mL/min Final   Comment: (NOTE) Calculated using the CKD-EPI Creatinine Equation (2021)    Anion gap 02/02/2024 8  5 - 15 Final   Performed at [facility], [address] [place], [place] [address]   Magnesium  02/02/2024 1.6 (L)  1.7 - 2.4 mg/dL Final   Performed at [facility], [address] [place], [place] [address]   Troponin I (High Sensitivity) 02/02/2024 <2  <18 ng/L Final   Comment: (NOTE) Elevated high sensitivity troponin I (hsTnI) values and significant  changes across serial measurements may suggest ACS but many other  chronic and acute conditions are  known to elevate hsTnI results.  Refer to the Links section for chest pain algorithms and additional  guidance. Performed at [facility], [address], [place], [place] [address]    Preg, Serum 02/02/2024 NEGATIVE  NEGATIVE Final   Comment:        THE SENSITIVITY OF THIS METHODOLOGY IS >10 mIU/mL. Performed at [facility], [address] [place], [place] [address]    Opiates 02/02/2024 POSITIVE (A)  NONE DETECTED Final   Cocaine 02/02/2024 NONE DETECTED  NONE DETECTED Final   Benzodiazepines 02/02/2024 NONE DETECTED  NONE DETECTED Final   Amphetamines 02/02/2024 POSITIVE (A)  NONE DETECTED Final   Comment: (NOTE) Trazodone is metabolized in vivo to several metabolites, including pharmacologically active m-CPP, which is excreted in the urine. Immunoassay screens for amphetamines and MDMA have potential cross-reactivity with these compounds and may provide false positive  results.     Tetrahydrocannabinol 02/02/2024 NONE DETECTED  NONE DETECTED Final   Barbiturates 02/02/2024 NONE DETECTED  NONE DETECTED Final   Comment: (NOTE) DRUG SCREEN FOR MEDICAL PURPOSES ONLY.  IF CONFIRMATION IS NEEDED FOR ANY PURPOSE, NOTIFY LAB WITHIN 5 DAYS.  LOWEST DETECTABLE LIMITS FOR URINE DRUG SCREEN Drug Class                     Cutoff (ng/mL) Amphetamine and metabolites    1000 Barbiturate and metabolites    200 Benzodiazepine                 200 Opiates and metabolites        300 Cocaine and metabolites  300 THC                            50 Performed at [facility], [address], [place], [place] [address]    Color, Urine 02/02/2024 YELLOW  YELLOW Final   APPearance 02/02/2024 CLOUDY (A)  CLEAR Final   Specific Gravity, Urine 02/02/2024 1.010  1.005 - 1.030 Final   pH 02/02/2024 7.0  5.0 - 8.0 Final   Glucose, UA 02/02/2024 NEGATIVE  NEGATIVE mg/dL Final   Hgb urine dipstick 02/02/2024 TRACE (A)  NEGATIVE Final   Bilirubin Urine 02/02/2024 NEGATIVE   NEGATIVE Final   Ketones, ur 02/02/2024 NEGATIVE  NEGATIVE mg/dL Final   Protein, ur 88/71/7974 NEGATIVE  NEGATIVE mg/dL Final   Nitrite 88/71/7974 NEGATIVE  NEGATIVE Final   Leukocytes,Ua 02/02/2024 NEGATIVE  NEGATIVE Final   Performed at [facility], [address], [place], [place] [address]   Lipase 02/02/2024 21  11 - 51 U/L Final   Performed at [facility], [address] [place], [place] [address]   SARS Coronavirus 2 by RT PCR 02/02/2024 NEGATIVE  NEGATIVE Final   Influenza A by PCR 02/02/2024 NEGATIVE  NEGATIVE Final   Influenza B by PCR 02/02/2024 NEGATIVE  NEGATIVE Final   Comment: (NOTE) The Xpert Xpress SARS-CoV-2/FLU/RSV plus assay is intended as an aid in the diagnosis of influenza from Nasopharyngeal swab specimens and should not be used as a sole basis for treatment. Nasal washings and aspirates are unacceptable for Xpert Xpress SARS-CoV-2/FLU/RSV testing.  Fact Sheet for Patients: bloggercourse.com  Fact Sheet for Healthcare Providers: seriousbroker.it  This test is not yet approved or cleared by the [place]  FDA and has been authorized for detection and/or diagnosis of SARS-CoV-2 by FDA under an Emergency Use Authorization (EUA). This EUA will remain in effect (meaning this test can be used) for the duration of the COVID-19 declaration under Section 564(b)(1) of the Act, 21 U.S.C. section 360bbb-3(b)(1), unless the authorization is terminated or revoked.     Resp Syncytial Virus by PCR 02/02/2024 NEGATIVE  NEGATIVE Final   Comment: (NOTE) Fact Sheet for Patients: bloggercourse.com  Fact Sheet for Healthcare Providers: seriousbroker.it  This test is not yet approved or cleared by the [place]  FDA and has been authorized for detection and/or diagnosis of SARS-CoV-2 by FDA under an Emergency Use Authorization (EUA). This EUA will  remain in effect (meaning this test can be used) for the duration of the COVID-19 declaration under Section 564(b)(1) of the Act, 21 U.S.C. section 360bbb-3(b)(1), unless the authorization is terminated or revoked.  Performed at [facility], [address], [place], [place] [address]    RBC / HPF 02/02/2024 0-5  0 - 5 RBC/hpf Final   WBC, UA 02/02/2024 0-5  0 - 5 WBC/hpf Final   Bacteria, UA 02/02/2024 MANY (A)  NONE SEEN Final   Squamous Epithelial / HPF 02/02/2024 0-5  0 - 5 /HPF Final   Mucus 02/02/2024 PRESENT   Final   Performed at [facility], [address], [place], [place] [address]    Allergies: Biaxin [clarithromycin]  Medications:  Facility Ordered Medications  Medication   acetaminophen  (TYLENOL ) tablet 650 mg   alum & mag hydroxide-simeth (MAALOX/MYLANTA) 200-200-20 MG/5ML suspension 30 mL   magnesium  hydroxide (MILK OF MAGNESIA) suspension 30 mL   haloperidol  (HALDOL ) tablet 5 mg   And   diphenhydrAMINE  (BENADRYL ) capsule 50  mg   haloperidol  lactate (HALDOL ) injection 5 mg   And   diphenhydrAMINE  (BENADRYL ) injection 50 mg   And   LORazepam  (ATIVAN ) injection 2 mg   haloperidol  lactate (HALDOL ) injection 10 mg   And   diphenhydrAMINE  (BENADRYL ) injection 50 mg   And   LORazepam  (ATIVAN ) injection 2 mg   traZODone (DESYREL) tablet 100 mg   [COMPLETED] nicotine polacrilex (NICORETTE) gum 2 mg   nicotine (NICODERM CQ - dosed in mg/24 hours) patch 14 mg   [START ON 02/05/2024] methadone  (DOLOPHINE ) 10 MG/ML solution 120 mg   PTA Medications  Medication Sig   methadone  (DOLOPHINE ) 10 MG/ML solution Take 120 mg by mouth daily.   acetaminophen  (TYLENOL ) 500 MG tablet Take 1,000 mg by mouth 2 (two) times daily as needed for moderate pain, fever or headache.   ibuprofen  (ADVIL ) 200 MG tablet Take 800 mg by mouth 2 (two) times daily as needed for headache or moderate pain.   divalproex  (DEPAKOTE  ER) 500 MG 24 hr tablet Take 1 tablet (500 mg total) by  mouth daily.   albuterol  (PROVENTIL ) (2.5 MG/3ML) 0.083% nebulizer solution Take 3 mLs (2.5 mg total) by nebulization every 6 (six) hours as needed for wheezing or shortness of breath.     Medical Decision Making  THURMA PRIEGO will be admitted to [facility] behavioral health continuous observation.   Update: After discussion with attending physician, will uphold IVC and recommend inpatient psychiatric admission. [facility] AC updated.   Laboratory studies ordered including CBC, CMP, ethanol,  magnesium , lipid panel, A1C,  and TSH.  Urinalysis, urine drug screen, and urine pregnancy orders placed. EKG ordered.   #Psychosis Most likely substance induced - Recent ED visit with very similar presentation and patient cleared up in ~4 hours. Monitor for now. - Consider Zyprexa tomorrow if needed  #Opiate Use Disorder Receives Methadone  from [facility]. They are currently closed and I'm unable to confirm dosage.  - Methadone  120 mg (dosage ordered during 09/2023 medical hospitalization) until able to confirm with [facility] tomorrow  #Tobacco Use Disorder - Nicotine Replacement Therapy with Nicotine patch and gum   Agitation Protocol ordered as part of standard [facility] OBS admission  Current PRN medications: -Acetaminophen  650 mg every 6hrs as needed/mild pain -Maalox 30 mL oral every 4 as needed/indigestion -Hydroxyzine 25 mg 3 times daily as needed/anxiety -Magnesium  hydroxide 30 mL daily as needed/mild constipation -Trazodone 100 mg nightly as needed/sleep   Recommendations  Based on my evaluation the patient does not appear to have an emergency medical condition. Patient meets criteria for inpatient psychiatric admission.  This visit was coded based on time. I spent a total of 60 minutes in both face-to-face and non-face-to-face activities for this visit on the date of this encounter.   This document was prepared using Dragon voice recognition software and may include  unintentional dictation errors.  [name] PMHNP-BC, FNP-BC  Psychiatric Mental Health Nurse Practitioner [facility]

## 2024-02-05 ENCOUNTER — Other Ambulatory Visit: Payer: Self-pay

## 2024-02-05 ENCOUNTER — Inpatient Hospital Stay
Admit: 2024-02-05 | Discharge: 2024-02-13 | DRG: 885 | Disposition: A | Payer: MEDICAID | Source: Intra-hospital | Attending: Psychiatry | Admitting: Psychiatry

## 2024-02-05 ENCOUNTER — Encounter: Payer: Self-pay | Admitting: Internal Medicine

## 2024-02-05 DIAGNOSIS — F1910 Other psychoactive substance abuse, uncomplicated: Secondary | ICD-10-CM | POA: Diagnosis present

## 2024-02-05 DIAGNOSIS — F29 Unspecified psychosis not due to a substance or known physiological condition: Principal | ICD-10-CM | POA: Diagnosis present

## 2024-02-05 LAB — HEMOGLOBIN A1C
Hgb A1c MFr Bld: 5.3 % (ref 4.8–5.6)
Mean Plasma Glucose: 105 mg/dL

## 2024-02-05 MED ORDER — HALOPERIDOL LACTATE 5 MG/ML IJ SOLN: 5.0000 mg | Freq: Three times a day (TID) | INTRAMUSCULAR | Status: DC | PRN

## 2024-02-05 MED ORDER — METHADONE HCL 10 MG PO TABS
120.0000 mg | ORAL_TABLET | Freq: Every day | ORAL | Status: DC
  Administered 2024-02-05: 120 mg via ORAL
  Filled 2024-02-05: qty 12

## 2024-02-05 MED ORDER — ONDANSETRON 4 MG PO TBDP
4.0000 mg | ORAL_TABLET | Freq: Three times a day (TID) | ORAL | Status: DC | PRN
  Administered 2024-02-05 – 2024-02-13 (×11): 4 mg via ORAL
  Filled 2024-02-05 (×13): qty 1

## 2024-02-05 MED ORDER — HALOPERIDOL LACTATE 5 MG/ML IJ SOLN: 10.0000 mg | Freq: Three times a day (TID) | INTRAMUSCULAR | Status: DC | PRN

## 2024-02-05 MED ORDER — MAGNESIUM HYDROXIDE 400 MG/5ML PO SUSP: 30.0000 mL | Freq: Every day | ORAL | Status: DC | PRN

## 2024-02-05 MED ORDER — TRAZODONE HCL 50 MG PO TABS
50.0000 mg | ORAL_TABLET | Freq: Every evening | ORAL | Status: DC | PRN
  Administered 2024-02-05 – 2024-02-09 (×2): 50 mg via ORAL
  Filled 2024-02-05 (×4): qty 1

## 2024-02-05 MED ORDER — ALUM & MAG HYDROXIDE-SIMETH 200-200-20 MG/5ML PO SUSP
30.0000 mL | ORAL | Status: DC | PRN
  Administered 2024-02-09: 30 mL via ORAL
  Filled 2024-02-05: qty 30

## 2024-02-05 MED ORDER — HYDROXYZINE HCL 25 MG PO TABS
25.0000 mg | ORAL_TABLET | Freq: Three times a day (TID) | ORAL | Status: DC | PRN
  Filled 2024-02-05 (×2): qty 1

## 2024-02-05 MED ORDER — NICOTINE 14 MG/24HR TD PT24
14.0000 mg | MEDICATED_PATCH | Freq: Every day | TRANSDERMAL | Status: DC
  Administered 2024-02-07 – 2024-02-11 (×3): 14 mg via TRANSDERMAL
  Filled 2024-02-05 (×4): qty 1

## 2024-02-05 MED ORDER — LORAZEPAM 2 MG/ML IJ SOLN: 2.0000 mg | Freq: Three times a day (TID) | INTRAMUSCULAR | Status: DC | PRN

## 2024-02-05 MED ORDER — ACETAMINOPHEN 325 MG PO TABS
650.0000 mg | ORAL_TABLET | Freq: Four times a day (QID) | ORAL | Status: DC | PRN
  Administered 2024-02-05 – 2024-02-12 (×7): 650 mg via ORAL
  Filled 2024-02-05 (×8): qty 2

## 2024-02-05 MED ORDER — DIPHENHYDRAMINE HCL 50 MG/ML IJ SOLN: 50.0000 mg | Freq: Three times a day (TID) | INTRAMUSCULAR | Status: DC | PRN

## 2024-02-05 MED ORDER — HALOPERIDOL 5 MG PO TABS
5.0000 mg | ORAL_TABLET | Freq: Three times a day (TID) | ORAL | Status: DC | PRN
  Administered 2024-02-05: 5 mg via ORAL
  Filled 2024-02-05: qty 1

## 2024-02-05 MED ORDER — DIPHENHYDRAMINE HCL 25 MG PO CAPS
50.0000 mg | ORAL_CAPSULE | Freq: Three times a day (TID) | ORAL | Status: DC | PRN
  Administered 2024-02-05: 50 mg via ORAL
  Filled 2024-02-05: qty 2

## 2024-02-05 NOTE — Progress Notes (Signed)
 Patient ID: Amber [name], female   DOB: 1990-11-20, 33 y.o.   MRN: [id number]  Patient admitted via IVC from [facility] for SI reported by the patient's mother. Upon admission patient denies SI/HI/AVH. Patient disorganized and reports poor sleep related to unknown situations, people, and places. Patient states, I feel ok, just nervous. Patient oriented to unit rules and procedures. Safety checks initiated. Patient remains safe at this time.

## 2024-02-05 NOTE — Tx Team (Signed)
 Initial Treatment Plan 02/05/2024 12:08 PM Amber [name] [id number]    PATIENT STRESSORS: Substance abuse     PATIENT STRENGTHS: Supportive family/friends    PATIENT IDENTIFIED PROBLEMS: Coping skills  Long term stability                   DISCHARGE CRITERIA:  Improved stabilization in mood, thinking, and/or behavior  PRELIMINARY DISCHARGE PLAN: Outpatient therapy  PATIENT/FAMILY INVOLVEMENT: This treatment plan has been presented to and reviewed with the patient, Amber [name].  The patient has been given the opportunity to ask questions and make suggestions.  Eleanor JAYSON Metro, RN 02/05/2024, 12:08 PM

## 2024-02-05 NOTE — ED Notes (Signed)
 Pt observed/assessed in recliner sleeping. RR even and unlabored, appearing in no noted distress. Environmental check complete, will continue to monitor for safety

## 2024-02-05 NOTE — ED Notes (Signed)
 [employer] notified of need for transport to BMU

## 2024-02-05 NOTE — Discharge Summary (Signed)
 Amber [name] transferred  per NP order. Discussed with the patient and all questions fully answered. An EMTALA  and Med Necessity forms were printed and given to the receiving nurse.all belongings returned. Patient escorted out and transferred via [facility].[name]  02/05/2024 9:31 AM

## 2024-02-05 NOTE — Group Note (Signed)
 Recreation Therapy Group Note   Group Topic:Other  Group Date: 02/05/2024 Start Time: 1500 End Time: 1545 Facilitators: Celestia Jeoffrey FORBES ARTICE, CTRS Location: Craft Room  Activity Description/Intervention: Therapeutic Drumming. Patients with peers and staff were given the opportunity to engage in a leader facilitated [employer]TH[facility] Group Empowerment Drumming Circle with staff from the [employer], in partnership with [employer]. Teaching laboratory technician and trained [employer], Norleen Mon leading with LRT observing and documenting intervention and pt response. This evidenced-based practice targets 7 areas of health and wellbeing in the human experience including: stress-reduction, exercise, self-expression, camaraderie/support, nurturing, spirituality, and music-making (leisure).    Goal Area(s) Addresses:  Patient will engage in pro-social way in music group.  Patient will follow directions of drum leader on the first prompt. Patient will demonstrate no behavioral issues during group.  Patient will identify if a reduction in stress level occurs as a result of participation in therapeutic drum circle.   Affect/Mood: N/A   Participation Level: Did not attend    Clinical Observations/Individualized Feedback: Patient did not attend group.   Plan: Continue to engage patient in RT group sessions 2-3x/week.   [address], LRT, CTRS 02/05/2024 5:25 PM

## 2024-02-05 NOTE — Progress Notes (Signed)
 Pt is awake, alert and oriented with mild confusion. Pt complained of generalized body ache. No signs of acute distress noted. Administered scheduled med per order. Pt denies current SI/HI/AVH, plan or intent. Staff will monitor for pt's safety.

## 2024-02-05 NOTE — Group Note (Signed)
 [facility] LCSW Group Therapy Note    Group Date: 02/05/2024 Start Time: 1300 End Time: 1400  Type of Therapy and Topic:  Group Therapy:  Overcoming Obstacles  Participation Level:  [facility] PARTICIPATION LEVEL: Did Not Attend  Description of Group:   In this group patients will be encouraged to explore what they see as obstacles to their own wellness and recovery. They will be guided to discuss their thoughts, feelings, and behaviors related to these obstacles. The group will process together ways to cope with barriers, with attention given to specific choices patients can make. Each patient will be challenged to identify changes they are motivated to make in order to overcome their obstacles. This group will be process-oriented, with patients participating in exploration of their own experiences as well as giving and receiving support and challenge from other group members.  Therapeutic Goals: 1. Patient will identify personal and current obstacles as they relate to admission. 2. Patient will identify barriers that currently interfere with their wellness or overcoming obstacles.  3. Patient will identify feelings, thought process and behaviors related to these barriers. 4. Patient will identify two changes they are willing to make to overcome these obstacles:    Summary of Patient Progress Patient did not attend group.  Therapeutic Modalities:   Cognitive Behavioral Therapy Solution Focused Therapy Motivational Interviewing Relapse Prevention Therapy   Nadara JONELLE Fam, LCSW

## 2024-02-05 NOTE — Progress Notes (Signed)
 Pt appeared confused on the unit some during the night, pt had to be re-directed multiple times back to her room. Pt given PRN confusion protocol per MAR. Pt endorsed that she was anxious much of the evening during the assessment , writer spoke with pt about non pharmacological things to help with anxiety.   02/05/24 2300  Psych Admission Type (Psych Patients Only)  Admission Status Involuntary  Psychosocial Assessment  Patient Complaints Anxiety;Nervousness  Eye Contact Fair  Facial Expression Flat;Blank  Affect Anxious;Preoccupied  Speech Slow  Interaction Assertive  Motor Activity Slow  Appearance/Hygiene Improved  Behavior Characteristics Anxious;Restless  Mood Suspicious;Preoccupied  Aggressive Behavior  Effect No apparent injury  Thought Process  Coherency Disorganized  Content Preoccupation  Delusions None reported or observed  Perception WDL  Hallucination None reported or observed  Judgment Impaired  Confusion Mild  Danger to Self  Current suicidal ideation? Denies  Danger to Others  Danger to Others None reported or observed

## 2024-02-05 NOTE — Progress Notes (Signed)
 Pt constantly coming out her room appearing confused needing redirection multiple times

## 2024-02-05 NOTE — Group Note (Signed)
 Date:  02/05/2024 Time:  10:28 AM  Group Topic/Focus:  Managing Feelings:   The focus of this group is to identify what feelings patients have difficulty handling and develop a plan to handle them in a healthier way upon discharge. Personal Choices and Values:   The focus of this group is to help patients assess and explore the importance of values in their lives, how their values affect their decisions, how they express their values and what opposes their expression. Self Care:   The focus of this group is to help patients understand the importance of self-care in order to improve or restore emotional, physical, spiritual, interpersonal, and financial health.    Participation Level:  Did Not Attend  Participation Quality:  Pt Did Not Attend  Affect:  Pt Did Not Attend  Cognitive:  Pt Did Not Attend  Insight: Pt Did Not Attend  Engagement in Group:  Pt Did Not Attend  Modes of Intervention:  Pt Did Not Attend  Additional Comments:  Pt Did Not Attend  Amber [name] E Amber [name] 02/05/2024, 10:28 AM

## 2024-02-05 NOTE — Plan of Care (Signed)
   Problem: Education: Goal: Emotional status will improve Outcome: Not Progressing Goal: Mental status will improve Outcome: Not Progressing Goal: Verbalization of understanding the information provided will improve Outcome: Not Progressing

## 2024-02-05 NOTE — Progress Notes (Signed)
 Report called to [place], RN [facility] BMU.

## 2024-02-05 NOTE — Group Note (Signed)
 Recreation Therapy Group Note   Group Topic:Healthy Support Systems  Group Date: 02/05/2024 Start Time: 1010 End Time: 1055 Facilitators: Celestia Jeoffrey BRAVO, LRT, CTRS Location: Craft Room  Group Description: Straw Bridge. In groups or individually, patients were given 10 plastic drinking straws and an equal length of masking tape. Using the materials provided, patients were instructed to build a free-standing bridge-like structure to suspend an everyday item (ex: deck of cards) off the floor or table surface. All materials were required to be used in secondary school teacher. LRT facilitated post-activity discussion reviewing the importance of having strong and healthy support systems in our lives. LRT discussed how the people in our lives serve as the tape and the deck of cards we placed on top of our straw structure are the stressors we face in daily life. LRT and pts discussed what happens in our life when things get too heavy for us , and we don't have strong supports outside of the hospital. Pt shared 2 of their healthy supports in their life aloud in the group.   Goal Area(s) Addressed:  Patient will identify 2 healthy supports in their life. Patient will identify skills to successfully complete activity. Patient will identify correlation of this activity to life post-discharge.  Patient will build on frustration tolerance skills. Patient will increase team building and communication skills.   Affect/Mood: N/A   Participation Level: Did not attend    Clinical Observations/Individualized Feedback: Patient did not attend group due to being a new admission.   Plan: Continue to engage patient in RT group sessions 2-3x/week.   Jeoffrey BRAVO Celestia, LRT, CTRS 02/05/2024 1:09 PM

## 2024-02-06 DIAGNOSIS — F1910 Other psychoactive substance abuse, uncomplicated: Secondary | ICD-10-CM | POA: Diagnosis present

## 2024-02-06 LAB — PREGNANCY, URINE: Preg Test, Ur: NEGATIVE

## 2024-02-06 LAB — URINE DRUG SCREEN
Amphetamines: POSITIVE — AB
Barbiturates: NEGATIVE
Benzodiazepines: NEGATIVE
Cocaine: NEGATIVE
Fentanyl: POSITIVE — AB
Methadone Scn, Ur: POSITIVE — AB
Opiates: NEGATIVE
Tetrahydrocannabinol: NEGATIVE

## 2024-02-06 MED ORDER — METHADONE HCL 10 MG/ML PO CONC
145.0000 mg | Freq: Every day | ORAL | Status: DC
  Administered 2024-02-06 – 2024-02-12 (×7): 145 mg via ORAL
  Filled 2024-02-06 (×7): qty 15

## 2024-02-06 MED ORDER — METHADONE HCL 5 MG PO TABS: 145.0000 mg | ORAL_TABLET | Freq: Every day | ORAL | Status: DC

## 2024-02-06 NOTE — Group Note (Signed)
 Date:  02/06/2024 Time:  5:11 PM  Group Topic/Focus:  Goals Group:   The focus of this group is to help patients establish daily goals to achieve during treatment and discuss how the patient can incorporate goal setting into their daily lives to aide in recovery.    Participation Level:  Did Not Attend  Amber [name] 02/06/2024, 5:11 PM

## 2024-02-06 NOTE — Group Note (Signed)
 Recreation Therapy Group Note   Group Topic:Goal Setting  Group Date: 02/06/2024 Start Time: 1005 End Time: 1055 Facilitators: Celestia Jeoffrey BRAVO, LRT, CTRS Location: Craft Room  Group Description: Product/process Development Scientist. Patients were given many different magazines, a glue stick, markers, and a piece of cardstock paper. LRT and pts discussed the importance of having goals in life. LRT and pts discussed the difference between short-term and long-term goals, as well as what a SMART goal is. LRT encouraged pts to create a vision board, with images they picked and then cut out with safety scissors from the magazine, for themselves, that capture their short and long-term goals. LRT encouraged pts to show and explain their vision board to the group.   Goal Area(s) Addressed:  Patient will gain knowledge of short vs. long term goals.  Patient will identify goals for themselves. Patient will practice setting SMART goals. Patient will verbalize their goals to LRT and peers.   Affect/Mood: N/A   Participation Level: Did not attend    Clinical Observations/Individualized Feedback: Patient did not attend group.   Plan: Continue to engage patient in RT group sessions 2-3x/week.   Jeoffrey BRAVO Celestia, LRT, CTRS 02/06/2024 11:16 AM

## 2024-02-06 NOTE — [facility] Suicide Risk Assessment (Signed)
 [facility] Admission Suicide Risk Assessment   Nursing information obtained from:    Demographic factors:  Caucasian Current Mental Status:  NA Loss Factors:  Loss of significant relationship Historical Factors:  Impulsivity Risk Reduction Factors:  Sense of responsibility to family, Living with another person, especially a relative, Positive social support  Total Time spent with patient: 30 minutes Principal Problem: Psychosis, unspecified psychosis type (HCC) Diagnosis:  Principal Problem:   Psychosis, unspecified psychosis type (HCC)  Subjective Data: This is a 33 year old female who presents under IVC for suicidal ideation as reported by her family.  Psychiatric history is significant for polysubstance abuse.  Patient denies past or current suicidal ideation.  Patient not currently taking psychotropic medication.  Patient states she receives methadone  from [facility].  Patient is admitted to the adult inpatient unit with every 15-minute safety monitoring.  Multidisciplinary team approach is offered.  Medication management, group/milieu therapy is offered.  Continued Clinical Symptoms:  Alcohol Use Disorder Identification Test Final Score (AUDIT): 0 The Alcohol Use Disorders Identification Test, Guidelines for Use in Primary Care, Second Edition.  World Science Writer [facility]). Score between 0-7:  no or low risk or alcohol related problems. Score between 8-15:  moderate risk of alcohol related problems. Score between 16-19:  high risk of alcohol related problems. Score 20 or above:  warrants further diagnostic evaluation for alcohol dependence and treatment.   CLINICAL FACTORS:   Alcohol/Substance Abuse/Dependencies   Musculoskeletal: Strength & Muscle Tone: within normal limits Gait & Station: normal Patient leans: N/A  Psychiatric Specialty Exam:  Presentation  General Appearance:  Disheveled  Eye Contact: Minimal  Speech: Clear and Coherent  Speech  Volume: Decreased  Handedness:No data recorded  Mood and Affect  Mood: Euthymic  Affect: Flat   Thought Process  Thought Processes: Coherent  Descriptions of Associations:Intact  Orientation:Full (Time, Place and Person)  Thought Content:WDL  History of Schizophrenia/Schizoaffective disorder:No  Duration of Psychotic Symptoms:Less than six months  Hallucinations:Hallucinations: None  Ideas of Reference:None  Suicidal Thoughts:Suicidal Thoughts: No  Homicidal Thoughts:Homicidal Thoughts: No   Sensorium  Memory: Immediate Fair; Recent Fair  Judgment: Poor  Insight: Poor   Executive Functions  Concentration: Fair  Attention Span: Fair  Recall: [employer] of Knowledge: Fair  Language: Fair   Psychomotor Activity  Psychomotor Activity: Psychomotor Activity: Normal   Assets  Assets: Manufacturing Systems Engineer; Housing; Social Support   Sleep  Sleep: Sleep: Fair    Physical Exam: Physical Exam ROS Blood pressure 95/66, pulse 86, temperature 98.5 F (36.9 C), temperature source Oral, resp. rate 20, height 5' 5 (1.651 m), weight 70.3 kg, SpO2 99%. Body mass index is 25.79 kg/m.   COGNITIVE FEATURES THAT CONTRIBUTE TO RISK:  None    SUICIDE RISK:   Mild:  Suicidal ideation of limited frequency, intensity, duration, and specificity, and only by family member report.  There are no identifiable plans, no associated intent, mild dysphoria and related symptoms, good self-control (both objective and subjective assessment), few other risk factors, and identifiable protective factors, including available and accessible social support.  PLAN OF CARE: Patient is admitted to the adult inpatient unit with every 15-minute safety monitoring.  Multidisciplinary team approach is offered.  Medication management, group/milieu therapy is offered.  I certify that inpatient services furnished can reasonably be expected to improve the patient's condition.    Camelia LITTIE Lukes, PA-C 02/06/2024, 7:55 PM
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
## 2024-02-06 NOTE — Progress Notes (Signed)
   02/06/24 0853  Psych Admission Type (Psych Patients Only)  Admission Status Involuntary  Psychosocial Assessment  Patient Complaints Anxiety  Eye Contact Brief  Facial Expression Anxious  Affect Anxious  Speech Soft;Slow  Interaction Cautious  Motor Activity Slow  Appearance/Hygiene Unremarkable  Behavior Characteristics Anxious  Mood Anxious  Thought Process  Coherency Disorganized  Content Paranoia  Delusions None reported or observed  Perception WDL  Hallucination None reported or observed  Judgment Impaired  Confusion Mild  Danger to Self  Current suicidal ideation? Denies  Self-Injurious Behavior No self-injurious ideation or behavior indicators observed or expressed   Agreement Not to Harm Self Yes  Description of Agreement Verbal  Danger to Others  Danger to Others None reported or observed

## 2024-02-06 NOTE — Group Note (Signed)
 Recreation Therapy Group Note   Group Topic:General Recreation  Group Date: 02/06/2024 Start Time: 1530 End Time: 1610 Facilitators: Celestia Jeoffrey BRAVO, LRT, CTRS Location: Courtyard  Group Description: [employer]. LRT and patients played games of basketball, drew with chalk, and played corn hole while outside in the courtyard while getting fresh air and sunlight. Music was being played in the background. LRT and peers conversed about different games they have played before, what they do in their free time and anything else that is on their minds. LRT encouraged pts to drink water after being outside, sweating and getting their heart rate up.  Goal Area(s) Addressed: Patient will build on frustration tolerance skills. Patients will partake in a competitive play game with peers. Patients will gain knowledge of new leisure interest/hobby.    Affect/Mood: Appropriate   Participation Level: Minimal    Clinical Observations/Individualized Feedback: Amber [name] was present for the first 10 minutes. Pt asked if they were allowed to smoke cigarettes while outside and once pt received the answer, she went inside. Pt did not return.   Plan: Continue to engage patient in RT group sessions 2-3x/week.   [address], LRT, CTRS 02/06/2024 5:22 PM

## 2024-02-06 NOTE — BHH Suicide Risk Assessment (Signed)
 [facility] INPATIENT:  Family/Significant Other Suicide Prevention Education  Suicide Prevention Education:  Education Completed; [name], mother, [phone] ,  (name of family member/significant other) has been identified by the patient as the family member/significant other with whom the patient will be residing, and identified as the person(s) who will aid the patient in the event of a mental health crisis (suicidal ideations/suicide attempt).  With written consent from the patient, the family member/significant other has been provided the following suicide prevention education, prior to the and/or following the discharge of the patient.  The suicide prevention education provided includes the following: Suicide risk factors Suicide prevention and interventions National Suicide Hotline telephone number [facility] assessment telephone number [facility] Emergency Assistance [facility] and/or Residential Mobile Crisis Unit telephone number  Request made of family/significant other to: Remove weapons (e.g., guns, rifles, knives), all items previously/currently identified as safety concern.   Remove drugs/medications (over-the-counter, prescriptions, illicit drugs), all items previously/currently identified as a safety concern.  The family member/significant other verbalizes understanding of the suicide prevention education information provided.  The family member/significant other agrees to remove the items of safety concern listed above.  Sherryle JINNY Margo 02/06/2024, 3:23 PM

## 2024-02-06 NOTE — Plan of Care (Signed)

## 2024-02-06 NOTE — Group Note (Signed)
 [facility] LCSW Group Therapy Note   Group Date: 02/06/2024 Start Time: 1300 End Time: 1400   Type of Therapy/Topic:  Group Therapy:  Emotion Regulation  Participation Level:  Did Not Attend   Mood:  Description of Group:    The purpose of this group is to assist patients in learning to regulate negative emotions and experience positive emotions. Patients will be guided to discuss ways in which they have been vulnerable to their negative emotions. These vulnerabilities will be juxtaposed with experiences of positive emotions or situations, and patients challenged to use positive emotions to combat negative ones. Special emphasis will be placed on coping with negative emotions in conflict situations, and patients will process healthy conflict resolution skills.  Therapeutic Goals: Patient will identify two positive emotions or experiences to reflect on in order to balance out negative emotions:  Patient will label two or more emotions that they find the most difficult to experience:  Patient will be able to demonstrate positive conflict resolution skills through discussion or role plays:   Summary of Patient Progress:   Did not attend.     Therapeutic Modalities:   Cognitive Behavioral Therapy Feelings Identification Dialectical Behavioral Therapy   Amber CHRISTELLA Kerns, LCSW
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
## 2024-02-06 NOTE — [facility] Suicide Risk Assessment (Signed)
 [facility] INPATIENT:  Family/Significant Other Suicide Prevention Education  Suicide Prevention Education:  Contact Attempts: [name], mother, [phone], (name of family member/significant other) has been identified by the patient as the family member/significant other with whom the patient will be residing, and identified as the person(s) who will aid the patient in the event of a mental health crisis.  With written consent from the patient, two attempts were made to provide suicide prevention education, prior to and/or following the patient's discharge.  We were unsuccessful in providing suicide prevention education.  A suicide education pamphlet was given to the patient to share with family/significant other.  Date and time of first attempt: 02/06/2024 at 1:12PM Date and time of second attempt: Second attempt is needed.   CSW left HIPAA compliant voicemail.   Sherryle JINNY Margo 02/06/2024, 1:11 PM

## 2024-02-06 NOTE — Group Note (Signed)
 Date:  02/06/2024 Time:  9:14 PM    Additional Comments:  Did not attend group.  Amber [name] 02/06/2024, 9:14 PM

## 2024-02-06 NOTE — Plan of Care (Signed)
  Problem: Activity: Goal: Interest or engagement in activities will improve Outcome: Progressing Goal: Sleeping patterns will improve Outcome: Progressing   Problem: Safety: Goal: Periods of time without injury will increase Outcome: Progressing   Problem: Education: Goal: Emotional status will improve Outcome: Not Progressing Goal: Mental status will improve Outcome: Not Progressing   Problem: Coping: Goal: Ability to identify and develop effective coping behavior will improve Outcome: Not Progressing   Problem: Self-Concept: Goal: Ability to identify factors that promote anxiety will improve Outcome: Not Progressing Goal: Level of anxiety will decrease Outcome: Not Progressing Goal: Ability to modify response to factors that promote anxiety will improve Outcome: Not Progressing

## 2024-02-06 NOTE — H&P (Signed)
 Psychiatric Admission Assessment Adult  Patient Identification: Amber [name] MRN:  [id number] Date of Evaluation:  02/06/2024 Chief Complaint:  Psychosis, unspecified psychosis type [facility]) [F29]   History of Present Illness:  Per H&P at [facility] 02/04/2024: Amber [name] 33 y.o., female with no known past psychiatric history; past substance use history of Polysubstance abuse to include OUD treated with Methadone  by [facility], methamphetamine, and tobacco use; past medical history of seizure disorder and migraine headaches. She presents to [facility] in the custody of law enforcement under IVC taken out by her mother. Per IVC [name] states she wants to kill herself while putting needles in her arm. She is a susbtance abuser of meth. Takes Methadone  and uses street drugs. Mother reports patient has been intermittently altered for ~1 week stating patient goes in and out of her mind.    During evaluation Amber [name] is initially standing in the hallway and requires significant persuasion to come into the interview room for vital signs and evaluation. She reports poor sleep and states she hasn't slept in days. She endorses difficulty falling asleep and says she feels like people are watching her. She reports this paranoia has been intermittent and ongoing for years throughout her adult life. She adds we sleep when we sleep and we go when we go. She endorses low energy and poor concentration.    She is tearful and anxious appearing during interview bouncing her leg and wringing her hands. Patient denies illicit drug use. Endorses taking methadone  and when I ask why she takes Methadone  she responds I don't know. When I ask where she gets the Methadone  from she tells me [facility] and adds I put it down and people pick it up. Patient reports she lives with her 80 year old daughter and does not currently work.    She denies suicidal ideation, homicidal ideation; auditory,  visual, or tactile hallucinations. She endorses paranoia. She is alert and oriented to person and place only and is able to intermittently follow commands. At times she displays non-sensical speech and paranoid behavior. Her thought process is disorganized with loosing of associations and scattered thought content. She has impaired judgement and is lacking insight.   Of note, patient was recently in the ED on 02/02/24 with very similar presentation. She presented at ~2000 with altered mental status AOx2, not making sense when talking, and headache. She was worked up and ultimately discharged from the ED ~4 hrs later after clearing up and was alert, oriented, and speaking coherently at time of discharge 02/03/24 ~0044. Also, of note once ED physician shared plan to discharge patient with the family they then reported that patient was suicidal which patient herself denied to ED physician.   At time of admission to OBS today patient is pleasantly uncooperative with lab draw and EKG. Labs and EKG from 02/02/24 reviewed in EP[facility] and notable for mild hypokalemia and mild hypomagnesemia, both of which were repleted at that time. Serum hCG negative. UDS positive for opiates and amphetamine. 02/02/24 EKG reviewed and independently interpreted by me: NSR no conduction abnormalities, HR 94, no qtC prolongation.    Collateral Obtained from Mother Amber [name] [phone] in [facility] family room. Mother reports she has been trying to get patient help all week long because patient goes in and out of her mind. Initially patient refused to seek help but family was able to convince her to go to the ED because she was not feeling well. Mom reports  while patient was in the ED mom went to take IVC papers out on patient however, patient was released from the ED before she could be served. Mom also reports yesterday she drove patient to the [facility] but patient did not want to come in and patient jumped out of moms car and went and  got in the car with someone else before mom was able to have patient served at the [facility]. Mom reports patient has a history of opioid use disorder and is on methadone  and had been clean and doing well for ~3 years until patient started seeing current boyfriend. Mom reports since then she has noted a change in her daughter and believed she was back on drugs. When I asked mom if patient was suicidal mom replied yes. When I asked mom to tell me what patient has said or done to make her believe this mom states she keeps doing the drugs. I further inquired about the IVC paperwork where it says patient states she wants to kill herself while putting needles in her arm and when that happened. Mother reports this did not happen and patient did not say she wants to kill herself but she does continue to put needles in her arm. I asked mom if she considered the patients drug use as the patient wanting to die and mother replied yes, it's going to kill her. I further clarified if patient has made any statements regarding harming herself, any gestures to indicate she wanted to harm herself, or if patient had any history of attempting to harm herself and mom replied no.  On assessment today, patient is noted be minimal on exam, often giving few word answers without further elaboration.  She is alert and oriented x 4.  She is calm and cooperative.  She reports receiving methadone  at [facility] and is able to give current dose, which was confirmed by [facility].  She denies depression currently, endorsing decreased energy but reports good motivation and denies feelings of hopelessness, worthlessness or anhedonia. She reports stable sleep and appetite.  She denies past or current suicidal ideation/plan.  She denies history of suicide attempts or self-harm behaviors.  She denies HI/plan and denies hallucinations.  She denies past or current symptoms of mania. There is no evidence of overt paranoia or delusions. She is not  noted to be responding to internal stimuli. She endorses situational anxiety but denies persistent worries, nervousness, or difficulty relaxing.  She endorses occasional nightmares.  She denies history of trauma.  She denies flashbacks and hypervigilance.  She denies previous psychiatric hospitalizations or psychiatric diagnoses.  She reports seeing a therapist as needed at [facility].   Total Time spent with patient: 1 hour Sleep  Sleep:Sleep: Fair  Past Psychiatric History: Polysubstance abuse Psychiatric History:  Information collected from the patient and chart review.   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 Yes.    Has the patient been a risk to self in the past 6 months? Unknown   Has the patient been a risk to self within the distant past? Unknown  Is the patient a risk to others? No.  Has the patient been a risk to others in the past 6 months? No.  Has the patient been a risk to others within the distant past? No.   Columbia Scale:  Flowsheet Row Admission (Current) from 02/05/2024 in [facility] INPATIENT BEH[facility]ICINE ED from 02/04/2024 in [facility] ED from 02/02/2024 in [facility] Emergency Department at  [facility]  C-SSRS RISK CATEGORY No Risk No Risk No Risk     Past Medical History:  Past Medical History:  Diagnosis Date   Abnormal Pap smear    GBS (group B streptococcus) UTI complicating pregnancy 04/17/2012   IC (interstitial cystitis) 04/17/2012   Interstitial cystitis    Ovarian cyst    Protein in urine    Renal abscess    Seizures (HCC)     Past Surgical History:  Procedure Laterality Date   DILATION AND CURETTAGE OF UTERUS     IR FLUORO GUIDED NEEDLE PLC ASPIRATION/INJECTION LOC  01/03/2020   Family History:  Family History  Problem Relation Age of Onset   Cancer Maternal Uncle    Diabetes Maternal Grandmother    Cancer Maternal Grandmother        colon   Diabetes Paternal Grandmother    Cancer Paternal Grandmother        bone   Other Neg Hx     Social History:  Social History   Substance and Sexual Activity  Alcohol Use No   Comment: occ     Social History   Substance and Sexual Activity  Drug Use No   Types: Benzodiazepines, Marijuana, Oxycodone    Comment: no drug use since pregnancy-  NONE SINCE       Allergies:   Allergies  Allergen Reactions   Biaxin [Clarithromycin] Anaphylaxis   Lab Results:  Results for orders placed or performed during the hospital encounter of 02/05/24 (from the past 48 hours)  Pregnancy, urine     Status: None   Collection Time: 02/05/24  5:22 PM  Result Value Ref Range   Preg Test, Ur NEGATIVE NEGATIVE    Comment:        THE SENSITIVITY OF THIS METHODOLOGY IS >20 mIU/mL. Performed at [facility], [address] [place], [place] [address]   Urine Drug Screen     Status: Abnormal   Collection Time: 02/05/24  5:23 PM  Result Value Ref Range   Opiates NEGATIVE NEGATIVE   Cocaine NEGATIVE NEGATIVE   Benzodiazepines NEGATIVE NEGATIVE   Amphetamines POSITIVE (A) NEGATIVE   Tetrahydrocannabinol NEGATIVE NEGATIVE   Barbiturates NEGATIVE NEGATIVE   Methadone  Scn, Ur POSITIVE (A) NEGATIVE    Fentanyl  POSITIVE (A) NEGATIVE    Comment: (NOTE) Drug screen is for Medical Purposes only. Positive results are preliminary only. If confirmation is needed, notify lab within 5 days.  Drug Class                 Cutoff (ng/mL) Amphetamine and metabolites 1000 Barbiturate and metabolites 200 Benzodiazepine              200 Opiates and metabolites     300 Cocaine and metabolites     300 THC  50 Fentanyl                     5 Methadone                    300  Trazodone is metabolized in vivo to several metabolites,  including pharmacologically active m-CPP, which is excreted in the  urine.  Immunoassay screens for amphetamines and MDMA have potential  cross-reactivity with these compounds and may provide false positive  result.  Performed at [facility], [address] [place], [place] [address]     Blood Alcohol level:  Lab Results  Component Value Date   [facility] <15 02/04/2024    Metabolic Disorder Labs:  Lab Results  Component Value Date   HGBA1C 5.3 02/04/2024   MPG 105 02/04/2024   MPG 111 01/01/2020   No results found for: PROLACTIN Lab Results  Component Value Date   CHOL 131 02/04/2024   TRIG 41 02/04/2024   HDL 41 02/04/2024   CHOLHDL 3.2 02/04/2024   VLDL 8 02/04/2024   LDLCALC 82 02/04/2024   LDLCALC 111 (H) 01/01/2020    Current Medications: Current Facility-Administered Medications  Medication Dose Route Frequency Provider Last Rate Last Admin   acetaminophen  (TYLENOL ) tablet 650 mg  650 mg Oral Q6H PRN Ajibola, Ene A, NP   650 mg at 02/05/24 1611   alum & mag hydroxide-simeth (MAALOX/MYLANTA) 200-200-20 MG/5ML suspension 30 mL  30 mL Oral Q4H PRN Ajibola, Ene A, NP       haloperidol  (HALDOL ) tablet 5 mg  5 mg Oral TID PRN Ajibola, Ene A, NP   5 mg at 02/05/24 2303   And   diphenhydrAMINE  (BENADRYL ) capsule 50 mg  50 mg Oral TID PRN Ajibola, Ene A, NP   50 mg at 02/05/24 2303   haloperidol  lactate (HALDOL ) injection  5 mg  5 mg Intramuscular TID PRN Ajibola, Ene A, NP       And   diphenhydrAMINE  (BENADRYL ) injection 50 mg  50 mg Intramuscular TID PRN Ajibola, Ene A, NP       And   LORazepam  (ATIVAN ) injection 2 mg  2 mg Intramuscular TID PRN Ajibola, Ene A, NP       haloperidol  lactate (HALDOL ) injection 10 mg  10 mg Intramuscular TID PRN Ajibola, Ene A, NP       And   diphenhydrAMINE  (BENADRYL ) injection 50 mg  50 mg Intramuscular TID PRN Ajibola, Ene A, NP       And   LORazepam  (ATIVAN ) injection 2 mg  2 mg Intramuscular TID PRN Ajibola, Ene A, NP       hydrOXYzine (ATARAX) tablet 25 mg  25 mg Oral TID PRN Ajibola, Ene A, NP       magnesium  hydroxide (MILK OF MAGNESIA) suspension 30 mL  30 mL Oral Daily PRN Ajibola, Ene A, NP       methadone  (DOLOPHINE ) 10 MG/ML solution 145 mg  145 mg Oral Daily Jadapalle, Sree, MD   145 mg at 02/06/24 1444   nicotine (NICODERM CQ - dosed in mg/24 hours) patch 14 mg  14 mg Transdermal Daily Jadapalle, Sree, MD       ondansetron  (ZOFRAN -ODT) disintegrating tablet 4 mg  4 mg Oral Q8H PRN Trudy Carwin, NP   4 mg at 02/05/24 2141   traZODone (DESYREL) tablet 50 mg  50 mg Oral QHS PRN Ajibola, Ene A, NP   50 mg at 02/05/24 2141   PTA Medications: Medications  Prior to Admission  Medication Sig Dispense Refill Last Dose/Taking   methadone  (DOLOPHINE ) 10 MG/ML solution Take 145 mg by mouth daily.       Psychiatric Specialty Exam:  Presentation  General Appearance:  Disheveled  Eye Contact: Minimal  Speech: Clear and Coherent  Speech Volume: Decreased    Mood and Affect  Mood: Euthymic  Affect: Flat   Thought Process  Thought Processes: Coherent  Descriptions of Associations:Intact  Orientation:Full (Time, Place and Person)  Thought Content:WDL  Hallucinations:Hallucinations: None  Ideas of Reference:None  Suicidal Thoughts:Suicidal Thoughts: No  Homicidal Thoughts:Homicidal Thoughts: No   Sensorium  Memory: Immediate Fair; Recent  Fair  Judgment: Poor  Insight: Poor   Executive Functions  Concentration: Fair  Attention Span: Fair  Recall: [employer] of Knowledge: Fair  Language: Fair   Psychomotor Activity  Psychomotor Activity: Psychomotor Activity: Normal   Assets  Assets: Manufacturing Systems Engineer; Housing; Social Support    Musculoskeletal: Strength & Muscle Tone: within normal limits Gait & Station: normal  Physical Exam: Physical Exam Vitals and nursing note reviewed.  Constitutional:      Appearance: Normal appearance.  HENT:     Head: Normocephalic and atraumatic.     Nose: Nose normal.     Mouth/Throat:     Mouth: Mucous membranes are moist.  Eyes:     Conjunctiva/sclera: Conjunctivae normal.  Pulmonary:     Effort: Pulmonary effort is normal.  Neurological:     Mental Status: She is alert and oriented to person, place, and time.  Psychiatric:        Attention and Perception: Attention and perception normal.        Mood and Affect: Mood normal. Affect is flat.        Speech: Speech normal.        Behavior: Behavior is actively hallucinating. Behavior is cooperative.        Thought Content: Thought content is not paranoid or delusional. Thought content does not include homicidal or suicidal ideation. Thought content does not include homicidal or suicidal plan.        Cognition and Memory: Cognition normal.        Judgment: Judgment normal.    Review of Systems  Psychiatric/Behavioral:  Negative for depression, hallucinations, substance abuse and suicidal ideas. The patient is not nervous/anxious and does not have insomnia.    Blood pressure 95/66, pulse 86, temperature 98.5 F (36.9 C), temperature source Oral, resp. rate 20, height 5' 5 (1.651 m), weight 70.3 kg, SpO2 99%. Body mass index is 25.79 kg/m.  Principal Diagnosis: Psychosis, unspecified psychosis type (HCC) Diagnosis:  Principal Problem:   Psychosis, unspecified psychosis type (HCC) Active Problems:    Polysubstance abuse (HCC)   Clinical Decision Making:  Treatment Plan Summary:  Safety and Monitoring:             -- Voluntary admission to inpatient psychiatric unit for safety, stabilization and treatment             -- Daily contact with patient to assess and evaluate symptoms and progress in treatment             -- Patient's case to be discussed in multi-disciplinary team meeting             -- Observation Level: q15 minute checks             -- Vital signs:  q12 hours             --  Precautions: suicide, elopement, and assault   2. Psychiatric Diagnoses and Treatment:  Psychosis, unspecified - suspect related to amphetamine use, will continue to monitor Polysubstance abuse  Methadone  continued, dose confirmed with [facility], last dose with [facility] 01/28/24   -- The risks/benefits/side-effects/alternatives to this medication were discussed in detail with the patient and time was given for questions. The patient consents to medication trial.                -- Metabolic profile and EKG monitoring obtained while on an atypical antipsychotic (BMI: Lipid Panel: HbgA1c: QTc:)              -- Encouraged patient to participate in unit milieu and in scheduled group therapies                            3. Medical Issues Being Addressed:   No acute concerns.    4. Discharge Planning:              -- Social work and case management to assist with discharge planning and identification of hospital follow-up needs prior to discharge             -- Estimated LOS: 5-7 days             -- Discharge Concerns: Need to establish a safety plan; Medication compliance and effectiveness             -- Discharge Goals: Return home with outpatient referrals follow ups  Physician Treatment Plan for Primary Diagnosis: Psychosis, unspecified psychosis type (HCC) Long Term Goal(s): Improvement in symptoms so as ready for discharge  Short Term Goals: Ability to identify changes in lifestyle to  reduce recurrence of condition will improve, Ability to verbalize feelings will improve, Ability to identify and develop effective coping behaviors will improve, and Ability to identify triggers associated with substance abuse/mental health issues will improve  Physician Treatment Plan for Secondary Diagnosis: Principal Problem:   Psychosis, unspecified psychosis type (HCC) Active Problems:   Polysubstance abuse (HCC)  Long Term Goal(s): Improvement in symptoms so as ready for discharge  Short Term Goals: Ability to identify changes in lifestyle to reduce recurrence of condition will improve, Ability to verbalize feelings will improve, Ability to identify and develop effective coping behaviors will improve, and Ability to identify triggers associated with substance abuse/mental health issues will improve  I certify that inpatient services furnished can reasonably be expected to improve the patient's condition.    Plan of care discussed with supervising physician, Dr. Jadapalle, who is in agreement with plan.   Camelia LITTIE Lukes, PA-C 12/2/20258:08 PM

## 2024-02-07 NOTE — Progress Notes (Signed)
   02/07/24 2051  Psychosocial Assessment  Patient Complaints Anxiety  Eye Contact Brief  Facial Expression Anxious  Affect Anxious  Speech Slow  Interaction Assertive  Motor Activity Slow  Appearance/Hygiene Disheveled  Behavior Characteristics Anxious  Mood Pleasant;Anxious  Thought Process  Coherency WDL  Content Preoccupation  Delusions None reported or observed  Perception WDL  Hallucination None reported or observed  Judgment Impaired  Confusion WDL  Danger to Self  Current suicidal ideation? Denies  Agreement Not to Harm Self Yes  Description of Agreement verbal  Danger to Others  Danger to Others None reported or observed

## 2024-02-07 NOTE — BH IP Treatment Plan (Signed)
 Interdisciplinary Treatment and Diagnostic Plan Update  02/07/2024 Time of Session: 11:04AM Amber [name] MRN: [id number]  Principal Diagnosis: Psychosis, unspecified psychosis type [facility])  Secondary Diagnoses: Principal Problem:   Psychosis, unspecified psychosis type (HCC) Active Problems:   Polysubstance abuse (HCC)   Current Medications:  Current Facility-Administered Medications  Medication Dose Route Frequency Provider Last Rate Last Admin   acetaminophen  (TYLENOL ) tablet 650 mg  650 mg Oral Q6H PRN Ajibola, Ene A, NP   650 mg at 02/05/24 1611   alum & mag hydroxide-simeth (MAALOX/MYLANTA) 200-200-20 MG/5ML suspension 30 mL  30 mL Oral Q4H PRN Ajibola, Ene A, NP       haloperidol  (HALDOL ) tablet 5 mg  5 mg Oral TID PRN Ajibola, Ene A, NP   5 mg at 02/05/24 2303   And   diphenhydrAMINE  (BENADRYL ) capsule 50 mg  50 mg Oral TID PRN Ajibola, Ene A, NP   50 mg at 02/05/24 2303   haloperidol  lactate (HALDOL ) injection 5 mg  5 mg Intramuscular TID PRN Ajibola, Ene A, NP       And   diphenhydrAMINE  (BENADRYL ) injection 50 mg  50 mg Intramuscular TID PRN Ajibola, Ene A, NP       And   LORazepam  (ATIVAN ) injection 2 mg  2 mg Intramuscular TID PRN Ajibola, Ene A, NP       haloperidol  lactate (HALDOL ) injection 10 mg  10 mg Intramuscular TID PRN Ajibola, Ene A, NP       And   diphenhydrAMINE  (BENADRYL ) injection 50 mg  50 mg Intramuscular TID PRN Ajibola, Ene A, NP       And   LORazepam  (ATIVAN ) injection 2 mg  2 mg Intramuscular TID PRN Ajibola, Ene A, NP       hydrOXYzine (ATARAX) tablet 25 mg  25 mg Oral TID PRN Ajibola, Ene A, NP       magnesium  hydroxide (MILK OF MAGNESIA) suspension 30 mL  30 mL Oral Daily PRN Ajibola, Ene A, NP       methadone  (DOLOPHINE ) 10 MG/ML solution 145 mg  145 mg Oral Daily Jadapalle, Sree, MD   145 mg at 02/07/24 0842   nicotine (NICODERM CQ - dosed in mg/24 hours) patch 14 mg  14 mg Transdermal Daily Jadapalle, Sree, MD   14 mg at 02/07/24 9157    ondansetron  (ZOFRAN -ODT) disintegrating tablet 4 mg  4 mg Oral Q8H PRN Trudy Carwin, NP   4 mg at 02/05/24 2141   traZODone (DESYREL) tablet 50 mg  50 mg Oral QHS PRN Ajibola, Ene A, NP   50 mg at 02/05/24 2141   PTA Medications: Medications Prior to Admission  Medication Sig Dispense Refill Last Dose/Taking   methadone  (DOLOPHINE ) 10 MG/ML solution Take 145 mg by mouth daily.       Patient Stressors: Substance abuse    Patient Strengths: Supportive family/friends   Treatment Modalities: Medication Management, Group therapy, Case management,  1 to 1 session with clinician, Psychoeducation, Recreational therapy.   Physician Treatment Plan for Primary Diagnosis: Psychosis, unspecified psychosis type (HCC) Long Term Goal(s): Improvement in symptoms so as ready for discharge   Short Term Goals: Ability to identify changes in lifestyle to reduce recurrence of condition will improve Ability to verbalize feelings will improve Ability to identify and develop effective coping behaviors will improve Ability to identify triggers associated with substance abuse/mental health issues will improve  Medication Management: Evaluate patient's response, side effects, and tolerance of medication regimen.  Therapeutic Interventions:  1 to 1 sessions, Unit Group sessions and Medication administration.  Evaluation of Outcomes: Progressing  Physician Treatment Plan for Secondary Diagnosis: Principal Problem:   Psychosis, unspecified psychosis type (HCC) Active Problems:   Polysubstance abuse (HCC)  Long Term Goal(s): Improvement in symptoms so as ready for discharge   Short Term Goals: Ability to identify changes in lifestyle to reduce recurrence of condition will improve Ability to verbalize feelings will improve Ability to identify and develop effective coping behaviors will improve Ability to identify triggers associated with substance abuse/mental health issues will improve     Medication  Management: Evaluate patient's response, side effects, and tolerance of medication regimen.  Therapeutic Interventions: 1 to 1 sessions, Unit Group sessions and Medication administration.  Evaluation of Outcomes: Progressing   RN Treatment Plan for Primary Diagnosis: Psychosis, unspecified psychosis type (HCC) Long Term Goal(s): Knowledge of disease and therapeutic regimen to maintain health will improve  Short Term Goals: Ability to demonstrate self-control, Ability to participate in decision making will improve, Ability to verbalize feelings will improve, Ability to disclose and discuss suicidal ideas, Ability to identify and develop effective coping behaviors will improve, and Compliance with prescribed medications will improve  Medication Management: RN will administer medications as ordered by provider, will assess and evaluate patient's response and provide education to patient for prescribed medication. RN will report any adverse and/or side effects to prescribing provider.  Therapeutic Interventions: 1 on 1 counseling sessions, Psychoeducation, Medication administration, Evaluate responses to treatment, Monitor vital signs and CBGs as ordered, Perform/monitor CIWA, COWS, AIMS and Fall Risk screenings as ordered, Perform wound care treatments as ordered.  Evaluation of Outcomes: Progressing   LCSW Treatment Plan for Primary Diagnosis: Psychosis, unspecified psychosis type (HCC) Long Term Goal(s): Safe transition to appropriate next level of care at discharge, Engage patient in therapeutic group addressing interpersonal concerns.  Short Term Goals: Engage patient in aftercare planning with referrals and resources, Increase social support, Increase ability to appropriately verbalize feelings, Increase emotional regulation, Facilitate acceptance of mental health diagnosis and concerns, Facilitate patient progression through stages of change regarding substance use diagnoses and concerns,  Identify triggers associated with mental health/substance abuse issues, and Increase skills for wellness and recovery  Therapeutic Interventions: Assess for all discharge needs, 1 to 1 time with Social worker, Explore available resources and support systems, Assess for adequacy in community support network, Educate family and significant other(s) on suicide prevention, Complete Psychosocial Assessment, Interpersonal group therapy.  Evaluation of Outcomes: Progressing   Progress in Treatment: Attending groups: Yes. Participating in groups: No. Taking medication as prescribed: Yes. Toleration medication: Yes. Family/Significant other contact made: Yes, individual(s) contacted:  SPE completed with the patient's mother.  Patient understands diagnosis: Yes. Discussing patient identified problems/goals with staff: Yes. Medical problems stabilized or resolved: Yes. Denies suicidal/homicidal ideation: Yes. Issues/concerns per patient self-inventory: No. Other: none  New problem(s) identified: No, Describe:  none  New Short Term/Long Term Goal(s): detox, elimination of symptoms of psychosis, medication management for mood stabilization; elimination of SI thoughts; development of comprehensive mental wellness/sobriety plan.   Patient Goals:  I want other people to get through it too  Discharge Plan or Barriers: CSW to assist the patient in development of appropriate dishare plans.   Reason for Continuation of Hospitalization: Anxiety Depression Medication stabilization Suicidal ideation Withdrawal symptoms  Estimated Length of Stay:  1-7 days  Last 3 [place] Suicide Severity Risk Score: Flowsheet Row Admission (Current) from 02/05/2024 in [facility] ED from 02/04/2024 in  [facility] ED from 02/02/2024 in [facility] Emergency Department at [facility]  C-SSRS RISK CATEGORY No Risk No Risk No Risk    Last [facility] 2/9 Scores:     12/16/2021    9:19 AM 07/15/2020    3:09 PM  Depression screen PHQ 2/9  Decreased Interest 0 0  Down, Depressed, Hopeless 0 0  PHQ - 2 Score 0 0  Altered sleeping 1 2  Tired, decreased energy 2 2  Change in appetite 0 0  Feeling bad or failure about yourself  0 0  Trouble concentrating 0 0  Moving slowly or fidgety/restless 0 0  Suicidal thoughts 0 0  PHQ-9 Score 3  4      Data saved with a previous flowsheet row definition    Scribe for Treatment Team: Sherryle JINNY Paola KEN 02/07/2024 3:37 PM

## 2024-02-07 NOTE — Group Note (Signed)
 Date:  02/07/2024 Time:  10:45 PM  Group Topic/Focus:  Wellness Toolbox:   The focus of this group is to discuss various aspects of wellness, balancing those aspects and exploring ways to increase the ability to experience wellness.  Patients will create a wellness toolbox for use upon discharge.    Participation Level:  Active  Participation Quality:  Appropriate and Attentive  Affect:  Appropriate  Cognitive:  Appropriate  Insight: Appropriate and Good  Engagement in Group:  Engaged  Modes of Intervention:  Discussion  Additional Comments:     Kerri Katz 02/07/2024, 10:45 PM

## 2024-02-07 NOTE — Progress Notes (Signed)
   02/07/24 1000  Psych Admission Type (Psych Patients Only)  Admission Status Involuntary  Psychosocial Assessment  Patient Complaints Anxiety (2/10)  Eye Contact Brief  Facial Expression Anxious  Affect Anxious  Speech Soft  Interaction Cautious  Motor Activity Slow  Appearance/Hygiene Unremarkable  Behavior Characteristics Anxious  Mood Anxious;Pleasant  Thought Process  Coherency Disorganized  Content Preoccupation  Delusions None reported or observed  Perception WDL  Hallucination None reported or observed  Judgment Impaired  Confusion Mild  Danger to Self  Current suicidal ideation? Denies

## 2024-02-07 NOTE — Plan of Care (Signed)
   Problem: Education: Goal: Emotional status will improve Outcome: Progressing Goal: Mental status will improve Outcome: Progressing

## 2024-02-07 NOTE — Progress Notes (Signed)
   02/06/24 2300  Psych Admission Type (Psych Patients Only)  Admission Status Involuntary  Psychosocial Assessment  Patient Complaints Anxiety  Eye Contact Brief  Facial Expression Anxious  Affect Anxious  Speech Soft  Interaction Cautious  Motor Activity Slow  Appearance/Hygiene Unremarkable  Behavior Characteristics Anxious  Mood Anxious  Thought Process  Coherency Disorganized  Content Paranoia  Delusions None reported or observed  Perception WDL  Hallucination None reported or observed  Judgment Impaired  Confusion Mild  Danger to Self  Current suicidal ideation? Denies  Danger to Others  Danger to Others None reported or observed

## 2024-02-07 NOTE — Group Note (Deleted)
 Date:  02/07/2024 Time:  11:09 AM  Group Topic/Focus:  Managing Feelings:   The focus of this group is to identify what feelings patients have difficulty handling and develop a plan to handle them in a healthier way upon discharge.     Participation Level:  {BHH PARTICIPATION OZCZO:77735}  Participation Quality:  {BHH PARTICIPATION QUALITY:22265}  Affect:  {BHH AFFECT:22266}  Cognitive:  {BHH COGNITIVE:22267}  Insight: {BHH Insight2:20797}  Engagement in Group:  {BHH ENGAGEMENT IN HMNLE:77731}  Modes of Intervention:  {BHH MODES OF INTERVENTION:22269}  Additional Comments:  ***  Jolan Upchurch L Nicki Furlan 02/07/2024, 11:09 AM

## 2024-02-07 NOTE — Plan of Care (Signed)
   Problem: Education: Goal: Emotional status will improve Outcome: Not Progressing

## 2024-02-07 NOTE — Group Note (Signed)
 Date:  02/07/2024 Time:  11:26 AM  Group Topic/Focus:  Managing Feelings:   The focus of this group is to identify what feelings patients have difficulty handling and develop a plan to handle them in a healthier way upon discharge.    Participation Level:  Active  Participation Quality:  Appropriate  Affect:  Appropriate  Cognitive:  Alert  Insight: Appropriate  Engagement in Group:  Engaged  Modes of Intervention:  Activity, Discussion, and Education  Additional Comments:    Skippy LITTIE Bennett 02/07/2024, 11:26 AM

## 2024-02-07 NOTE — Plan of Care (Signed)
  Problem: Education: Goal: Mental status will improve Outcome: Progressing   

## 2024-02-07 NOTE — Group Note (Signed)
 LCSW Group Therapy Note   Group Date: 02/07/2024 Start Time: 1300 End Time: 1400   Type of Therapy and Topic: Group Therapy: Worry and Anxiety  Participation Level: BHH PARTICIPATION LEVEL: None  Description of Group: In this group, patients will be encouraged to explore their worry around what could happen vs what will happen. Each patient will be challenged to think of personal worries and how they will work their way through that worry around what will happen and what could happen. This group will be process-oriented, with patients participating in exploration of their own experiences as well as giving and receiving support and challenge from other group members.  Therapeutic Goals: Patient will identify personal worries that cause anxiety. Patient will identify clues to identify their worry. Patient will identify ways to handle their worry. Patient will discuss ways that their worry has deceased or why it has not decreased.   Summary of Patient Progress: Patient slept throughout group.    Therapeutic Modalities: Cognitive Behavioral Therapy Solution Focused Therapy Motivational Interviewing  Amber [name], Amber [name] 02/07/2024  2:58 PM

## 2024-02-07 NOTE — BHH Counselor (Signed)
 CSW has sent the requested information to [employer].  CSW received confirmation page that fax was successful.   CSW called to inform mother of this.  Sherryle Margo, MSW, LCSW 02/07/2024 3:51 PM

## 2024-02-08 NOTE — Group Note (Signed)
 Recreation Therapy Group Note   Group Topic:Stress Management  Group Date: 02/08/2024 Start Time: 1530 End Time: 1620 Facilitators: Celestia Jeoffrey FORBES ARTICE, CTRS Location: Craft Room  Group Description: Taboo. LRT and patients played the game Taboo. The object of the game is to have peers guess the word up at the top of the card drawn that is in bold, while being sure to not use any of the descriptive words down below it on the same card. If the person attempting to explain the word in bold uses any of the descriptive words down below, they lose their turn, and no one receives that card or point. LRT and patient's took turns being the one to describe the words while the rest of the group tried to guess what they were describing.   Goal Area(s) Addressed: Patient will identify physical symptoms of stress. Patient will identify emotional symptoms of stress. Patient will identify coping skills for stress. Patient will build frustration tolerance skills.  Patient will increase communication.   Affect/Mood: N/A   Participation Level: Did not attend    Clinical Observations/Individualized Feedback: Patient did not attend group.   Plan: Continue to engage patient in RT group sessions 2-3x/week.   Jeoffrey FORBES Celestia, LRT, CTRS 02/08/2024 5:13 PM

## 2024-02-08 NOTE — Group Note (Signed)
 Date:  02/08/2024 Time:  9:09 PM  Group Topic/Focus:  Self Care:   The focus of this group is to help patients understand the importance of self-care in order to improve or restore emotional, physical, spiritual, interpersonal, and financial health.    Pt did not attend group.  Aslyn Cottman L 02/08/2024, 9:09 PM

## 2024-02-08 NOTE — Group Note (Signed)
 [facility] LCSW Group Therapy Note   Group Date: 02/08/2024 Start Time: 1300 End Time: 1400   Type of Therapy/Topic:  Group Therapy:  Balance in Life  Participation Level:  None   Description of Group:    This group will address the concept of balance and how it feels and looks when one is unbalanced. Patients will be encouraged to process areas in their lives that are out of balance, and identify reasons for remaining unbalanced. Facilitators will guide patients utilizing problem- solving interventions to address and correct the stressor making their life unbalanced. Understanding and applying boundaries will be explored and addressed for obtaining  and maintaining a balanced life. Patients will be encouraged to explore ways to assertively make their unbalanced needs known to significant others in their lives, using other group members and facilitator for support and feedback.  Therapeutic Goals: Patient will identify two or more emotions or situations they have that consume much of in their lives. Patient will identify signs/triggers that life has become out of balance:  Patient will identify two ways to set boundaries in order to achieve balance in their lives:  Patient will demonstrate ability to communicate their needs through discussion and/or role plays  Summary of Patient Progress: Patient was present for part of the group. She participated in the icebreaker but outside of that pt slept through the group.   Therapeutic Modalities:   Cognitive Behavioral Therapy Solution-Focused Therapy Assertiveness Training   Nadara JONELLE Fam, LCSW

## 2024-02-08 NOTE — Progress Notes (Addendum)
 [facility] MD Progress Note  02/07/2024 8:19 AM Amber [name]  MRN:  [id number]   Subjective:  Chart reviewed, case discussed in multidisciplinary meeting, patient seen during rounds.   12/3: On interview today, patient is found resting in bed.  She is calm and cooperative.  She is alert and oriented to person, date, and location.  She is more engaged in interview today.  She was noted to be tearful during treatment team meeting today.  She is tolerating current medication regimen well without adverse effects. Methadone  is continued.  She reports good sleep and fair appetite.  She rates depression as 3 out of 10 and endorses situational anxiety.  She denies SI/HI/plan and denies hallucinations.  Per nursing report, patient has displayed some confusion on the unit.  She did not require any behavioral PRNs overnight.  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  She provides consent to contact mother for collateral.  She does not voice any concerns or complaints at this time.  Past Psychiatric History: see h&P Family History:  Family History  Problem Relation Age of Onset   Cancer Maternal Uncle    Diabetes Maternal Grandmother    Cancer Maternal Grandmother        colon   Diabetes Paternal Grandmother    Cancer Paternal Grandmother        bone   Other Neg Hx    Social History:  Social History   Substance and Sexual Activity  Alcohol Use No   Comment: occ     Social History   Substance and Sexual Activity  Drug Use No   Types: Benzodiazepines, Marijuana, Oxycodone    Comment: no drug use since pregnancy-  NONE SINCE     Social History   Socioeconomic History   Marital status: Single    Spouse name: Not on file   Number of children: 1   Years of education: Not on file   Highest education level: Not on file  Occupational History   Not on file  Tobacco Use   Smoking status: Every Day    Types: E-cigarettes   Smokeless tobacco: Never   Vaping Use   Vaping status: Every Day  Substance and Sexual Activity   Alcohol use: No    Comment: occ   Drug use: No    Types: Benzodiazepines, Marijuana, Oxycodone     Comment: no drug use since pregnancy-  NONE SINCE    Sexual activity: Not Currently    Birth control/protection: Implant  Other Topics Concern   Not on file  Social History Narrative   Right handed   One story home   Drinks caffeine    Social Drivers of Health   Financial Resource Strain: Low Risk  (12/16/2021)   Overall Financial Resource Strain (CARDIA)    Difficulty of Paying Living Expenses: Not hard at all  Food Insecurity: No Food Insecurity (02/05/2024)   Hunger Vital Sign    Worried About Running Out of Food in the Last Year: Never true    Ran Out of Food in the Last Year: Never true  Transportation Needs: No Transportation Needs (02/05/2024)   PRAPARE - Administrator, Civil Service (Medical): No    Lack of Transportation (Non-Medical): No  Physical Activity: Sufficiently Active (12/16/2021)   Exercise Vital Sign    Days of Exercise per Week: 7 days    Minutes of Exercise per Session: 60 min  Stress: No Stress Concern Present (12/16/2021)  [employer] of Occupational Health - Occupational Stress Questionnaire    Feeling of Stress : Only a little  Social Connections: Moderately Isolated (12/16/2021)   Social Connection and Isolation Panel    Frequency of Communication with Friends and Family: More than three times a week    Frequency of Social Gatherings with Friends and Family: Once a week    Attends Religious Services: More than 4 times per year    Active Member of [employer] or Organizations: No    Attends Banker Meetings: Never    Marital Status: Never married   Past Medical History:  Past Medical History:  Diagnosis Date   Abnormal Pap smear    GBS (group B streptococcus) UTI complicating pregnancy 04/17/2012   IC (interstitial cystitis) 04/17/2012   Interstitial  cystitis    Ovarian cyst    Protein in urine    Renal abscess    Seizures (HCC)     Past Surgical History:  Procedure Laterality Date   DILATION AND CURETTAGE OF UTERUS     IR FLUORO GUIDED NEEDLE PLC ASPIRATION/INJECTION LOC  01/03/2020    Current Medications: Current Facility-Administered Medications  Medication Dose Route Frequency Provider Last Rate Last Admin   acetaminophen  (TYLENOL ) tablet 650 mg  650 mg Oral Q6H PRN Ajibola, Ene A, NP   650 mg at 02/07/24 2039   alum & mag hydroxide-simeth (MAALOX/MYLANTA) 200-200-20 MG/5ML suspension 30 mL  30 mL Oral Q4H PRN Ajibola, Ene A, NP       haloperidol  (HALDOL ) tablet 5 mg  5 mg Oral TID PRN Ajibola, Ene A, NP   5 mg at 02/05/24 2303   And   diphenhydrAMINE  (BENADRYL ) capsule 50 mg  50 mg Oral TID PRN Ajibola, Ene A, NP   50 mg at 02/05/24 2303   haloperidol  lactate (HALDOL ) injection 5 mg  5 mg Intramuscular TID PRN Ajibola, Ene A, NP       And   diphenhydrAMINE  (BENADRYL ) injection 50 mg  50 mg Intramuscular TID PRN Ajibola, Ene A, NP       And   LORazepam  (ATIVAN ) injection 2 mg  2 mg Intramuscular TID PRN Ajibola, Ene A, NP       haloperidol  lactate (HALDOL ) injection 10 mg  10 mg Intramuscular TID PRN Ajibola, Ene A, NP       And   diphenhydrAMINE  (BENADRYL ) injection 50 mg  50 mg Intramuscular TID PRN Ajibola, Ene A, NP       And   LORazepam  (ATIVAN ) injection 2 mg  2 mg Intramuscular TID PRN Ajibola, Ene A, NP       hydrOXYzine (ATARAX) tablet 25 mg  25 mg Oral TID PRN Ajibola, Ene A, NP       magnesium  hydroxide (MILK OF MAGNESIA) suspension 30 mL  30 mL Oral Daily PRN Ajibola, Ene A, NP       methadone  (DOLOPHINE ) 10 MG/ML solution 145 mg  145 mg Oral Daily Jadapalle, Sree, MD   145 mg at 02/07/24 0842   nicotine (NICODERM CQ - dosed in mg/24 hours) patch 14 mg  14 mg Transdermal Daily Jadapalle, Sree, MD   14 mg at 02/07/24 9157   ondansetron  (ZOFRAN -ODT) disintegrating tablet 4 mg  4 mg Oral Q8H PRN Trudy Carwin, NP    4 mg at 02/07/24 2039   traZODone (DESYREL) tablet 50 mg  50 mg Oral QHS PRN Ajibola, Ene A, NP   50 mg at 02/05/24 2141    Lab  Results: No results found for this or any previous visit (from the past 48 hours).  Blood Alcohol level:  Lab Results  Component Value Date   [facility] <15 02/04/2024    Metabolic Disorder Labs: Lab Results  Component Value Date   HGBA1C 5.3 02/04/2024   MPG 105 02/04/2024   MPG 111 01/01/2020   No results found for: PROLACTIN Lab Results  Component Value Date   CHOL 131 02/04/2024   TRIG 41 02/04/2024   HDL 41 02/04/2024   CHOLHDL 3.2 02/04/2024   VLDL 8 02/04/2024   LDLCALC 82 02/04/2024   LDLCALC 111 (H) 01/01/2020    Physical Findings: AIMS:  , ,  ,  ,    CIWA:    COWS:      Psychiatric Specialty Exam:  Presentation  General Appearance:  Disheveled  Eye Contact: Fair  Speech: Clear and Coherent  Speech Volume: Decreased    Mood and Affect  Mood: Depressed  Affect: Appropriate    Thought Process  Thought Processes: Coherent  Orientation:Full (Time, Place and Person)  Thought Content:WDL  Hallucinations: None Ideas of Reference:None  Suicidal Thoughts: No Homicidal Thoughts: No  Sensorium  Memory: Immediate Fair; Recent Fair  Judgment: Poor  Insight: Poor   Art Therapist  Concentration: Fair  Attention Span: Fair  Recall: [employer] of Knowledge: Fair  Language: Fair   Psychomotor Activity  Psychomotor Activity: Normal  Musculoskeletal: Strength & Muscle Tone: within normal limits Gait & Station: normal Assets  Assets: Manufacturing Systems Engineer; Housing; Social Support    Physical Exam: Physical Exam ROS Blood pressure (!) 100/58, pulse 78, temperature 97.9 F (36.6 C), temperature source Oral, resp. rate 19, height 5' 5 (1.651 m), weight 70.3 kg, SpO2 98%. Body mass index is 25.79 kg/m.  Diagnosis: Principal Problem:   Psychosis, unspecified psychosis type  (HCC) Active Problems:   Polysubstance abuse (HCC)   PLAN: Safety and Monitoring:  -- Voluntary admission to inpatient psychiatric unit for safety, stabilization and treatment  -- Daily contact with patient to assess and evaluate symptoms and progress in treatment  -- Patient's case to be discussed in multi-disciplinary team meeting  -- Observation Level : q15 minute checks  -- Vital signs:  q12 hours  -- Precautions: suicide, elopement, and assault -- Encouraged patient to participate in unit milieu and in scheduled group therapies  2. Psychiatric Treatment:  Scheduled Medications:  Methadone  continued, dose confirmed with [facility], last dose with [facility] 01/28/24     -- The risks/benefits/side-effects/alternatives to this medication were discussed in detail with the patient and time was given for questions. The patient consents to medication trial.  3. Medical Issues Being Addressed:  No acute concerns.    4. Discharge Planning:   -- Social work and case management to assist with discharge planning and identification of hospital follow-up needs prior to discharge  -- Estimated LOS: 5-7 days  Camelia LITTIE Lukes, PA-C 02/07/2024 8:19 AM

## 2024-02-08 NOTE — BHH Counselor (Signed)
 CSW spoke with the patient on aftercare plans.    Patient is DECLINING referral to [facility] or long-term residential.  She is requesting assistance with outpatient services at this time.  CSW contacted [facility] for scheduling purposes.    Sherryle Margo, MSW, LCSW 02/08/2024 2:47 PM

## 2024-02-08 NOTE — Group Note (Signed)
 Recreation Therapy Group Note   Group Topic:Relaxation  Group Date: 02/08/2024 Start Time: 1000 End Time: 1045 Facilitators: Celestia Jeoffrey BRAVO, LRT, CTRS Location: Craft Room  Group Description: PMR (Progressive Muscle Relaxation). LRT asks patients their current level of stress/anxiety from 1-10, with 10 being the highest. LRT educates patients on what PMR is and the benefits that come from it. Patients are asked to sit with their feet flat on the floor while sitting up and all the way back in their chair, if possible. LRT and pts follow a prompt through a speaker that requires you to tense and release different muscles in their body and focus on their breathing. During session, lights are off and soft music is being played. Pts are given a stress ball to use if needed. At the end of the prompt, LRT asks patients to rank their current levels of stress/anxiety from 1-10, 10 being the highest. LRT provides patients with an education handout on PMR.   Goal Area(s) Addressed:  Patients will be able to describe progressive muscle relaxation.  Patient will practice using relaxation technique. Patient will identify a new coping skill.  Patient will follow multistep directions to reduce anxiety and stress.   Affect/Mood: Appropriate   Participation Level: Active and Engaged   Participation Quality: Independent   Behavior: Appropriate   Speech/Thought Process: Coherent   Insight: Fair   Judgement: Fair    Modes of Intervention: Activity, Education, and Exploration   Patient Response to Interventions:  Receptive   Education Outcome:  In group clarification offered    Clinical Observations/Individualized Feedback: Alfreda was active in their participation of session activities and group discussion. Pt identified that her anxiety and stress were both a 2 before and after the session.    Plan: Continue to engage patient in RT group sessions 2-3x/week.   Jeoffrey BRAVO Celestia, LRT,  CTRS 02/08/2024 12:56 PM

## 2024-02-08 NOTE — Progress Notes (Signed)
 Amber [name], Amber [name], Amber [name], Amber [name], Amber [name], Amber [name] previous notes, Amber appears appropriately engaged in interview [name].  Amber denied current suicidal or homicidal ideations.  Amber denied any auditory or visual hallucinations as well.  Amber did express desire for discharge at this time.  Per nursing staff, Amber continues to have some episodes [name] confusion but appears improved from previous presentation.  Amber declines any further medication management at this time.  Amber did report that she was previously established at [facility] Amber would need social work to help in setting up follow-up appointments.  This was relayed during progression rounds [name].  12/3: On interview [name], Amber is found resting in bed.  She is calm Amber cooperative.  She is alert Amber oriented to [name], date, Amber Amber [name].  She is more engaged in interview [name].  She was noted to be tearful during treatment team [name] [name].  She is tolerating current medication regimen well without adverse effects. Methadone  is continued.  She reports good sleep Amber fair appetite.  She rates depression as 3 out [name] 10 Amber endorses situational anxiety.  She denies SI/HI/plan Amber denies hallucinations.  Per nursing report, Amber has displayed some confusion on the unit.  She did not require any behavioral PRNs overnight.  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  She provides consent to contact mother for collateral.  She does not voice any concerns or complaints at this time.  Past Psychiatric History: see h&P Family History:  Family History  Problem  Relation Age [name] Onset   Cancer Maternal Uncle    Diabetes Maternal Grandmother    Cancer Maternal Grandmother        colon   Diabetes Paternal Grandmother    Cancer Paternal Grandmother        bone   Other Neg Hx    Social History:  Social History   Substance Amber Sexual Activity  Alcohol Use No   Comment: occ     Social History   Substance Amber Sexual Activity  Drug Use No   Types: Benzodiazepines, Marijuana, Oxycodone    Comment: no drug use since pregnancy-  NONE SINCE     Social History   Socioeconomic History   Marital status: Single    Spouse name: Not on file   Number [name] children: 1   Years [name] education: Not on file   Highest education level: Not on file  Occupational History   Not on file  Tobacco Use   Smoking status: Every Day    Types: E-cigarettes   Smokeless tobacco: Never  Vaping Use   Vaping status: Every Day  Substance Amber Sexual Activity   Alcohol use: No    Comment: occ   Drug use: No    Types: Benzodiazepines, Marijuana, Oxycodone     Comment: no drug use since pregnancy-  NONE SINCE    Sexual activity: Not Currently    Birth control/protection: Implant  Other Topics Concern   Not on file  Social History Narrative   Right handed   One story home   Drinks caffeine    Social Drivers [name] Corporate Investment Banker  Strain: Low Risk  (12/16/2021)   Overall Financial Resource Strain (CARDIA)    Difficulty [name] Paying Living Expenses: Not hard at all  Food Insecurity: No Food Insecurity (02/05/2024)   Hunger Vital Sign    Worried About Running Out [name] Food in the Last Year: Never true    Ran Out [name] Food in the Last Year: Never true  Transportation Needs: No Transportation Needs (02/05/2024)   PRAPARE - Administrator, Civil Service (Medical): No    Lack [name] Transportation (Non-Medical): No  Physical Activity: Sufficiently Active (12/16/2021)   Exercise Vital Sign    Days [name] Exercise per Week: 7 days    Minutes [name] Exercise per Session:  60 min  Stress: No Stress Concern Present (12/16/2021)   [employer] [name] Occupational Health - Occupational Stress Questionnaire    Feeling [name] Stress : Only a little  Social Connections: Moderately Isolated (12/16/2021)   Social Connection Amber Isolation Panel    Frequency [name] Communication with Friends Amber Family: More than three times a week    Frequency [name] Social Gatherings with Friends Amber Family: Once a week    Attends Religious Services: More than 4 times per year    Active Member [name] [employer] or Organizations: No    Attends Banker Meetings: Never    Marital Status: Never married   Past Medical History:  Past Medical History:  Diagnosis Date   Abnormal Pap smear    GBS (group B streptococcus) UTI complicating pregnancy 04/17/2012   IC (interstitial cystitis) 04/17/2012   Interstitial cystitis    Ovarian cyst    Protein in urine    Renal abscess    Seizures (HCC)     Past Surgical History:  Procedure Laterality Date   DILATION Amber CURETTAGE [name] UTERUS     IR FLUORO GUIDED NEEDLE PLC ASPIRATION/INJECTION LOC  01/03/2020    Current Medications: Current Facility-Administered Medications  Medication Dose Route Frequency Provider Last Rate Last Admin   acetaminophen  (TYLENOL ) tablet 650 mg  650 mg Oral Q6H PRN Ajibola, Ene A, NP   650 mg at 02/07/24 2039   alum & mag hydroxide-simeth (MAALOX/MYLANTA) 200-200-20 MG/5ML suspension 30 mL  30 mL Oral Q4H PRN Ajibola, Ene A, NP       haloperidol  (HALDOL ) tablet 5 mg  5 mg Oral TID PRN Ajibola, Ene A, NP   5 mg at 02/05/24 2303   Amber   diphenhydrAMINE  (BENADRYL ) capsule 50 mg  50 mg Oral TID PRN Ajibola, Ene A, NP   50 mg at 02/05/24 2303   haloperidol  lactate (HALDOL ) injection 5 mg  5 mg Intramuscular TID PRN Ajibola, Ene A, NP       Amber   diphenhydrAMINE  (BENADRYL ) injection 50 mg  50 mg Intramuscular TID PRN Ajibola, Ene A, NP       Amber   LORazepam  (ATIVAN ) injection 2 mg  2 mg Intramuscular TID PRN Ajibola, Ene A,  NP       haloperidol  lactate (HALDOL ) injection 10 mg  10 mg Intramuscular TID PRN Ajibola, Ene A, NP       Amber   diphenhydrAMINE  (BENADRYL ) injection 50 mg  50 mg Intramuscular TID PRN Ajibola, Ene A, NP       Amber   LORazepam  (ATIVAN ) injection 2 mg  2 mg Intramuscular TID PRN Ajibola, Ene A, NP       hydrOXYzine (ATARAX) tablet 25 mg  25 mg Oral TID PRN Ajibola, Ene A, NP  magnesium  hydroxide (MILK [name] MAGNESIA) suspension 30 mL  30 mL Oral Daily PRN Ajibola, Ene A, NP       methadone  (DOLOPHINE ) 10 MG/ML solution 145 mg  145 mg Oral Daily Jadapalle, Sree, MD   145 mg at 02/08/24 9175   nicotine (NICODERM CQ - dosed in mg/24 hours) patch 14 mg  14 mg Transdermal Daily Jadapalle, Sree, MD   14 mg at 02/07/24 9157   ondansetron  (ZOFRAN -ODT) disintegrating tablet 4 mg  4 mg Oral Q8H PRN Trudy Carwin, NP   4 mg at 02/08/24 1044   traZODone (DESYREL) tablet 50 mg  50 mg Oral QHS PRN Ajibola, Ene A, NP   50 mg at 02/05/24 2141    Lab Results: No results found for this or any previous visit (from the past 48 hours).  Blood Alcohol level:  Lab Results  Component Value Date   [facility] <15 02/04/2024    Metabolic Disorder Labs: Lab Results  Component Value Date   HGBA1C 5.3 02/04/2024   MPG 105 02/04/2024   MPG 111 01/01/2020   No results found for: PROLACTIN Lab Results  Component Value Date   CHOL 131 02/04/2024   TRIG 41 02/04/2024   HDL 41 02/04/2024   CHOLHDL 3.2 02/04/2024   VLDL 8 02/04/2024   LDLCALC 82 02/04/2024   LDLCALC 111 (H) 01/01/2020    Physical Findings: AIMS:  , ,  ,  ,    CIWA:    COWS:      Psychiatric Specialty Exam:  Presentation  General Appearance:  Disheveled  Eye Contact: Fair  Speech: Clear Amber Coherent  Speech Volume: Decreased    Mood Amber Affect  Mood: Depressed  Affect: Appropriate    Thought Process  Thought Processes: Coherent  Orientation:Full (Time, Place Amber [name])  Thought Content:WDL  Hallucinations:  None Ideas [name] Reference:None  Suicidal Thoughts: No Homicidal Thoughts: No  Sensorium  Memory: Immediate Fair; Recent Fair  Judgment: Poor  Insight: Poor   Art Therapist  Concentration: Fair  Attention Span: Fair  Recall: [employer] [name] Knowledge: Fair  Language: Fair   Psychomotor Activity  Psychomotor Activity: Normal  Musculoskeletal: Strength & Muscle Tone: within normal limits Gait & Station: normal Assets  Assets: Manufacturing Systems Engineer; Housing; Social Support    Physical Exam: Physical Exam ROS Blood pressure (!) 100/58, pulse 78, temperature 97.9 F (36.6 C), temperature source Oral, resp. rate 19, height 5' 5 (1.651 m), weight 70.3 kg, SpO2 98%. Body mass index is 25.79 kg/m.  Diagnosis: Principal Problem:   Psychosis, unspecified psychosis type (HCC) Active Problems:   Polysubstance abuse (HCC)   PLAN: Safety Amber Monitoring:  -- Voluntary admission to inpatient psychiatric unit for safety, stabilization Amber treatment  -- Daily contact with Amber to assess Amber evaluate symptoms Amber progress in treatment  -- Amber's Amber to be discussed in multi-disciplinary team [name]  -- Observation Level : q15 minute checks  -- Vital signs:  q12 hours  -- Precautions: suicide, elopement, Amber assault -- Encouraged Amber to participate in unit milieu Amber in scheduled group therapies  2. Psychiatric Treatment:  Scheduled Medications:  Methadone  continued, dose confirmed with [facility], last dose with [facility] 01/28/24     -- The risks/benefits/side-effects/alternatives to this medication were discussed in detail with the Amber Amber time was given for questions. The Amber consents to medication trial.  3. Medical Issues Being Addressed:  No acute concerns.    4. Discharge Planning:   -- Social work Amber  Amber management to assist with discharge planning Amber identification [name] hospital follow-up needs prior to discharge  -- Estimated  LOS: 5-7 days  Zelda Sharps, NP This note was created using Dragon dictation software. Please excuse any inadvertent transcription errors. Amber was discussed with supervising physician Dr. Jadapalle who is agreeable with current plan.

## 2024-02-08 NOTE — Group Note (Signed)
 Date:  02/08/2024 Time:  10:12 AM  Group Topic/Focus:  Goals Group:   The focus of this group is to help patients establish daily goals to achieve during treatment and discuss how the patient can incorporate goal setting into their daily lives to aide in recovery.    Participation Level:  Did Not Attend   Deitra Clap [facility] 02/08/2024, 10:12 AM

## 2024-02-08 NOTE — Plan of Care (Signed)
   Problem: Education: Goal: Emotional status will improve Outcome: Progressing

## 2024-02-09 NOTE — Plan of Care (Signed)
 Amber [name] is a 33 y.o. female patient. No diagnosis found. Past Medical History:  Diagnosis Date   Abnormal Pap smear    GBS (group B streptococcus) UTI complicating pregnancy 04/17/2012   IC (interstitial cystitis) 04/17/2012   Interstitial cystitis    Ovarian cyst    Protein in urine    Renal abscess    Seizures (HCC)    Current Facility-Administered Medications  Medication Dose Route Frequency Provider Last Rate Last Admin   acetaminophen  (TYLENOL ) tablet 650 mg  650 mg Oral Q6H PRN Ajibola, Ene A, NP   650 mg at 02/08/24 1558   alum & mag hydroxide-simeth (MAALOX/MYLANTA) 200-200-20 MG/5ML suspension 30 mL  30 mL Oral Q4H PRN Ajibola, Ene A, NP       haloperidol  (HALDOL ) tablet 5 mg  5 mg Oral TID PRN Ajibola, Ene A, NP   5 mg at 02/05/24 2303   And   diphenhydrAMINE  (BENADRYL ) capsule 50 mg  50 mg Oral TID PRN Ajibola, Ene A, NP   50 mg at 02/05/24 2303   haloperidol  lactate (HALDOL ) injection 5 mg  5 mg Intramuscular TID PRN Ajibola, Ene A, NP       And   diphenhydrAMINE  (BENADRYL ) injection 50 mg  50 mg Intramuscular TID PRN Ajibola, Ene A, NP       And   LORazepam  (ATIVAN ) injection 2 mg  2 mg Intramuscular TID PRN Ajibola, Ene A, NP       haloperidol  lactate (HALDOL ) injection 10 mg  10 mg Intramuscular TID PRN Ajibola, Ene A, NP       And   diphenhydrAMINE  (BENADRYL ) injection 50 mg  50 mg Intramuscular TID PRN Ajibola, Ene A, NP       And   LORazepam  (ATIVAN ) injection 2 mg  2 mg Intramuscular TID PRN Ajibola, Ene A, NP       hydrOXYzine  (ATARAX ) tablet 25 mg  25 mg Oral TID PRN Ajibola, Ene A, NP       magnesium  hydroxide (MILK OF MAGNESIA) suspension 30 mL  30 mL Oral Daily PRN Ajibola, Ene A, NP       methadone  (DOLOPHINE ) 10 MG/ML solution 145 mg  145 mg Oral Daily Jadapalle, Sree, MD   145 mg at 02/08/24 9175   nicotine  (NICODERM CQ  - dosed in mg/24 hours) patch 14 mg  14 mg Transdermal Daily Jadapalle, Sree, MD   14 mg at 02/07/24 0842   ondansetron  (ZOFRAN -ODT)  disintegrating tablet 4 mg  4 mg Oral Q8H PRN Trudy Carwin, NP   4 mg at 02/08/24 1044   traZODone  (DESYREL ) tablet 50 mg  50 mg Oral QHS PRN Ajibola, Ene A, NP   50 mg at 02/05/24 2141   Allergies  Allergen Reactions   Biaxin [Clarithromycin] Anaphylaxis   Principal Problem:   Psychosis, unspecified psychosis type [facility]) Active Problems:   Polysubstance abuse (HCC)  Blood pressure 97/61, pulse 64, temperature 97.9 F (36.6 C), temperature source Oral, resp. rate 19, height 5' 5 (1.651 m), weight 70.3 kg, SpO2 100%.    Amber [name] 02/09/2024

## 2024-02-09 NOTE — Progress Notes (Signed)
   02/09/24 0830  Psych Admission Type (Psych Patients Only)  Admission Status Involuntary  Psychosocial Assessment  Patient Complaints None  Eye Contact Fair  Facial Expression Flat  Affect Appropriate to circumstance  Speech Logical/coherent  Interaction Minimal  Motor Activity Slow  Appearance/Hygiene Unremarkable  Behavior Characteristics Appropriate to situation;Cooperative  Mood Pleasant  Thought Process  Coherency WDL  Content WDL  Delusions None reported or observed  Perception WDL  Hallucination None reported or observed  Judgment Impaired  Confusion None  Danger to Self  Current suicidal ideation? Denies  Agreement Not to Harm Self Yes  Description of Agreement Verbal  Danger to Others  Danger to Others None reported or observed

## 2024-02-09 NOTE — Progress Notes (Signed)
   02/09/24 0259  Psych Admission Type (Psych Patients Only)  Admission Status Involuntary  Psychosocial Assessment  Patient Complaints None  Eye Contact Brief  Facial Expression Animated  Affect Appropriate to circumstance  Speech Soft  Interaction Minimal  Motor Activity Slow  Appearance/Hygiene Unremarkable  Behavior Characteristics Appropriate to situation  Mood Pleasant  Thought Process  Coherency WDL  Content WDL  Delusions None reported or observed  Perception WDL  Hallucination None reported or observed  Judgment Impaired  Confusion Mild  Danger to Self  Current suicidal ideation? Denies  Agreement Not to Harm Self Yes  Description of Agreement Verbal

## 2024-02-09 NOTE — Group Note (Signed)
 Recreation Therapy Group Note   Group Topic:Leisure Education  Group Date: 02/09/2024 Start Time: 1025 End Time: 1125 Facilitators: Celestia Jeoffrey BRAVO, LRT, CTRS Location: Craft Room  Group Description: Leisure. Patients were given the option to choose from journaling, coloring, drawing, making origami, playing with playdoh, listening to music or singing karaoke. LRT and pts discussed the meaning of leisure, the importance of participating in leisure during their free time/when they're outside of the hospital, as well as how our leisure interests can also serve as coping skills.  Goal Area(s) Addressed:  Patient will identify a current leisure interest.  Patient will learn the definition of "leisure". Patient will practice making a positive decision. Patient will have the opportunity to try a new leisure activity. Patient will communicate with peers and LRT.    Affect/Mood: N/A   Participation Level: Did not attend    Clinical Observations/Individualized Feedback: Patient did not attend group.   Plan: Continue to engage patient in RT group sessions 2-3x/week.   Jeoffrey BRAVO Celestia, LRT, CTRS 02/09/2024 12:20 PM

## 2024-02-09 NOTE — Group Note (Signed)
 Recreation Therapy Group Note   Group Topic:Coping Skills  Group Date: 02/09/2024 Start Time: 1530 End Time: 1610 Facilitators: Celestia Jeoffrey BRAVO, LRT, CTRS Location: Dayroom  Group Description: Meditation. LRT asks patients their current level of stress/anxiety from 1-10, with 10 being the highest. LRT educated on the benefits of meditation and relaxation techniques, and how it can apply to everyday life post-discharge. LRT and pt's followed along to an audio script of a "guided meditation" video. LRT asked pt their level of stress and anxiety once the prompt was finished. LRT facilitated post-activity processing to gain feedback on session.   Goal Area(s) Addressed:  Patient will practice using relaxation technique. Patient will identify a new coping skill.  Patient will follow multistep directions to reduce anxiety and stress.   Affect/Mood: N/A   Participation Level: Did not attend    Clinical Observations/Individualized Feedback: Patient did not attend group.   Plan: Continue to engage patient in RT group sessions 2-3x/week.   Jeoffrey BRAVO Celestia, LRT, CTRS 02/09/2024 5:13 PM

## 2024-02-09 NOTE — [facility] Counselor (Signed)
 CSW spoke with the patient's mother.    Mother reports that she would like CSW to complete referral form [facility].  CSW explained that at this time pt is declining residential treatment. CSW requested that the pt and mother speak to one another to discuss the matter further.   Mother also reports that per the patient she is to be discharged on 02/10/2024.  CSW explained that CSW is unaware of a d/c but will follow up with mother.   Sherryle Margo, MSW, LCSW 02/09/2024 1:10 PM

## 2024-02-09 NOTE — Group Note (Signed)
 Date:  02/09/2024 Time:  10:14 AM  Group Topic/Focus:  Building Self Esteem:   The Focus of this group is helping patients become aware of the effects of self-esteem on their lives, the things they and others do that enhance or undermine their self-esteem, seeing the relationship between their level of self-esteem and the choices they make and learning ways to enhance self-esteem.    Participation Level:  Active  Participation Quality:  Appropriate  Affect:  Appropriate  Cognitive:  Appropriate  Insight: Appropriate  Engagement in Group:  Engaged  Modes of Intervention:  Activity  Additional Comments:    Amber [name] 02/09/2024, 10:14 AM

## 2024-02-09 NOTE — Group Note (Signed)
 Date:  02/09/2024 Time:  8:49 PM  Group Topic/Focus:  Healthy Communication:   The focus of this group is to discuss communication, barriers to communication, as well as healthy ways to communicate with others.    Participation Level:  Active  Participation Quality:  Appropriate  Affect:  Appropriate  Cognitive:  Appropriate  Insight: Appropriate  Engagement in Group:  Engaged  Modes of Intervention:  Discussion  Additional Comments:    Garlene Apperson L 02/09/2024, 8:49 PM

## 2024-02-09 NOTE — Plan of Care (Signed)
   Problem: Education: Goal: Emotional status will improve Outcome: Progressing Goal: Mental status will improve Outcome: Progressing Goal: Verbalization of understanding the information provided will improve Outcome: Progressing

## 2024-02-10 NOTE — Progress Notes (Signed)
   02/10/24 0820  Psych Admission Type (Psych Patients Only)  Admission Status Involuntary  Psychosocial Assessment  Patient Complaints None  Eye Contact Fair  Facial Expression Flat  Affect Appropriate to circumstance  Speech Logical/coherent  Interaction Minimal  Motor Activity Slow  Appearance/Hygiene Unremarkable  Behavior Characteristics Appropriate to situation;Cooperative  Mood Pleasant  Thought Process  Coherency WDL  Content WDL  Delusions None reported or observed  Perception WDL  Hallucination None reported or observed  Judgment Impaired  Confusion None  Danger to Self  Current suicidal ideation? Denies  Agreement Not to Harm Self Yes  Description of Agreement verbal  Danger to Others  Danger to Others None reported or observed

## 2024-02-10 NOTE — Group Note (Signed)
 Date:  02/10/2024 Time:  6:33 PM  Group Topic/Focus:  Wellness Toolbox:   The focus of this group is to discuss various aspects of wellness, balancing those aspects and exploring ways to increase the ability to experience wellness.  Patients will create a wellness toolbox for use upon discharge.    Participation Level:  Active  Participation Quality:  Appropriate  Affect:  Appropriate  Cognitive:  Appropriate  Insight: Appropriate  Engagement in Group:  Engaged  Modes of Intervention:  Activity  Additional Comments:    [name] Sammie Schermerhorn 02/10/2024, 6:33 PM

## 2024-02-10 NOTE — Group Note (Signed)
 Date:  02/10/2024 Time:  5:16 PM  Group Topic/Focus:  Goals Group:   The focus of this group is to help patients establish daily goals to achieve during treatment and discuss how the patient can incorporate goal setting into their daily lives to aide in recovery.    Participation Level:  Active  Participation Quality:  Appropriate and Attentive  Affect:  Appropriate  Cognitive:  Alert and Appropriate  Insight: Appropriate and Good  Engagement in Group:  Engaged  Modes of Intervention:  Discussion  Additional Comments:    Bonnielee LOISE Pepper 02/10/2024, 5:16 PM

## 2024-02-10 NOTE — Progress Notes (Incomplete)
 [facility] MD Progress Note  02/10/2024 9:15AM Amber [name]  MRN:  [id number]   Subjective:  Chart reviewed, case discussed in multidisciplinary meeting, patient seen during rounds.   12/6: On interview today, patient is noted to be pleasant, calm and cooperative, alert and oriented.  She is noted to have a bright affect today.  She is linear and logical on exam.  She reports good sleep and improved appetite.  She denies current symptoms of depression and endorses situational anxiety.  She denies SI/HI/plan and denies hallucinations.  She is tolerating medication regimen well without adverse effects.  She does not voice any concerns or complaints at this time.   Past Psychiatric History: see h&P Family History:  Family History  Problem Relation Age of Onset   Cancer Maternal Uncle    Diabetes Maternal Grandmother    Cancer Maternal Grandmother        colon   Diabetes Paternal Grandmother    Cancer Paternal Grandmother        bone   Other Neg Hx    Social History:  Social History   Substance and Sexual Activity  Alcohol Use No   Comment: occ     Social History   Substance and Sexual Activity  Drug Use No   Types: Benzodiazepines, Marijuana, Oxycodone    Comment: no drug use since pregnancy-  NONE SINCE     Social History   Socioeconomic History   Marital status: Single    Spouse name: Not on file   Number of children: 1   Years of education: Not on file   Highest education level: Not on file  Occupational History   Not on file  Tobacco Use   Smoking status: Every Day    Types: E-cigarettes   Smokeless tobacco: Never  Vaping Use   Vaping status: Every Day  Substance and Sexual Activity   Alcohol use: No    Comment: occ   Drug use: No    Types: Benzodiazepines, Marijuana, Oxycodone     Comment: no drug use since pregnancy-  NONE SINCE    Sexual activity: Not Currently    Birth control/protection: Implant  Other Topics Concern   Not on file  Social History Narrative    Right handed   One story home   Drinks caffeine    Social Drivers of Health   Financial Resource Strain: Low Risk  (12/16/2021)   Overall Financial Resource Strain (CARDIA)    Difficulty of Paying Living Expenses: Not hard at all  Food Insecurity: No Food Insecurity (02/05/2024)   Hunger Vital Sign    Worried About Running Out of Food in the Last Year: Never true    Ran Out of Food in the Last Year: Never true  Transportation Needs: No Transportation Needs (02/05/2024)   PRAPARE - Administrator, Civil Service (Medical): No    Lack of Transportation (Non-Medical): No  Physical Activity: Sufficiently Active (12/16/2021)   Exercise Vital Sign    Days of Exercise per Week: 7 days    Minutes of Exercise per Session: 60 min  Stress: No Stress Concern Present (12/16/2021)   [employer] of Occupational Health - Occupational Stress Questionnaire    Feeling of Stress : Only a little  Social Connections: Moderately Isolated (12/16/2021)   Social Connection and Isolation Panel    Frequency of Communication with Friends and Family: More than three times a week    Frequency of Social Gatherings with Friends and Family: Once a week  Attends Religious Services: More than 4 times per year    Active Member of Clubs or Organizations: No    Attends Banker Meetings: Never    Marital Status: Never married   Past Medical History:  Past Medical History:  Diagnosis Date   Abnormal Pap smear    GBS (group B streptococcus) UTI complicating pregnancy 04/17/2012   IC (interstitial cystitis) 04/17/2012   Interstitial cystitis    Ovarian cyst    Protein in urine    Renal abscess    Seizures (HCC)     Past Surgical History:  Procedure Laterality Date   DILATION AND CURETTAGE OF UTERUS     IR FLUORO GUIDED NEEDLE PLC ASPIRATION/INJECTION LOC  01/03/2020    Current Medications: Current Facility-Administered Medications  Medication Dose Route Frequency Provider Last  Rate Last Admin   acetaminophen  (TYLENOL ) tablet 650 mg  650 mg Oral Q6H PRN Ajibola, Ene A, NP   650 mg at 02/08/24 1558   alum & mag hydroxide-simeth (MAALOX/MYLANTA) 200-200-20 MG/5ML suspension 30 mL  30 mL Oral Q4H PRN Ajibola, Ene A, NP   30 mL at 02/09/24 1611   haloperidol  (HALDOL ) tablet 5 mg  5 mg Oral TID PRN Ajibola, Ene A, NP   5 mg at 02/05/24 2303   And   diphenhydrAMINE  (BENADRYL ) capsule 50 mg  50 mg Oral TID PRN Ajibola, Ene A, NP   50 mg at 02/05/24 2303   haloperidol  lactate (HALDOL ) injection 5 mg  5 mg Intramuscular TID PRN Ajibola, Ene A, NP       And   diphenhydrAMINE  (BENADRYL ) injection 50 mg  50 mg Intramuscular TID PRN Ajibola, Ene A, NP       And   LORazepam  (ATIVAN ) injection 2 mg  2 mg Intramuscular TID PRN Ajibola, Ene A, NP       haloperidol  lactate (HALDOL ) injection 10 mg  10 mg Intramuscular TID PRN Ajibola, Ene A, NP       And   diphenhydrAMINE  (BENADRYL ) injection 50 mg  50 mg Intramuscular TID PRN Ajibola, Ene A, NP       And   LORazepam  (ATIVAN ) injection 2 mg  2 mg Intramuscular TID PRN Ajibola, Ene A, NP       hydrOXYzine  (ATARAX ) tablet 25 mg  25 mg Oral TID PRN Ajibola, Ene A, NP       magnesium  hydroxide (MILK OF MAGNESIA) suspension 30 mL  30 mL Oral Daily PRN Ajibola, Ene A, NP       methadone  (DOLOPHINE ) 10 MG/ML solution 145 mg  145 mg Oral Daily Jadapalle, Sree, MD   145 mg at 02/10/24 9178   nicotine  (NICODERM CQ  - dosed in mg/24 hours) patch 14 mg  14 mg Transdermal Daily Jadapalle, Sree, MD   14 mg at 02/10/24 9178   ondansetron  (ZOFRAN -ODT) disintegrating tablet 4 mg  4 mg Oral Q8H PRN Trudy Carwin, NP   4 mg at 02/09/24 2134   traZODone  (DESYREL ) tablet 50 mg  50 mg Oral QHS PRN Ajibola, Ene A, NP   50 mg at 02/09/24 2121    Lab Results: No results found for this or any previous visit (from the past 48 hours).  Blood Alcohol level:  Lab Results  Component Value Date   [facility] <15 02/04/2024    Metabolic Disorder Labs: Lab Results   Component Value Date   HGBA1C 5.3 02/04/2024   MPG 105 02/04/2024   MPG 111 01/01/2020   No results  found for: PROLACTIN Lab Results  Component Value Date   CHOL 131 02/04/2024   TRIG 41 02/04/2024   HDL 41 02/04/2024   CHOLHDL 3.2 02/04/2024   VLDL 8 02/04/2024   LDLCALC 82 02/04/2024   LDLCALC 111 (H) 01/01/2020    Physical Findings: AIMS:  , ,  ,  ,    CIWA:    COWS:      Psychiatric Specialty Exam:  Presentation  General Appearance:  Disheveled  Eye Contact: Fair  Speech: Clear and Coherent  Speech Volume: Normal    Mood and Affect  Mood: Euthymic  Affect: Appropriate    Thought Process  Thought Processes: Coherent  Orientation:Full (Time, Place and Person)  Thought Content:WDL  Hallucinations: None Ideas of Reference:None  Suicidal Thoughts: No Homicidal Thoughts: No  Sensorium  Memory: Immediate Fair; Recent Fair  Judgment: Poor  Insight: Poor   Art Therapist  Concentration: Fair  Attention Span: Fair  Recall: [employer] of Knowledge: Fair  Language: Fair   Psychomotor Activity  Psychomotor Activity: Normal  Musculoskeletal: Strength & Muscle Tone: within normal limits Gait & Station: normal Assets  Assets: Manufacturing Systems Engineer; Housing; Social Support    Physical Exam: Physical Exam ROS Blood pressure 94/61, pulse 69, temperature 98.7 F (37.1 C), temperature source Oral, resp. rate 19, height 5' 5 (1.651 m), weight 70.3 kg, SpO2 97%. Body mass index is 25.79 kg/m.  Diagnosis: Principal Problem:   Psychosis, unspecified psychosis type (HCC) Active Problems:   Polysubstance abuse (HCC)   PLAN: Safety and Monitoring:  -- Voluntary admission to inpatient psychiatric unit for safety, stabilization and treatment  -- Daily contact with patient to assess and evaluate symptoms and progress in treatment  -- Patient's case to be discussed in multi-disciplinary team meeting  -- Observation  Level : q15 minute checks  -- Vital signs:  q12 hours  -- Precautions: suicide, elopement, and assault -- Encouraged patient to participate in unit milieu and in scheduled group therapies  2. Psychiatric Treatment:  Scheduled Medications:  Methadone  continued, dose confirmed with [facility], last dose with [facility] 01/28/24     -- The risks/benefits/side-effects/alternatives to this medication were discussed in detail with the patient and time was given for questions. The patient consents to medication trial.  3. Medical Issues Being Addressed:  No acute concerns.    4. Discharge Planning:             -- Likely Tuesday  -- Social work and case management to assist with discharge planning and identification of hospital follow-up needs prior to discharge  -- Estimated LOS: 5-7 days  [employer], PA-C This note was created using Scientist, clinical (histocompatibility and immunogenetics). Please excuse any inadvertent transcription errors. Case was discussed with attending physician Dr. Jadapalle who is agreeable with current plan.

## 2024-02-10 NOTE — Group Note (Signed)
 Date:  02/10/2024 Time:  8:30 PM  Group Topic/Focus:  Managing Feelings:   The focus of this group is to identify what feelings patients have difficulty handling and develop a plan to handle them in a healthier way upon discharge. Wrap-Up Group:   The focus of this group is to help patients review their daily goal of treatment and discuss progress on daily workbooks.    Participation Level:  Active  Participation Quality:  Appropriate and Attentive  Affect:  Appropriate  Cognitive:  Alert, Appropriate, and Oriented  Insight: Appropriate and Good  Engagement in Group:  Engaged  Modes of Intervention:  Activity, Discussion, and Support  Additional Comments:  N/A  Butler LITTIE Gelineau 02/10/2024, 8:30 PM

## 2024-02-10 NOTE — Plan of Care (Signed)
  Problem: Education: Goal: Emotional status will improve Outcome: Progressing   Problem: Education: Goal: Knowledge of [facility] Education information/materials will improve Outcome: Progressing   Problem: Education: Goal: Mental status will improve Outcome: Progressing

## 2024-02-11 NOTE — Progress Notes (Signed)
   02/11/24 2100  Psych Admission Type (Psych Patients Only)  Admission Status Involuntary  Psychosocial Assessment  Patient Complaints Sadness  Eye Contact Fair  Facial Expression Animated;Trembling lip;Sad  Affect Sad  Speech Logical/coherent  Interaction Assertive  Motor Activity Other (Comment) (appropriate for developmental age)  Appearance/Hygiene Unremarkable  Behavior Characteristics Cooperative;Appropriate to situation  Mood Pleasant;Sad  Thought Process  Coherency WDL  Content WDL  Delusions None reported or observed  Perception WDL  Hallucination None reported or observed  Judgment Poor (improving)  Confusion None  Danger to Self  Current suicidal ideation? Denies  Self-Injurious Behavior No self-injurious ideation or behavior indicators observed or expressed   Danger to Others  Danger to Others None reported or observed

## 2024-02-11 NOTE — Progress Notes (Signed)
   02/11/24 0800  Psych Admission Type (Psych Patients Only)  Admission Status Involuntary  Psychosocial Assessment  Patient Complaints None  Eye Contact Fair  Facial Expression Flat  Affect Appropriate to circumstance  Speech Logical/coherent  Interaction Minimal  Motor Activity Slow  Appearance/Hygiene Unremarkable  Behavior Characteristics Cooperative;Appropriate to situation  Mood Pleasant  Thought Process  Coherency WDL  Content WDL  Delusions None reported or observed  Perception WDL  Hallucination None reported or observed  Judgment Impaired  Confusion None  Danger to Self  Current suicidal ideation? Denies  Agreement Not to Harm Self Yes  Description of Agreement verbal  Danger to Others  Danger to Others None reported or observed

## 2024-02-11 NOTE — Progress Notes (Signed)
 [facility] MD Progress Note  02/11/2024 12:55 PM Amber [name]  MRN:  [id number]   Subjective:  Chart reviewed, case discussed in multidisciplinary meeting, patient seen during rounds.   12/7: On interview today, patient is found interacting in the milieu.  She is noted to be pleasant, calm and cooperative, alert and oriented.  She reports good sleep and appetite.  She denies current symptoms of depression or anxiety.  She denies SI/HI/plan and denies hallucinations.  She is able to discuss coping skills, crisis resources, and support system.  She is tolerating medication regimen well without adverse effects.  She denies any concerns or complaints at this time.  12/6: On interview today, patient is noted to be pleasant, calm and cooperative, alert and oriented.  She is noted to have a bright affect today.  She is linear and logical on exam.  She reports good sleep and improved appetite.  She denies current symptoms of depression and endorses situational anxiety.  She denies SI/HI/plan and denies hallucinations.  She is tolerating medication regimen well without adverse effects.  She does not voice any concerns or complaints at this time.   Past Psychiatric History: see h&P Family History:  Family History  Problem Relation Age of Onset   Cancer Maternal Uncle    Diabetes Maternal Grandmother    Cancer Maternal Grandmother        colon   Diabetes Paternal Grandmother    Cancer Paternal Grandmother        bone   Other Neg Hx    Social History:  Social History   Substance and Sexual Activity  Alcohol Use No   Comment: occ     Social History   Substance and Sexual Activity  Drug Use No   Types: Benzodiazepines, Marijuana, Oxycodone    Comment: no drug use since pregnancy-  NONE SINCE     Social History   Socioeconomic History   Marital status: Single    Spouse name: Not on file   Number of children: 1   Years of education: Not on file   Highest education level: Not on file   Occupational History   Not on file  Tobacco Use   Smoking status: Every Day    Types: E-cigarettes   Smokeless tobacco: Never  Vaping Use   Vaping status: Every Day  Substance and Sexual Activity   Alcohol use: No    Comment: occ   Drug use: No    Types: Benzodiazepines, Marijuana, Oxycodone     Comment: no drug use since pregnancy-  NONE SINCE    Sexual activity: Not Currently    Birth control/protection: Implant  Other Topics Concern   Not on file  Social History Narrative   Right handed   One story home   Drinks caffeine    Social Drivers of Health   Financial Resource Strain: Low Risk  (12/16/2021)   Overall Financial Resource Strain (CARDIA)    Difficulty of Paying Living Expenses: Not hard at all  Food Insecurity: No Food Insecurity (02/05/2024)   Hunger Vital Sign    Worried About Running Out of Food in the Last Year: Never true    Ran Out of Food in the Last Year: Never true  Transportation Needs: No Transportation Needs (02/05/2024)   PRAPARE - Administrator, Civil Service (Medical): No    Lack of Transportation (Non-Medical): No  Physical Activity: Sufficiently Active (12/16/2021)   Exercise Vital Sign    Days of Exercise per Week: 7 days  Minutes of Exercise per Session: 60 min  Stress: No Stress Concern Present (12/16/2021)   [employer] of Occupational Health - Occupational Stress Questionnaire    Feeling of Stress : Only a little  Social Connections: Moderately Isolated (12/16/2021)   Social Connection and Isolation Panel    Frequency of Communication with Friends and Family: More than three times a week    Frequency of Social Gatherings with Friends and Family: Once a week    Attends Religious Services: More than 4 times per year    Active Member of [employer] or Organizations: No    Attends Banker Meetings: Never    Marital Status: Never married   Past Medical History:  Past Medical History:  Diagnosis Date   Abnormal  Pap smear    GBS (group B streptococcus) UTI complicating pregnancy 04/17/2012   IC (interstitial cystitis) 04/17/2012   Interstitial cystitis    Ovarian cyst    Protein in urine    Renal abscess    Seizures (HCC)     Past Surgical History:  Procedure Laterality Date   DILATION AND CURETTAGE OF UTERUS     IR FLUORO GUIDED NEEDLE PLC ASPIRATION/INJECTION LOC  01/03/2020    Current Medications: Current Facility-Administered Medications  Medication Dose Route Frequency Provider Last Rate Last Admin   acetaminophen  (TYLENOL ) tablet 650 mg  650 mg Oral Q6H PRN Ajibola, Ene A, NP   650 mg at 02/10/24 1354   alum & mag hydroxide-simeth (MAALOX/MYLANTA) 200-200-20 MG/5ML suspension 30 mL  30 mL Oral Q4H PRN Ajibola, Ene A, NP   30 mL at 02/09/24 1611   haloperidol  (HALDOL ) tablet 5 mg  5 mg Oral TID PRN Ajibola, Ene A, NP   5 mg at 02/05/24 2303   And   diphenhydrAMINE  (BENADRYL ) capsule 50 mg  50 mg Oral TID PRN Ajibola, Ene A, NP   50 mg at 02/05/24 2303   haloperidol  lactate (HALDOL ) injection 5 mg  5 mg Intramuscular TID PRN Ajibola, Ene A, NP       And   diphenhydrAMINE  (BENADRYL ) injection 50 mg  50 mg Intramuscular TID PRN Ajibola, Ene A, NP       And   LORazepam  (ATIVAN ) injection 2 mg  2 mg Intramuscular TID PRN Ajibola, Ene A, NP       haloperidol  lactate (HALDOL ) injection 10 mg  10 mg Intramuscular TID PRN Ajibola, Ene A, NP       And   diphenhydrAMINE  (BENADRYL ) injection 50 mg  50 mg Intramuscular TID PRN Ajibola, Ene A, NP       And   LORazepam  (ATIVAN ) injection 2 mg  2 mg Intramuscular TID PRN Ajibola, Ene A, NP       hydrOXYzine  (ATARAX ) tablet 25 mg  25 mg Oral TID PRN Ajibola, Ene A, NP       magnesium  hydroxide (MILK OF MAGNESIA) suspension 30 mL  30 mL Oral Daily PRN Ajibola, Ene A, NP       methadone  (DOLOPHINE ) 10 MG/ML solution 145 mg  145 mg Oral Daily Jadapalle, Sree, MD   145 mg at 02/11/24 0755   nicotine  (NICODERM CQ  - dosed in mg/24 hours) patch 14 mg  14 mg  Transdermal Daily Jadapalle, Sree, MD   14 mg at 02/11/24 0803   ondansetron  (ZOFRAN -ODT) disintegrating tablet 4 mg  4 mg Oral Q8H PRN Trudy Carwin, NP   4 mg at 02/11/24 0802   traZODone  (DESYREL ) tablet 50 mg  50  mg Oral QHS PRN Ajibola, Ene A, NP   50 mg at 02/09/24 2121    Lab Results: No results found for this or any previous visit (from the past 48 hours).  Blood Alcohol level:  Lab Results  Component Value Date   [facility] <15 02/04/2024    Metabolic Disorder Labs: Lab Results  Component Value Date   HGBA1C 5.3 02/04/2024   MPG 105 02/04/2024   MPG 111 01/01/2020   No results found for: PROLACTIN Lab Results  Component Value Date   CHOL 131 02/04/2024   TRIG 41 02/04/2024   HDL 41 02/04/2024   CHOLHDL 3.2 02/04/2024   VLDL 8 02/04/2024   LDLCALC 82 02/04/2024   LDLCALC 111 (H) 01/01/2020    Physical Findings: AIMS:  , ,  ,  ,    CIWA:    COWS:      Psychiatric Specialty Exam:  Presentation  General Appearance:  Casual  Eye Contact: Fair  Speech: Clear and Coherent  Speech Volume: Normal    Mood and Affect  Mood: Euthymic  Affect: Appropriate    Thought Process  Thought Processes: Coherent  Orientation:Full (Time, Place and Person)  Thought Content:WDL  Hallucinations: None Ideas of Reference:None  Suicidal Thoughts: No Homicidal Thoughts: No  Sensorium  Memory: Immediate Fair; Recent Fair  Judgment: Fair  Insight: Fair   Art Therapist  Concentration: Fair  Attention Span: Fair  Recall: [employer] of Knowledge: Fair  Language: Fair   Psychomotor Activity  Psychomotor Activity: Normal  Musculoskeletal: Strength & Muscle Tone: within normal limits Gait & Station: normal Assets  Assets: Manufacturing Systems Engineer; Housing; Social Support    Physical Exam: Physical Exam ROS Blood pressure 101/65, pulse 61, temperature 98.5 F (36.9 C), temperature source Oral, resp. rate 17, height 5' 5 (1.651 m),  weight 70.3 kg, SpO2 100%. Body mass index is 25.79 kg/m.  Diagnosis: Principal Problem:   Psychosis, unspecified psychosis type (HCC) Active Problems:   Polysubstance abuse (HCC)   PLAN: Safety and Monitoring:  -- Voluntary admission to inpatient psychiatric unit for safety, stabilization and treatment  -- Daily contact with patient to assess and evaluate symptoms and progress in treatment  -- Patient's case to be discussed in multi-disciplinary team meeting  -- Observation Level : q15 minute checks  -- Vital signs:  q12 hours  -- Precautions: suicide, elopement, and assault -- Encouraged patient to participate in unit milieu and in scheduled group therapies  2. Psychiatric Treatment:  Scheduled Medications:  Methadone  continued, dose confirmed with [facility], last dose with [facility] 01/28/24     -- The risks/benefits/side-effects/alternatives to this medication were discussed in detail with the patient and time was given for questions. The patient consents to medication trial.  3. Medical Issues Being Addressed:  No acute concerns.    4. Discharge Planning:             -- Likely Tuesday  -- Social work and case management to assist with discharge planning and identification of hospital follow-up needs prior to discharge  -- Estimated LOS: 5-7 days  [employer], PA-C This note was created using Scientist, clinical (histocompatibility and immunogenetics). Please excuse any inadvertent transcription errors. Case was discussed with attending physician Dr. Jadapalle who is agreeable with current plan.

## 2024-02-11 NOTE — Plan of Care (Signed)
  Problem: Education: Goal: Emotional status will improve Outcome: Progressing Goal: Mental status will improve Outcome: Progressing   Problem: Activity: Goal: Interest or engagement in activities will improve Outcome: Progressing   Problem: Coping: Goal: Ability to verbalize frustrations and anger appropriately will improve Outcome: Progressing   Problem: Health Behavior/Discharge Planning: Goal: Compliance with treatment plan for underlying cause of condition will improve Outcome: Progressing   Problem: Physical Regulation: Goal: Ability to maintain clinical measurements within normal limits will improve Outcome: Progressing   Problem: Safety: Goal: Periods of time without injury will increase Outcome: Progressing

## 2024-02-11 NOTE — Group Note (Signed)
 Date:  02/11/2024 Time:  10:31 PM  Group Topic/Focus:  Wrap-Up Group:   The focus of this group is to help patients review their daily goal of treatment and discuss progress on daily workbooks.    Participation Level:  Active  Participation Quality:  Appropriate and Attentive  Affect:  Appropriate  Cognitive:  Alert and Appropriate  Insight: Appropriate and Good  Engagement in Group:  Monopolizing  Modes of Intervention:  Activity  Additional Comments:     Arlester CHRISTELLA Servant 02/11/2024, 10:31 PM

## 2024-02-11 NOTE — Plan of Care (Signed)

## 2024-02-11 NOTE — Group Note (Signed)
 LCSW Group Therapy Note  Group Date: 02/11/2024 Start Time: 1510 End Time: 1550   Type of Therapy and Topic:  Group Therapy - Healthy vs Unhealthy Coping Skills  Participation Level:  Minimal   Description of Group The focus of this group was to determine what unhealthy coping techniques typically are used by group members and what healthy coping techniques would be helpful in coping with various problems. Patients were guided in becoming aware of the differences between healthy and unhealthy coping techniques. Patients were asked to identify 2-3 healthy coping skills they would like to learn to use more effectively.  Therapeutic Goals Patients learned that coping is what human beings do all day long to deal with various situations in their lives Patients defined and discussed healthy vs unhealthy coping techniques Patients identified their preferred coping techniques and identified whether these were healthy or unhealthy Patients determined 2-3 healthy coping skills they would like to become more familiar with and use more often. Patients provided support and ideas to each other   Summary of Patient Progress:  patient attended group. Patient proved open to input from peers and feedback from CSW. Patient demonstrated  insight into the subject matter, was respectful of peers.  Therapeutic Modalities Cognitive Behavioral Therapy   Tmya Wigington S Zayne Marovich, LCSWA 02/11/2024  4:44 PM

## 2024-02-11 NOTE — Plan of Care (Signed)
   Problem: Education: Goal: Emotional status will improve Outcome: Progressing Goal: Mental status will improve Outcome: Progressing

## 2024-02-11 NOTE — Progress Notes (Signed)
 [facility] MD Progress Note  02/09/2024 8:19 AM Amber [name]  MRN:  [id number]   Subjective:  Chart reviewed, case discussed in multidisciplinary meeting, patient seen during rounds.    Patient is currently denying any symptoms of mania hypomania.  Some episodes of confusion reported  12/3: On interview today, patient is found resting in bed.  She is calm and cooperative.  She is alert and oriented to person, date, and location.  She is more engaged in interview today.  She was noted to be tearful during treatment team meeting today.  She is tolerating current medication regimen well without adverse effects. Methadone  is continued.  She reports good sleep and fair appetite.  She rates depression as 3 out of 10 and endorses situational anxiety.  She denies SI/HI/plan and denies hallucinations.  Per nursing report, patient has displayed some confusion on the unit.  She did not require any behavioral PRNs overnight.  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  She provides consent to contact mother for collateral.  She does not voice any concerns or complaints at this time.  Past Psychiatric History: see h&P Family History:  Family History  Problem Relation Age of Onset   Cancer Maternal Uncle    Diabetes Maternal Grandmother    Cancer Maternal Grandmother        colon   Diabetes Paternal Grandmother    Cancer Paternal Grandmother        bone   Other Neg Hx    Social History:  Social History   Substance and Sexual Activity  Alcohol Use No   Comment: occ     Social History   Substance and Sexual Activity  Drug Use No   Types: Benzodiazepines, Marijuana, Oxycodone    Comment: no drug use since pregnancy-  NONE SINCE     Social History   Socioeconomic History   Marital status: Single    Spouse name: Not on file   Number of children: 1   Years of education: Not on file   Highest education level: Not on file  Occupational History   Not  on file  Tobacco Use   Smoking status: Every Day    Types: E-cigarettes   Smokeless tobacco: Never  Vaping Use   Vaping status: Every Day  Substance and Sexual Activity   Alcohol use: No    Comment: occ   Drug use: No    Types: Benzodiazepines, Marijuana, Oxycodone     Comment: no drug use since pregnancy-  NONE SINCE    Sexual activity: Not Currently    Birth control/protection: Implant  Other Topics Concern   Not on file  Social History Narrative   Right handed   One story home   Drinks caffeine    Social Drivers of Health   Financial Resource Strain: Low Risk  (12/16/2021)   Overall Financial Resource Strain (CARDIA)    Difficulty of Paying Living Expenses: Not hard at all  Food Insecurity: No Food Insecurity (02/05/2024)   Hunger Vital Sign    Worried About Running Out of Food in the Last Year: Never true    Ran Out of Food in the Last Year: Never true  Transportation Needs: No Transportation Needs (02/05/2024)   PRAPARE - Administrator, Civil Service (Medical): No    Lack of Transportation (Non-Medical): No  Physical Activity: Sufficiently Active (12/16/2021)   Exercise Vital Sign    Days of Exercise per Week: 7 days  Minutes of Exercise per Session: 60 min  Stress: No Stress Concern Present (12/16/2021)   [employer] of Occupational Health - Occupational Stress Questionnaire    Feeling of Stress : Only a little  Social Connections: Moderately Isolated (12/16/2021)   Social Connection and Isolation Panel    Frequency of Communication with Friends and Family: More than three times a week    Frequency of Social Gatherings with Friends and Family: Once a week    Attends Religious Services: More than 4 times per year    Active Member of [employer] or Organizations: No    Attends Banker Meetings: Never    Marital Status: Never married   Past Medical History:  Past Medical History:  Diagnosis Date   Abnormal Pap smear    GBS (group B  streptococcus) UTI complicating pregnancy 04/17/2012   IC (interstitial cystitis) 04/17/2012   Interstitial cystitis    Ovarian cyst    Protein in urine    Renal abscess    Seizures (HCC)     Past Surgical History:  Procedure Laterality Date   DILATION AND CURETTAGE OF UTERUS     IR FLUORO GUIDED NEEDLE PLC ASPIRATION/INJECTION LOC  01/03/2020    Current Medications: Current Facility-Administered Medications  Medication Dose Route Frequency Provider Last Rate Last Admin   acetaminophen  (TYLENOL ) tablet 650 mg  650 mg Oral Q6H PRN Ajibola, Ene A, NP   650 mg at 02/10/24 1354   alum & mag hydroxide-simeth (MAALOX/MYLANTA) 200-200-20 MG/5ML suspension 30 mL  30 mL Oral Q4H PRN Ajibola, Ene A, NP   30 mL at 02/09/24 1611   haloperidol  (HALDOL ) tablet 5 mg  5 mg Oral TID PRN Ajibola, Ene A, NP   5 mg at 02/05/24 2303   And   diphenhydrAMINE  (BENADRYL ) capsule 50 mg  50 mg Oral TID PRN Ajibola, Ene A, NP   50 mg at 02/05/24 2303   haloperidol  lactate (HALDOL ) injection 5 mg  5 mg Intramuscular TID PRN Ajibola, Ene A, NP       And   diphenhydrAMINE  (BENADRYL ) injection 50 mg  50 mg Intramuscular TID PRN Ajibola, Ene A, NP       And   LORazepam  (ATIVAN ) injection 2 mg  2 mg Intramuscular TID PRN Ajibola, Ene A, NP       haloperidol  lactate (HALDOL ) injection 10 mg  10 mg Intramuscular TID PRN Ajibola, Ene A, NP       And   diphenhydrAMINE  (BENADRYL ) injection 50 mg  50 mg Intramuscular TID PRN Ajibola, Ene A, NP       And   LORazepam  (ATIVAN ) injection 2 mg  2 mg Intramuscular TID PRN Ajibola, Ene A, NP       hydrOXYzine  (ATARAX ) tablet 25 mg  25 mg Oral TID PRN Ajibola, Ene A, NP       magnesium  hydroxide (MILK OF MAGNESIA) suspension 30 mL  30 mL Oral Daily PRN Ajibola, Ene A, NP       methadone  (DOLOPHINE ) 10 MG/ML solution 145 mg  145 mg Oral Daily Jadapalle, Sree, MD   145 mg at 02/11/24 0755   nicotine  (NICODERM CQ  - dosed in mg/24 hours) patch 14 mg  14 mg Transdermal Daily Jadapalle,  Sree, MD   14 mg at 02/11/24 0803   ondansetron  (ZOFRAN -ODT) disintegrating tablet 4 mg  4 mg Oral Q8H PRN Trudy Carwin, NP   4 mg at 02/11/24 0802   traZODone  (DESYREL ) tablet 50 mg  50  mg Oral QHS PRN Ajibola, Ene A, NP   50 mg at 02/09/24 2121    Lab Results: No results found for this or any previous visit (from the past 48 hours).  Blood Alcohol level:  Lab Results  Component Value Date   [facility] <15 02/04/2024    Metabolic Disorder Labs: Lab Results  Component Value Date   HGBA1C 5.3 02/04/2024   MPG 105 02/04/2024   MPG 111 01/01/2020   No results found for: PROLACTIN Lab Results  Component Value Date   CHOL 131 02/04/2024   TRIG 41 02/04/2024   HDL 41 02/04/2024   CHOLHDL 3.2 02/04/2024   VLDL 8 02/04/2024   LDLCALC 82 02/04/2024   LDLCALC 111 (H) 01/01/2020    Physical Findings: AIMS:  , ,  ,  ,    CIWA:    COWS:      Psychiatric Specialty Exam:  Presentation  General Appearance:  Disheveled  Eye Contact: Fair  Speech: Clear and Coherent  Speech Volume: Decreased    Mood and Affect  Mood: Depressed  Affect: Appropriate    Thought Process  Thought Processes: Coherent  Orientation:Full (Time, Place and Person)  Thought Content:WDL  Hallucinations: None Ideas of Reference:None  Suicidal Thoughts: No Homicidal Thoughts: No  Sensorium  Memory: Immediate Fair; Recent Fair  Judgment: Poor  Insight: Poor   Art Therapist  Concentration: Fair  Attention Span: Fair  Recall: [employer] of Knowledge: Fair  Language: Fair   Psychomotor Activity  Psychomotor Activity: Normal  Musculoskeletal: Strength & Muscle Tone: within normal limits Gait & Station: normal Assets  Assets: Manufacturing Systems Engineer; Housing; Social Support    Physical Exam: Physical Exam ROS Blood pressure 101/65, pulse 61, temperature 98.5 F (36.9 C), temperature source Oral, resp. rate 17, height 5' 5 (1.651 m), weight 70.3 kg, SpO2  100%. Body mass index is 25.79 kg/m.  Diagnosis: Principal Problem:   Psychosis, unspecified psychosis type (HCC) Active Problems:   Polysubstance abuse (HCC)   PLAN: Safety and Monitoring:  -- Voluntary admission to inpatient psychiatric unit for safety, stabilization and treatment  -- Daily contact with patient to assess and evaluate symptoms and progress in treatment  -- Patient's case to be discussed in multi-disciplinary team meeting  -- Observation Level : q15 minute checks  -- Vital signs:  q12 hours  -- Precautions: suicide, elopement, and assault -- Encouraged patient to participate in unit milieu and in scheduled group therapies  2. Psychiatric Treatment:  Scheduled Medications:  Methadone  continued, dose confirmed with [facility], last dose with [facility] 01/28/24     -- The risks/benefits/side-effects/alternatives to this medication were discussed in detail with the patient and time was given for questions. The patient consents to medication trial.  3. Medical Issues Being Addressed:  No acute concerns.    4. Discharge Planning:   -- Social work and case management to assist with discharge planning and identification of hospital follow-up needs prior to discharge  -- Estimated LOS: 5-7 days  Amber Sharps, NP This note was created using [employer]. Please excuse any inadvertent transcription errors. Case was discussed with supervising physician Dr. Jadapalle who is agreeable with current plan.

## 2024-02-11 NOTE — Group Note (Signed)
 Date:  02/11/2024 Time:  10:06 AM  Group Topic/Focus:  Goals Group:   The focus of this group is to help patients establish daily goals to achieve during treatment and discuss how the patient can incorporate goal setting into their daily lives to aide in recovery.    Participation Level:  Did Not Attend  Leigh VEAR Pais 02/11/2024, 10:06 AM

## 2024-02-11 NOTE — Progress Notes (Signed)
   02/10/24 2100  Psych Admission Type (Psych Patients Only)  Admission Status Involuntary  Psychosocial Assessment  Patient Complaints None  Eye Contact Fair  Facial Expression Animated  Affect Appropriate to circumstance  Speech Logical/coherent  Interaction Minimal  Motor Activity Other (Comment) (WDL)  Appearance/Hygiene Unremarkable  Behavior Characteristics Cooperative;Appropriate to situation  Mood Pleasant  Thought Process  Coherency WDL  Content WDL  Delusions None reported or observed  Perception WDL  Hallucination None reported or observed  Judgment Limited  Confusion None  Danger to Self  Current suicidal ideation? Denies  Agreement Not to Harm Self Yes  Description of Agreement Verbal  Danger to Others  Danger to Others None reported or observed

## 2024-02-12 MED ORDER — METHADONE HCL 10 MG/ML PO CONC
145.0000 mg | Freq: Every day | ORAL | Status: AC
  Administered 2024-02-13: 145 mg via ORAL
  Filled 2024-02-12: qty 15

## 2024-02-12 NOTE — Group Note (Signed)
 Recreation Therapy Group Note   Group Topic:Coping Skills  Group Date: 02/12/2024 Start Time: 1040 End Time: 1120 Facilitators: Celestia Jeoffrey BRAVO, LRT, CTRS Location: Craft Room  Group Description: Mind Map.  Patient was provided a blank template of a diagram with 32 blank boxes in a tiered system, branching from the center (similar to a bubble chart). LRT directed patients to label the middle of the diagram Coping Skills. LRT and patients then came up with 8 different coping skills as examples. Pt were directed to record their coping skills in the 2nd tier boxes closest to the center.  Patients would then share their coping skills with the group as LRT wrote them out. LRT gave a handout of 99 different coping skills at the end of group.   Goal Area(s) Addressed: Patients will be able to define "coping skills". Patient will identify new coping skills.  Patient will increase communication.   Affect/Mood: Euthymic   Participation Level: Minimal    Clinical Observations/Individualized Feedback: Mabel was in and out of group.   Plan: Continue to engage patient in RT group sessions 2-3x/week.   Jeoffrey BRAVO Celestia, LRT, CTRS 02/12/2024 11:34 AM

## 2024-02-12 NOTE — BHH Suicide Risk Assessment (Signed)
 [facility] Discharge Suicide Risk Assessment   Principal Problem: Psychosis, unspecified psychosis type (HCC) Discharge Diagnoses: Principal Problem:   Psychosis, unspecified psychosis type (HCC) Active Problems:   Polysubstance abuse (HCC)   Total Time spent with patient: 30 minutes  Musculoskeletal: Strength & Muscle Tone: within normal limits Gait & Station: normal Patient leans: N/A  Psychiatric Specialty Exam  Presentation  General Appearance:  Appropriate for Environment  Eye Contact: Good  Speech: Clear and Coherent  Speech Volume: Normal  Handedness:No data recorded  Mood and Affect  Mood: Euthymic  Duration of Depression Symptoms: No data recorded Affect: Appropriate   Thought Process  Thought Processes: Coherent  Descriptions of Associations:Intact  Orientation:Full (Time, Place and Person)  Thought Content:WDL  History of Schizophrenia/Schizoaffective disorder:No  Duration of Psychotic Symptoms:Less than six months  Hallucinations:Hallucinations: None  Ideas of Reference:None  Suicidal Thoughts:Suicidal Thoughts: No  Homicidal Thoughts:Homicidal Thoughts: No   Sensorium  Memory: Immediate Fair; Recent Fair  Judgment: Fair  Insight: Fair   Art Therapist  Concentration: Fair  Attention Span: Fair  Recall: [employer] of Knowledge: Fair  Language: Fair   Psychomotor Activity  Psychomotor Activity: Normal  Assets  Assets: Manufacturing Systems Engineer; Desire for Improvement; Housing; Social Support   Sleep  Sleep:Sleep: Good  Estimated Sleeping Duration (Last 24 Hours): 9.50-10.75 hours  Physical Exam: Physical Exam ROS Blood pressure 104/60, pulse (!) 52, temperature 98.4 F (36.9 C), temperature source Oral, resp. rate 20, height 5' 5 (1.651 m), weight 70.3 kg, SpO2 98%. Body mass index is 25.79 kg/m.  Mental Status Per Nursing Assessment::   On Admission:  NA  Demographic Factors:   Caucasian  Loss Factors: Loss of significant relationship  Historical Factors: Impulsivity  Risk Reduction Factors:   Sense of responsibility to family, Living with another person, especially a relative, and Positive social support  Continued Clinical Symptoms:  Alcohol/Substance Abuse/Dependencies  Cognitive Features That Contribute To Risk:  None    Suicide Risk:  Minimal: No identifiable suicidal ideation.  Patients presenting with no risk factors but with morbid ruminations; may be classified as minimal risk based on the severity of the depressive symptoms   Follow-up Information     [facility].   Why: Please walk in for your treatment.  Hours are provided below.  When you return your substance use counselor will see you same day.  Monday through Friday 5AM to 10AM, Saturday 6-8:30AM and closed on Sunday. Contact information: [address] [place] [address] [phone]         [facility] Follow up.   Why: Appointment is scheduled for 02/19/2024 at [facility].  Appointment is virtual. Contact information: [address]  [address] [place] [address] [phone]                 Plan Of Care/Follow-up recommendations:  Activity:  as tolerated   Camelia LITTIE Lukes, PA-C 02/12/2024, 6:07 PM

## 2024-02-12 NOTE — Progress Notes (Signed)
  [facility] Adult Case Management Discharge Plan :  Will you be returning to the same living situation after discharge:  Yes,  Patient to return to her mother's home.  At discharge, do you have transportation home?: Yes,  Mother to provide transportation.  Do you have the ability to pay for your medications: Yes,  TRILLIUM TAILORED PLAN / TRILLIUM TAILORED PLAN  Release of information consent forms completed and in the chart;  Patient's signature needed at discharge.  Patient to Follow up at:  Follow-up Information     [facility] Follow up.   Why: Please walk in for your treatment.  Hours are provided below.  When you return your substance use counselor will see you same day.  Monday through Friday 5AM to 10AM, Saturday 6-8:30AM and closed on Sunday. Contact information: [address] [place] [address] [phone]         [facility] Follow up.   Why: Appointment is scheduled for 02/19/2024 at [facility].  Appointment is virtual. Contact information: [address]  [address] [place] [address] [phone]                 Next level of care provider has access to [facility] Link:no  Safety Planning and Suicide Prevention discussed: Chaney  [name], mother, [phone];     Has patient been referred to the Quitline?: Patient refused referral for treatment  Patient has been referred for addiction treatment: Yes, the patient will follow up with an outpatient provider for substance use disorder. Psychiatrist/APP: appointment made and Therapist: appointment made  Alveta CHRISTELLA Kerns, LCSW 02/12/2024, 3:06 PM

## 2024-02-12 NOTE — Group Note (Signed)
 Date:  02/12/2024 Time:  3:34 PM  Group Topic/Focus:  Crisis Planning:   The purpose of this group is to help patients create a crisis plan for use upon discharge or in the future, as needed.    Participation Level:  Did Not Attend   [name] Amber [name] 02/12/2024, 3:34 PM

## 2024-02-12 NOTE — Progress Notes (Cosign Needed Addendum)
 [facility] MD Progress Note  02/12/2024  11:59 AM Amber [name]  MRN:  [id number]   Subjective:  Chart reviewed, case discussed in multidisciplinary meeting, patient seen during rounds.   12/8: On interview today, patient is found resting in her room.  She sits up to engage in interview with provider.  She is noted to be pleasant, calm and cooperative, alert and oriented.  She is linear and logical on exam.  She denies current symptoms of depression or anxiety.  She denies SI/HI/plan and denies hallucinations.  She reports stable sleep and appetite.  She does not voice any concerns at this time.  She is tolerating medication regimen well without adverse effects. She is able to discuss coping skills, crisis resources, and support system. She denies access to firearms or other lethal means. Discussed with patient that she will receive methadone  dose tomorrow morning prior to hospital discharge and will need to follow up with [facility] the following day (Wednesday, 12/10) for ongoing treatment; patient verbalizes understanding.  Patient provided verbal consent for provider to discuss plan of care and discharge planning with patient's sister Amber [name] at [phone]. Amber did not express any safety concerns and is agreeable to patient's discharge tomorrow. Discussed with Dana that patient will receive methadone  dose tomorrow morning prior to hospital discharge and patient will need to follow up at [facility] the following day for ongoing treatment; Amber verbalizes understanding, all questions answered.  CSW Alveta Kerns spoke to patient's mother, Amber [name], today, and informed that mother is planning to pick patient up at 7:30 am tomorrow, stated mother knows about the 6 AM methadone  dose patient will receive prior to discharge, and plans to take the patient when they leave the hospital to the clinic to get established.    12/7: On interview today, patient is found  interacting in the milieu.  She is noted to be pleasant, calm and cooperative, alert and oriented.  She reports good sleep and appetite.  She denies current symptoms of depression or anxiety.  She denies SI/HI/plan and denies hallucinations.  She is able to discuss coping skills, crisis resources, and support system.  She is tolerating medication regimen well without adverse effects.  She denies any concerns or complaints at this time.  12/6: On interview today, patient is noted to be pleasant, calm and cooperative, alert and oriented.  She is noted to have a bright affect today.  She is linear and logical on exam.  She reports good sleep and improved appetite.  She denies current symptoms of depression and endorses situational anxiety.  She denies SI/HI/plan and denies hallucinations.  She is tolerating medication regimen well without adverse effects.  She does not voice any concerns or complaints at this time.   Past Psychiatric History: see h&P Family History:  Family History  Problem Relation Age of Onset   Cancer Maternal Uncle    Diabetes Maternal Grandmother    Cancer Maternal Grandmother        colon   Diabetes Paternal Grandmother    Cancer Paternal Grandmother        bone   Other Neg Hx    Social History:  Social History   Substance and Sexual Activity  Alcohol Use No   Comment: occ     Social History   Substance and Sexual Activity  Drug Use No   Types: Benzodiazepines, Marijuana, Oxycodone    Comment: no drug use since pregnancy-  NONE SINCE     Social  History   Socioeconomic History   Marital status: Single    Spouse name: Not on file   Number of children: 1   Years of education: Not on file   Highest education level: Not on file  Occupational History   Not on file  Tobacco Use   Smoking status: Every Day    Types: E-cigarettes   Smokeless tobacco: Never  Vaping Use   Vaping status: Every Day  Substance and Sexual Activity   Alcohol use: No    Comment:  occ   Drug use: No    Types: Benzodiazepines, Marijuana, Oxycodone     Comment: no drug use since pregnancy-  NONE SINCE    Sexual activity: Not Currently    Birth control/protection: Implant  Other Topics Concern   Not on file  Social History Narrative   Right handed   One story home   Drinks caffeine    Social Drivers of Health   Financial Resource Strain: Low Risk  (12/16/2021)   Overall Financial Resource Strain (CARDIA)    Difficulty of Paying Living Expenses: Not hard at all  Food Insecurity: No Food Insecurity (02/05/2024)   Hunger Vital Sign    Worried About Running Out of Food in the Last Year: Never true    Ran Out of Food in the Last Year: Never true  Transportation Needs: No Transportation Needs (02/05/2024)   PRAPARE - Administrator, Civil Service (Medical): No    Lack of Transportation (Non-Medical): No  Physical Activity: Sufficiently Active (12/16/2021)   Exercise Vital Sign    Days of Exercise per Week: 7 days    Minutes of Exercise per Session: 60 min  Stress: No Stress Concern Present (12/16/2021)   [employer] of Occupational Health - Occupational Stress Questionnaire    Feeling of Stress : Only a little  Social Connections: Moderately Isolated (12/16/2021)   Social Connection and Isolation Panel    Frequency of Communication with Friends and Family: More than three times a week    Frequency of Social Gatherings with Friends and Family: Once a week    Attends Religious Services: More than 4 times per year    Active Member of [employer] or Organizations: No    Attends Banker Meetings: Never    Marital Status: Never married   Past Medical History:  Past Medical History:  Diagnosis Date   Abnormal Pap smear    GBS (group B streptococcus) UTI complicating pregnancy 04/17/2012   IC (interstitial cystitis) 04/17/2012   Interstitial cystitis    Ovarian cyst    Protein in urine    Renal abscess    Seizures (HCC)     Past Surgical  History:  Procedure Laterality Date   DILATION AND CURETTAGE OF UTERUS     IR FLUORO GUIDED NEEDLE PLC ASPIRATION/INJECTION LOC  01/03/2020    Current Medications: Current Facility-Administered Medications  Medication Dose Route Frequency Provider Last Rate Last Admin   acetaminophen  (TYLENOL ) tablet 650 mg  650 mg Oral Q6H PRN Ajibola, Ene A, NP   650 mg at 02/11/24 2114   alum & mag hydroxide-simeth (MAALOX/MYLANTA) 200-200-20 MG/5ML suspension 30 mL  30 mL Oral Q4H PRN Ajibola, Ene A, NP   30 mL at 02/09/24 1611   haloperidol  (HALDOL ) tablet 5 mg  5 mg Oral TID PRN Ajibola, Ene A, NP   5 mg at 02/05/24 2303   And   diphenhydrAMINE  (BENADRYL ) capsule 50 mg  50 mg Oral TID PRN Ajibola,  Ene A, NP   50 mg at 02/05/24 2303   haloperidol  lactate (HALDOL ) injection 5 mg  5 mg Intramuscular TID PRN Ajibola, Ene A, NP       And   diphenhydrAMINE  (BENADRYL ) injection 50 mg  50 mg Intramuscular TID PRN Ajibola, Ene A, NP       And   LORazepam  (ATIVAN ) injection 2 mg  2 mg Intramuscular TID PRN Ajibola, Ene A, NP       haloperidol  lactate (HALDOL ) injection 10 mg  10 mg Intramuscular TID PRN Ajibola, Ene A, NP       And   diphenhydrAMINE  (BENADRYL ) injection 50 mg  50 mg Intramuscular TID PRN Ajibola, Ene A, NP       And   LORazepam  (ATIVAN ) injection 2 mg  2 mg Intramuscular TID PRN Ajibola, Ene A, NP       hydrOXYzine  (ATARAX ) tablet 25 mg  25 mg Oral TID PRN Ajibola, Ene A, NP       magnesium  hydroxide (MILK OF MAGNESIA) suspension 30 mL  30 mL Oral Daily PRN Ajibola, Ene A, NP       methadone  (DOLOPHINE ) 10 MG/ML solution 145 mg  145 mg Oral Daily Jadapalle, Sree, MD   145 mg at 02/12/24 9078   nicotine  (NICODERM CQ  - dosed in mg/24 hours) patch 14 mg  14 mg Transdermal Daily Jadapalle, Sree, MD   14 mg at 02/11/24 0803   ondansetron  (ZOFRAN -ODT) disintegrating tablet 4 mg  4 mg Oral Q8H PRN Trudy Carwin, NP   4 mg at 02/12/24 9061   traZODone  (DESYREL ) tablet 50 mg  50 mg Oral QHS PRN  Ajibola, Ene A, NP   50 mg at 02/09/24 2121    Lab Results: No results found for this or any previous visit (from the past 48 hours).  Blood Alcohol level:  Lab Results  Component Value Date   [facility] <15 02/04/2024    Metabolic Disorder Labs: Lab Results  Component Value Date   HGBA1C 5.3 02/04/2024   MPG 105 02/04/2024   MPG 111 01/01/2020   No results found for: PROLACTIN Lab Results  Component Value Date   CHOL 131 02/04/2024   TRIG 41 02/04/2024   HDL 41 02/04/2024   CHOLHDL 3.2 02/04/2024   VLDL 8 02/04/2024   LDLCALC 82 02/04/2024   LDLCALC 111 (H) 01/01/2020    Physical Findings: AIMS:  , ,  ,  ,    CIWA:    COWS:      Psychiatric Specialty Exam:  Presentation  General Appearance:  Casual  Eye Contact: Fair  Speech: Clear and Coherent  Speech Volume: Normal    Mood and Affect  Mood: Euthymic  Affect: Appropriate    Thought Process  Thought Processes: Coherent  Orientation:Full (Time, Place and Person)  Thought Content:WDL  Hallucinations: None Ideas of Reference:None  Suicidal Thoughts: No Homicidal Thoughts: No  Sensorium  Memory: Immediate Fair; Recent Fair  Judgment: Fair  Insight: Fair   Art Therapist  Concentration: Fair  Attention Span: Fair  Recall: [employer] of Knowledge: Fair  Language: Fair   Psychomotor Activity  Psychomotor Activity: Normal  Musculoskeletal: Strength & Muscle Tone: within normal limits Gait & Station: normal Assets  Assets: Manufacturing Systems Engineer; Housing; Social Support    Physical Exam: Physical Exam ROS Blood pressure 99/67, pulse 78, temperature 98.4 F (36.9 C), temperature source Oral, resp. rate 20, height 5' 5 (1.651 m), weight 70.3 kg, SpO2 99%. Body mass  index is 25.79 kg/m.  Diagnosis: Principal Problem:   Psychosis, unspecified psychosis type (HCC) Active Problems:   Polysubstance abuse (HCC)   PLAN: Safety and Monitoring:  -- Voluntary  admission to inpatient psychiatric unit for safety, stabilization and treatment  -- Daily contact with patient to assess and evaluate symptoms and progress in treatment  -- Patient's case to be discussed in multi-disciplinary team meeting  -- Observation Level : q15 minute checks  -- Vital signs:  q12 hours  -- Precautions: suicide, elopement, and assault -- Encouraged patient to participate in unit milieu and in scheduled group therapies  2. Psychiatric Treatment:  Scheduled Medications:  Methadone  continued, dose confirmed with [facility], last dose with [facility] 01/28/24     -- The risks/benefits/side-effects/alternatives to this medication were discussed in detail with the patient and time was given for questions. The patient consents to medication trial.  3. Medical Issues Being Addressed:  No acute concerns.    4. Discharge Planning:             -- Likely Tuesday  -- Social work and case management to assist with discharge planning and identification of hospital follow-up needs prior to discharge  -- Estimated LOS: 5-7 days  [employer], PA-C This note was created using Scientist, clinical (histocompatibility and immunogenetics). Please excuse any inadvertent transcription errors. Case was discussed with attending physician Dr. Jadapalle who is agreeable with current plan.

## 2024-02-12 NOTE — Group Note (Signed)
 [facility] LCSW Group Therapy Note   Group Date: 02/12/2024 Start Time: 1300 End Time: 1400   Type of Therapy/Topic:  Group Therapy:  Balance in Life  Participation Level:  Did Not Attend   Description of Group:    This group will address the concept of balance and how it feels and looks when one is unbalanced. Patients will be encouraged to process areas in their lives that are out of balance, and identify reasons for remaining unbalanced. Facilitators will guide patients utilizing problem- solving interventions to address and correct the stressor making their life unbalanced. Understanding and applying boundaries will be explored and addressed for obtaining  and maintaining a balanced life. Patients will be encouraged to explore ways to assertively make their unbalanced needs known to significant others in their lives, using other group members and facilitator for support and feedback.  Therapeutic Goals: Patient will identify two or more emotions or situations they have that consume much of in their lives. Patient will identify signs/triggers that life has become out of balance:  Patient will identify two ways to set boundaries in order to achieve balance in their lives:  Patient will demonstrate ability to communicate their needs through discussion and/or role plays  Summary of Patient Progress:    Patient did not attend.     Therapeutic Modalities:   Cognitive Behavioral Therapy Solution-Focused Therapy Assertiveness Training   Amber CHRISTELLA Kerns, LCSW

## 2024-02-12 NOTE — Group Note (Signed)
 Recreation Therapy Group Note   Group Topic:Other  Group Date: 02/12/2024 Start Time: 1515 End Time: 1605 Facilitators: Celestia Jeoffrey FORBES ARTICE, CTRS Location: Craft Room  Activity Description/Intervention: Therapeutic Drumming. Patients with peers and staff were given the opportunity to engage in a leader facilitated [facility] Group Empowerment Drumming Circle with staff from the [employer], in partnership with [employer]. Teaching laboratory technician and trained [place], Norleen Mon leading with LRT observing and documenting intervention and pt response. This evidenced-based practice targets 7 areas of health and wellbeing in the human experience including: stress-reduction, exercise, self-expression, camaraderie/support, nurturing, spirituality, and music-making (leisure).    Goal Area(s) Addresses:  Patient will engage in pro-social way in music group.  Patient will follow directions of drum leader on the first prompt. Patient will demonstrate no behavioral issues during group.  Patient will identify if a reduction in stress level occurs as a result of participation in therapeutic drum circle.     Affect/Mood: N/A   Participation Level: Minimal    Clinical Observations/Individualized Feedback: Marien was present in group for 5 minutes.   Plan: Continue to engage patient in RT group sessions 2-3x/week.   Jeoffrey FORBES Celestia, LRT, CTRS 02/12/2024 5:14 PM

## 2024-02-12 NOTE — Group Note (Signed)
 Date:  02/12/2024 Time:  8:46 PM  Group Topic/Focus:  Orientation:   The focus of this group is to educate the patient on the purpose and policies of crisis stabilization and provide a format to answer questions about their admission.  The group details unit policies and expectations of patients while admitted.    Participation Level:  Active  Participation Quality:  Appropriate and Sharing  Affect:  Appropriate  Cognitive:  Alert and Appropriate  Insight: Appropriate, Good, and Improving  Engagement in Group:  Developing/Improving and Engaged  Modes of Intervention:  Clarification, Discussion, Orientation, Rapport Building, and Support  Additional Comments:     Telissa Palmisano 02/12/2024, 8:46 PM

## 2024-02-12 NOTE — BHH Counselor (Signed)
 CSW touched base with patient's mother to engage in safe discharge planning. Mother confirmed that she will provide transportation to the patient at discharge and plans to be here at 7:30 AM. CSW reviewed patient's follow up plan with mother.    This has been communicated to team.   Alveta Kerns, MSW, LCSWA 02/12/2024 3:05 PM

## 2024-02-12 NOTE — Progress Notes (Signed)
   02/12/24 0930  Psych Admission Type (Psych Patients Only)  Admission Status Involuntary  Psychosocial Assessment  Patient Complaints None (patient had no complaints)  Eye Contact Fair  Facial Expression Anxious  Affect Appropriate to circumstance  Speech Logical/coherent  Interaction Assertive  Motor Activity Slow  Appearance/Hygiene Unremarkable  Behavior Characteristics Cooperative;Appropriate to situation  Mood Preoccupied;Pleasant  Aggressive Behavior  Effect No apparent injury  Thought Process  Coherency WDL  Content WDL  Delusions None reported or observed  Perception WDL  Hallucination None reported or observed  Judgment WDL  Confusion None  Danger to Self  Current suicidal ideation? Denies  Self-Injurious Behavior No self-injurious ideation or behavior indicators observed or expressed   Agreement Not to Harm Self Yes  Description of Agreement Verbal  Danger to Others  Danger to Others None reported or observed   Patient's goal for today, per her self-inventory is establishing stability/long-term goals, in which she will help myself and others in order to achieve her goal.

## 2024-02-13 NOTE — Progress Notes (Signed)
   02/13/24 0400  Psych Admission Type (Psych Patients Only)  Admission Status Involuntary  Psychosocial Assessment  Patient Complaints None  Eye Contact Fair  Facial Expression Anxious  Affect Appropriate to circumstance  Speech Logical/coherent  Interaction Assertive  Motor Activity Slow  Appearance/Hygiene Unremarkable  Behavior Characteristics Cooperative  Mood Suspicious;Preoccupied;Pleasant  Aggressive Behavior  Effect No apparent injury  Thought Process  Coherency WDL  Content WDL  Delusions WDL  Perception WDL  Hallucination None reported or observed  Judgment WDL  Confusion WDL  Danger to Self  Current suicidal ideation? Denies  Danger to Others  Danger to Others None reported or observed

## 2024-02-13 NOTE — Discharge Summary (Signed)
 Physician Discharge Summary Note  Patient:  Amber [name] is an 33 y.o., female MRN:  [id number] DOB:  1990-12-26 Patient phone:  [phone] (home)  Patient address:   [address] [place] [address],   Total time spent: 40 min Date of Admission:  02/05/2024 Date of Discharge: 02/13/2024  Reason for Admission:  Psychosis  Principal Problem: Psychosis, unspecified psychosis type [facility]) Discharge Diagnoses: Principal Problem:   Psychosis, unspecified psychosis type (HCC) Active Problems:   Polysubstance abuse (HCC)   Past Psychiatric History: see h&p  Family Psychiatric  History: see h&p Social History:  Social History   Substance and Sexual Activity  Alcohol Use No   Comment: occ     Social History   Substance and Sexual Activity  Drug Use No   Types: Benzodiazepines, Marijuana, Oxycodone    Comment: no drug use since pregnancy-  NONE SINCE     Social History   Socioeconomic History   Marital status: Single    Spouse name: Not on file   Number of children: 1   Years of education: Not on file   Highest education level: Not on file  Occupational History   Not on file  Tobacco Use   Smoking status: Every Day    Types: E-cigarettes   Smokeless tobacco: Never  Vaping Use   Vaping status: Every Day  Substance and Sexual Activity   Alcohol use: No    Comment: occ   Drug use: No    Types: Benzodiazepines, Marijuana, Oxycodone     Comment: no drug use since pregnancy-  NONE SINCE    Sexual activity: Not Currently    Birth control/protection: Implant  Other Topics Concern   Not on file  Social History Narrative   Right handed   One story home   Drinks caffeine    Social Drivers of Health   Financial Resource Strain: Low Risk  (12/16/2021)   Overall Financial Resource Strain (CARDIA)    Difficulty of Paying Living Expenses: Not hard at all  Food Insecurity: No Food Insecurity (02/05/2024)   Hunger Vital Sign    Worried About Running Out of Food in  the Last Year: Never true    Ran Out of Food in the Last Year: Never true  Transportation Needs: No Transportation Needs (02/05/2024)   PRAPARE - Administrator, Civil Service (Medical): No    Lack of Transportation (Non-Medical): No  Physical Activity: Sufficiently Active (12/16/2021)   Exercise Vital Sign    Days of Exercise per Week: 7 days    Minutes of Exercise per Session: 60 min  Stress: No Stress Concern Present (12/16/2021)   [employer] of Occupational Health - Occupational Stress Questionnaire    Feeling of Stress : Only a little  Social Connections: Moderately Isolated (12/16/2021)   Social Connection and Isolation Panel    Frequency of Communication with Friends and Family: More than three times a week    Frequency of Social Gatherings with Friends and Family: Once a week    Attends Religious Services: More than 4 times per year    Active Member of [employer] or Organizations: No    Attends Banker Meetings: Never    Marital Status: Never married   Past Medical History:  Past Medical History:  Diagnosis Date   Abnormal Pap smear    GBS (group B streptococcus) UTI complicating pregnancy 04/17/2012   IC (interstitial cystitis) 04/17/2012   Interstitial cystitis    Ovarian cyst    Protein  in urine    Renal abscess    Seizures (HCC)     Past Surgical History:  Procedure Laterality Date   DILATION AND CURETTAGE OF UTERUS     IR FLUORO GUIDED NEEDLE PLC ASPIRATION/INJECTION LOC  01/03/2020   Family History:  Family History  Problem Relation Age of Onset   Cancer Maternal Uncle    Diabetes Maternal Grandmother    Cancer Maternal Grandmother        colon   Diabetes Paternal Grandmother    Cancer Paternal Grandmother        bone   Other Neg Hx     Hospital Course:   On admission, patient presented with suspected substance-induced psychosis. Throughout hospitalization, she demonstrated gradual improvement in symptoms without the need for  psychotropic medication, supporting diagnosis of substance-induced psychosis.  She consistently denied SI/HI/plan and denied hallucinations on serial interviews.  As discharge approached, patient was no longer displaying psychotic symptoms, and was linear, logical, and future oriented.  She was able discuss coping skills, crisis resources, and support system.  She maintained safe behaviors on the unit.  Patient's methadone  treatment was continued for this admission, and attending physician communicated with patient's methadone  clinic to coordinate care.  Patient, patient's mother, patient's sister engaged in safe discharge planning with provider and clinical social worker.  Detailed risk assessment is complete based on clinical exam and individual risk factors and acute suicide risk is low and acute violence risk is low.    On the day of discharge, patient denies SI/HI/plan and denies hallucinations.  Patient remains future oriented and is willing to participate in outpatient mental health services.  Currently, all modifiable risk of harm to self/harm to others have been addressed and patient is no longer appropriate for the acute inpatient setting and is able to continue treatment for mental health needs in the community with the supports as indicated below.  Patient is educated and verbalized understanding of discharge plan of care including medications, follow-up appointments, mental health resources and further crisis services in the community. She is instructed to call 911 or present to the nearest emergency room should she experience any decompensation in mood, disturbance of bowel or return of suicidal/homicidal ideations.  Patient verbalizes understanding of this education and agrees to this plan of care  Physical Findings: AIMS:  , ,  ,  ,    CIWA:    COWS:      Psychiatric Specialty Exam:  Presentation  General Appearance:  Appropriate for Environment  Eye  Contact: Good  Speech: Clear and Coherent  Speech Volume: Normal    Mood and Affect  Mood: Euthymic  Affect: Appropriate   Thought Process  Thought Processes: Coherent  Descriptions of Associations:Intact  Orientation:Full (Time, Place and Person)  Thought Content:WDL  Hallucinations:Hallucinations: None  Ideas of Reference:None  Suicidal Thoughts:Suicidal Thoughts: No  Homicidal Thoughts:Homicidal Thoughts: No   Sensorium  Memory: Immediate Fair; Recent Fair  Judgment: Fair  Insight: Fair   Art Therapist  Concentration: Fair  Attention Span: Fair  Recall: [employer] of Knowledge: Fair  Language: Fair   Psychomotor Activity  Psychomotor Activity: Normal Musculoskeletal: Strength & Muscle Tone: within normal limits Gait & Station: normal Assets  Assets: Manufacturing Systems Engineer; Desire for Improvement; Housing; Social Support   Sleep  Sleep: Sleep: Good    Physical Exam: Physical Exam Constitutional:      Appearance: Normal appearance.  HENT:     Head: Normocephalic and atraumatic.     Nose:  Nose normal.  Eyes:     Conjunctiva/sclera: Conjunctivae normal.  Pulmonary:     Effort: Pulmonary effort is normal.  Neurological:     Mental Status: She is alert and oriented to person, place, and time.  Psychiatric:        Attention and Perception: Attention and perception normal. She does not perceive auditory or visual hallucinations.        Mood and Affect: Mood and affect normal.        Speech: Speech normal.        Behavior: Behavior is cooperative.        Thought Content: Thought content is not paranoid or delusional. Thought content does not include homicidal or suicidal ideation. Thought content does not include homicidal or suicidal plan.        Cognition and Memory: Cognition normal.        Judgment: Judgment normal.    Review of Systems  Psychiatric/Behavioral:  Negative for depression, hallucinations and  suicidal ideas. The patient is not nervous/anxious and does not have insomnia.    Blood pressure 111/60, pulse 70, temperature 98.3 F (36.8 C), temperature source Oral, resp. rate 18, height 5' 5 (1.651 m), weight 70.3 kg, SpO2 99%. Body mass index is 25.79 kg/m.   Social History   Tobacco Use  Smoking Status Every Day   Types: E-cigarettes  Smokeless Tobacco Never   Tobacco Cessation:  A prescription for an FDA-approved tobacco cessation medication was offered at discharge and the patient refused   Blood Alcohol level:  Lab Results  Component Value Date   [facility] <15 02/04/2024    Metabolic Disorder Labs:  Lab Results  Component Value Date   HGBA1C 5.3 02/04/2024   MPG 105 02/04/2024   MPG 111 01/01/2020   No results found for: PROLACTIN Lab Results  Component Value Date   CHOL 131 02/04/2024   TRIG 41 02/04/2024   HDL 41 02/04/2024   CHOLHDL 3.2 02/04/2024   VLDL 8 02/04/2024   LDLCALC 82 02/04/2024   LDLCALC 111 (H) 01/01/2020    See Psychiatric Specialty Exam and Suicide Risk Assessment completed by Attending Physician prior to discharge.  Discharge destination:  Home  Is patient on multiple antipsychotic therapies at discharge:  No   Has Patient had three or more failed trials of antipsychotic monotherapy by history:  No  Recommended Plan for Multiple Antipsychotic Therapies: NA  Discharge Instructions     Diet - low sodium heart healthy   Complete by: As directed    Increase activity slowly   Complete by: As directed       Allergies as of 02/13/2024       Reactions   Biaxin [clarithromycin] Anaphylaxis        Medication List     TAKE these medications      Indication  methadone  10 MG/ML solution Commonly known as: DOLOPHINE  Take 145 mg by mouth daily.  Indication: Opioid Dependence        Follow-up Information     [facility].   Why: Please walk in for your treatment.  Hours are provided below.  When  you return your substance use counselor will see you same day.  Monday through Friday 5AM to 10AM, Saturday 6-8:30AM and closed on Sunday. Contact information: [address] [place] [address] [phone]         [facility] Follow up.   Why: Appointment is scheduled for 02/19/2024 at [facility].  Appointment is virtual.  Contact information: [address]  [address] [place] [address] [phone]                 Follow-up recommendations:  Activity:  as tolerated     Signed: Camelia LITTIE Lukes, PA-C 02/13/2024, 7:05 AM

## 2024-02-13 NOTE — Plan of Care (Signed)
   Problem: Education: Goal: Emotional status will improve Outcome: Progressing Goal: Mental status will improve Outcome: Progressing   Problem: Activity: Goal: Interest or engagement in activities will improve Outcome: Progressing Goal: Sleeping patterns will improve Outcome: Progressing

## 2024-02-13 NOTE — Plan of Care (Signed)
  Problem: Education: Goal: Knowledge of [facility] Education information/materials will improve Outcome: Adequate for Discharge Goal: Emotional status will improve 02/13/2024 0412 by Orlando Ozell LABOR, RN Outcome: Adequate for Discharge 02/13/2024 0412 by Orlando Ozell LABOR, RN Outcome: Progressing Goal: Mental status will improve 02/13/2024 0412 by Orlando Ozell LABOR, RN Outcome: Adequate for Discharge 02/13/2024 0412 by Orlando Ozell LABOR, RN Outcome: Progressing Goal: Verbalization of understanding the information provided will improve Outcome: Adequate for Discharge   Problem: Activity: Goal: Interest or engagement in activities will improve 02/13/2024 0412 by Orlando Ozell LABOR, RN Outcome: Adequate for Discharge 02/13/2024 [phone] by Orlando Ozell LABOR, RN Outcome: Progressing Goal: Sleeping patterns will improve 02/13/2024 0412 by Orlando Ozell LABOR, RN Outcome: Adequate for Discharge 02/13/2024 [phone] by Orlando Ozell LABOR, RN Outcome: Progressing   Problem: Coping: Goal: Ability to verbalize frustrations and anger appropriately will improve Outcome: Adequate for Discharge Goal: Ability to demonstrate self-control will improve Outcome: Adequate for Discharge   Problem: Health Behavior/Discharge Planning: Goal: Identification of resources available to assist in meeting health care needs will improve Outcome: Adequate for Discharge Goal: Compliance with treatment plan for underlying cause of condition will improve Outcome: Adequate for Discharge   Problem: Physical Regulation: Goal: Ability to maintain clinical measurements within normal limits will improve Outcome: Adequate for Discharge   Problem: Safety: Goal: Periods of time without injury will increase Outcome: Adequate for Discharge   Problem: Education: Goal: Ability to state activities that reduce stress will improve Outcome: Adequate for Discharge   Problem: Coping: Goal: Ability to identify  and develop effective coping behavior will improve Outcome: Adequate for Discharge   Problem: Self-Concept: Goal: Ability to identify factors that promote anxiety will improve Outcome: Adequate for Discharge Goal: Level of anxiety will decrease Outcome: Adequate for Discharge Goal: Ability to modify response to factors that promote anxiety will improve Outcome: Adequate for Discharge

## 2024-02-13 NOTE — [facility] Suicide Risk Assessment (Signed)
 [facility] Transition , Suicide risk assessment, AVS printed.

## 2024-02-13 NOTE — Progress Notes (Signed)
 Patient pleasant and cooperative on approach. Denies SI,HI and AVH. Verbalized understanding discharge instructions,prescriptions and follow up care.  All belongings returned from [employer]. Suicide safety plan filled by patient and placed in chart. Copy given to patient.Patient escorted out by staff and transported by family.
